# Patient Record
Sex: Female | Born: 1965
Health system: Southern US, Community
[De-identification: ages and names within clinical notes are randomized; demographics above are authoritative.]

## PROBLEM LIST (undated history)

## (undated) ENCOUNTER — Inpatient Hospital Stay (HOSPITAL_COMMUNITY): Payer: Self-pay

## (undated) DIAGNOSIS — K429 Umbilical hernia without obstruction or gangrene: Secondary | ICD-10-CM

## (undated) DIAGNOSIS — D219 Benign neoplasm of connective and other soft tissue, unspecified: Secondary | ICD-10-CM

## (undated) HISTORY — DX: Umbilical hernia without obstruction or gangrene: K42.9

---

## 2001-11-13 ENCOUNTER — Encounter: Payer: Self-pay | Admitting: Emergency Medicine

## 2001-11-13 ENCOUNTER — Emergency Department (HOSPITAL_COMMUNITY): Admission: EM | Admit: 2001-11-13 | Discharge: 2001-11-13 | Payer: Self-pay

## 2001-11-21 ENCOUNTER — Emergency Department (HOSPITAL_COMMUNITY): Admission: EM | Admit: 2001-11-21 | Discharge: 2001-11-21 | Payer: Self-pay | Admitting: Emergency Medicine

## 2002-07-25 ENCOUNTER — Emergency Department (HOSPITAL_COMMUNITY): Admission: EM | Admit: 2002-07-25 | Discharge: 2002-07-25 | Payer: Self-pay | Admitting: Emergency Medicine

## 2002-09-17 ENCOUNTER — Inpatient Hospital Stay (HOSPITAL_COMMUNITY): Admission: AD | Admit: 2002-09-17 | Discharge: 2002-09-17 | Payer: Self-pay | Admitting: *Deleted

## 2002-09-17 ENCOUNTER — Encounter: Payer: Self-pay | Admitting: *Deleted

## 2002-09-18 ENCOUNTER — Inpatient Hospital Stay (HOSPITAL_COMMUNITY): Admission: AD | Admit: 2002-09-18 | Discharge: 2002-09-18 | Payer: Self-pay | Admitting: Obstetrics and Gynecology

## 2002-09-19 ENCOUNTER — Inpatient Hospital Stay (HOSPITAL_COMMUNITY): Admission: AD | Admit: 2002-09-19 | Discharge: 2002-09-19 | Payer: Self-pay | Admitting: Obstetrics & Gynecology

## 2002-09-21 ENCOUNTER — Inpatient Hospital Stay (HOSPITAL_COMMUNITY): Admission: AD | Admit: 2002-09-21 | Discharge: 2002-09-21 | Payer: Self-pay | Admitting: Obstetrics and Gynecology

## 2002-09-26 ENCOUNTER — Encounter: Admission: RE | Admit: 2002-09-26 | Discharge: 2002-09-26 | Payer: Self-pay | Admitting: Obstetrics and Gynecology

## 2002-10-01 ENCOUNTER — Encounter: Admission: RE | Admit: 2002-10-01 | Discharge: 2002-10-01 | Payer: Self-pay | Admitting: Obstetrics and Gynecology

## 2002-10-15 ENCOUNTER — Encounter: Admission: RE | Admit: 2002-10-15 | Discharge: 2002-10-15 | Payer: Self-pay | Admitting: Obstetrics and Gynecology

## 2002-12-09 ENCOUNTER — Emergency Department (HOSPITAL_COMMUNITY): Admission: EM | Admit: 2002-12-09 | Discharge: 2002-12-09 | Payer: Self-pay | Admitting: Emergency Medicine

## 2003-02-03 ENCOUNTER — Inpatient Hospital Stay (HOSPITAL_COMMUNITY): Admission: AD | Admit: 2003-02-03 | Discharge: 2003-02-03 | Payer: Self-pay | Admitting: *Deleted

## 2003-02-04 ENCOUNTER — Inpatient Hospital Stay (HOSPITAL_COMMUNITY): Admission: AD | Admit: 2003-02-04 | Discharge: 2003-02-04 | Payer: Self-pay | Admitting: *Deleted

## 2003-02-15 ENCOUNTER — Inpatient Hospital Stay (HOSPITAL_COMMUNITY): Admission: AD | Admit: 2003-02-15 | Discharge: 2003-02-15 | Payer: Self-pay | Admitting: Obstetrics & Gynecology

## 2003-05-20 ENCOUNTER — Inpatient Hospital Stay (HOSPITAL_COMMUNITY): Admission: AD | Admit: 2003-05-20 | Discharge: 2003-05-20 | Payer: Self-pay | Admitting: Obstetrics

## 2003-07-29 ENCOUNTER — Inpatient Hospital Stay (HOSPITAL_COMMUNITY): Admission: AD | Admit: 2003-07-29 | Discharge: 2003-07-29 | Payer: Self-pay | Admitting: Obstetrics

## 2003-08-21 ENCOUNTER — Inpatient Hospital Stay (HOSPITAL_COMMUNITY): Admission: AD | Admit: 2003-08-21 | Discharge: 2003-08-21 | Payer: Self-pay | Admitting: Obstetrics & Gynecology

## 2003-08-23 ENCOUNTER — Inpatient Hospital Stay (HOSPITAL_COMMUNITY): Admission: AD | Admit: 2003-08-23 | Discharge: 2003-08-26 | Payer: Self-pay | Admitting: Obstetrics

## 2003-08-24 ENCOUNTER — Encounter (INDEPENDENT_AMBULATORY_CARE_PROVIDER_SITE_OTHER): Payer: Self-pay | Admitting: Specialist

## 2004-08-09 ENCOUNTER — Emergency Department (HOSPITAL_COMMUNITY): Admission: EM | Admit: 2004-08-09 | Discharge: 2004-08-09 | Payer: Self-pay | Admitting: Emergency Medicine

## 2004-09-03 ENCOUNTER — Emergency Department (HOSPITAL_COMMUNITY): Admission: EM | Admit: 2004-09-03 | Discharge: 2004-09-03 | Payer: Self-pay | Admitting: Emergency Medicine

## 2004-12-07 ENCOUNTER — Emergency Department (HOSPITAL_COMMUNITY): Admission: EM | Admit: 2004-12-07 | Discharge: 2004-12-07 | Payer: Self-pay | Admitting: Family Medicine

## 2006-04-29 ENCOUNTER — Emergency Department (HOSPITAL_COMMUNITY): Admission: EM | Admit: 2006-04-29 | Discharge: 2006-04-29 | Payer: Self-pay | Admitting: Emergency Medicine

## 2007-05-29 ENCOUNTER — Encounter: Admission: RE | Admit: 2007-05-29 | Discharge: 2007-05-29 | Payer: Self-pay | Admitting: Internal Medicine

## 2008-03-29 ENCOUNTER — Inpatient Hospital Stay (HOSPITAL_COMMUNITY): Admission: AD | Admit: 2008-03-29 | Discharge: 2008-03-29 | Payer: Self-pay | Admitting: Obstetrics & Gynecology

## 2008-08-01 ENCOUNTER — Ambulatory Visit (HOSPITAL_COMMUNITY): Admission: RE | Admit: 2008-08-01 | Discharge: 2008-08-01 | Payer: Self-pay | Admitting: Obstetrics

## 2009-06-30 ENCOUNTER — Emergency Department (HOSPITAL_COMMUNITY): Admission: EM | Admit: 2009-06-30 | Discharge: 2009-06-30 | Payer: Self-pay | Admitting: Emergency Medicine

## 2009-07-03 ENCOUNTER — Emergency Department (HOSPITAL_COMMUNITY)
Admission: EM | Admit: 2009-07-03 | Discharge: 2009-07-03 | Payer: Self-pay | Source: Home / Self Care | Admitting: Emergency Medicine

## 2009-07-03 ENCOUNTER — Emergency Department (HOSPITAL_COMMUNITY)
Admission: EM | Admit: 2009-07-03 | Discharge: 2009-07-03 | Payer: Self-pay | Source: Home / Self Care | Admitting: Family Medicine

## 2009-07-08 ENCOUNTER — Emergency Department (HOSPITAL_COMMUNITY)
Admission: EM | Admit: 2009-07-08 | Discharge: 2009-07-08 | Payer: Self-pay | Source: Home / Self Care | Admitting: Emergency Medicine

## 2010-01-31 ENCOUNTER — Encounter: Payer: Self-pay | Admitting: Obstetrics & Gynecology

## 2010-02-01 ENCOUNTER — Encounter: Payer: Self-pay | Admitting: Obstetrics

## 2010-03-15 ENCOUNTER — Inpatient Hospital Stay (INDEPENDENT_AMBULATORY_CARE_PROVIDER_SITE_OTHER)
Admission: RE | Admit: 2010-03-15 | Discharge: 2010-03-15 | Disposition: A | Discharge status: Home / Self Care | Payer: 59 | Source: Ambulatory Visit | Attending: Family Medicine | Admitting: Family Medicine

## 2010-03-15 DIAGNOSIS — M25519 Pain in unspecified shoulder: Secondary | ICD-10-CM

## 2010-03-15 DIAGNOSIS — M542 Cervicalgia: Secondary | ICD-10-CM

## 2010-03-28 LAB — POCT I-STAT, CHEM 8
BUN: 16 mg/dL (ref 6–23)
Creatinine, Ser: 1 mg/dL (ref 0.4–1.2)
Glucose, Bld: 99 mg/dL (ref 70–99)
HCT: 38 % (ref 36.0–46.0)
Hemoglobin: 12.9 g/dL (ref 12.0–15.0)
Hemoglobin: 13.3 g/dL (ref 12.0–15.0)
Potassium: 3.9 mEq/L (ref 3.5–5.1)
Potassium: 3.9 mEq/L (ref 3.5–5.1)
Sodium: 139 mEq/L (ref 135–145)
Sodium: 138 mEq/L (ref 135–145)

## 2010-03-28 LAB — DIFFERENTIAL
Basophils Absolute: 0 10*3/uL (ref 0.0–0.1)
Eosinophils Relative: 4 % (ref 0–5)
Lymphocytes Relative: 19 % (ref 12–46)
Lymphocytes Relative: 22 % (ref 12–46)
Lymphs Abs: 1.1 10*3/uL (ref 0.7–4.0)
Lymphs Abs: 1.3 10*3/uL (ref 0.7–4.0)
Monocytes Absolute: 0.4 10*3/uL (ref 0.1–1.0)
Monocytes Absolute: 0.4 10*3/uL (ref 0.1–1.0)
Monocytes Relative: 7 % (ref 3–12)
Monocytes Relative: 7 % (ref 3–12)
Neutrophils Relative %: 67 % (ref 43–77)
Neutrophils Relative %: 69 % (ref 43–77)

## 2010-03-28 LAB — URINALYSIS, ROUTINE W REFLEX MICROSCOPIC
Ketones, ur: NEGATIVE mg/dL
Leukocytes, UA: NEGATIVE
Protein, ur: NEGATIVE mg/dL
Urobilinogen, UA: 0.2 mg/dL (ref 0.0–1.0)
pH: 6 (ref 5.0–8.0)

## 2010-03-28 LAB — HEPATIC FUNCTION PANEL
AST: 22 U/L (ref 0–37)
Albumin: 3.7 g/dL (ref 3.5–5.2)
Alkaline Phosphatase: 56 U/L (ref 39–117)
Total Bilirubin: 0.6 mg/dL (ref 0.3–1.2)

## 2010-03-28 LAB — URINE MICROSCOPIC-ADD ON

## 2010-03-28 LAB — CBC
HCT: 35.5 % — ABNORMAL LOW (ref 36.0–46.0)
MCV: 92.1 fL (ref 78.0–100.0)
RDW: 12.1 % (ref 11.5–15.5)
WBC: 5.7 10*3/uL (ref 4.0–10.5)

## 2010-03-28 LAB — BASIC METABOLIC PANEL
BUN: 11 mg/dL (ref 6–23)
Creatinine, Ser: 0.91 mg/dL (ref 0.4–1.2)
Glucose, Bld: 88 mg/dL (ref 70–99)

## 2010-04-30 ENCOUNTER — Other Ambulatory Visit: Payer: Self-pay | Admitting: Family Medicine

## 2010-04-30 DIAGNOSIS — M5412 Radiculopathy, cervical region: Secondary | ICD-10-CM

## 2010-05-10 ENCOUNTER — Ambulatory Visit
Admission: RE | Admit: 2010-05-10 | Discharge: 2010-05-10 | Disposition: A | Discharge status: Home / Self Care | Payer: 59 | Source: Ambulatory Visit | Attending: Family Medicine | Admitting: Family Medicine

## 2010-05-10 DIAGNOSIS — M5412 Radiculopathy, cervical region: Secondary | ICD-10-CM

## 2010-05-13 ENCOUNTER — Other Ambulatory Visit (HOSPITAL_COMMUNITY)
Admission: RE | Admit: 2010-05-13 | Discharge: 2010-05-13 | Disposition: A | Discharge status: Home / Self Care | Payer: 59 | Source: Ambulatory Visit | Attending: Family Medicine | Admitting: Family Medicine

## 2010-05-13 ENCOUNTER — Other Ambulatory Visit: Payer: Self-pay | Admitting: Family Medicine

## 2010-05-13 DIAGNOSIS — Z124 Encounter for screening for malignant neoplasm of cervix: Secondary | ICD-10-CM | POA: Insufficient documentation

## 2010-05-13 DIAGNOSIS — Z1159 Encounter for screening for other viral diseases: Secondary | ICD-10-CM | POA: Insufficient documentation

## 2013-12-14 ENCOUNTER — Emergency Department (HOSPITAL_COMMUNITY): Payer: Medicaid Other

## 2013-12-14 ENCOUNTER — Emergency Department (HOSPITAL_COMMUNITY)
Admission: EM | Admit: 2013-12-14 | Discharge: 2013-12-14 | Disposition: A | Discharge status: Home / Self Care | Payer: Medicaid Other | Attending: Emergency Medicine | Admitting: Emergency Medicine

## 2013-12-14 ENCOUNTER — Encounter (HOSPITAL_COMMUNITY): Payer: Self-pay

## 2013-12-14 DIAGNOSIS — R102 Pelvic and perineal pain: Secondary | ICD-10-CM

## 2013-12-14 DIAGNOSIS — D259 Leiomyoma of uterus, unspecified: Secondary | ICD-10-CM | POA: Diagnosis not present

## 2013-12-14 DIAGNOSIS — Z3202 Encounter for pregnancy test, result negative: Secondary | ICD-10-CM | POA: Diagnosis not present

## 2013-12-14 LAB — URINALYSIS, ROUTINE W REFLEX MICROSCOPIC
Bilirubin Urine: NEGATIVE
GLUCOSE, UA: NEGATIVE mg/dL
Ketones, ur: NEGATIVE mg/dL
Nitrite: NEGATIVE
Protein, ur: NEGATIVE mg/dL
SPECIFIC GRAVITY, URINE: 1.018 (ref 1.005–1.030)
UROBILINOGEN UA: 1 mg/dL (ref 0.0–1.0)
pH: 6.5 (ref 5.0–8.0)

## 2013-12-14 LAB — PREGNANCY, URINE: PREG TEST UR: NEGATIVE

## 2013-12-14 LAB — URINE MICROSCOPIC-ADD ON

## 2013-12-14 MED ORDER — IBUPROFEN 800 MG PO TABS
800.0000 mg | ORAL_TABLET | Freq: Once | ORAL | Status: AC
  Administered 2013-12-14: 800 mg via ORAL
  Filled 2013-12-14: qty 1

## 2013-12-14 MED ORDER — ACETAMINOPHEN 325 MG PO TABS
650.0000 mg | ORAL_TABLET | Freq: Once | ORAL | Status: AC
  Administered 2013-12-14: 650 mg via ORAL

## 2013-12-14 NOTE — ED Notes (Signed)
Pt back from ultrasound.

## 2013-12-14 NOTE — ED Notes (Signed)
Patient is in restroom at this time

## 2013-12-14 NOTE — ED Notes (Signed)
Patient transported to Ultrasound 

## 2013-12-14 NOTE — ED Provider Notes (Signed)
CSN: [id number]     Arrival date & time 12/14/13  1004 History   First MD Initiated Contact with Patient 12/14/13 1008     Chief Complaint  Patient presents with  . Abdominal Pain     (Consider location/radiation/quality/duration/timing/severity/associated sxs/prior Treatment) HPI Comments: 48 year old female with 3 vaginal delivery history, no abdominal surgeries presents with left lower pelvic and suprapubic pain for 1 week. Patient has had urinary frequency. Mild discomfort with sexual intercourse, no new sexual partners or STDs, no abnormal discharge or odor. Pain is mild to moderate fairly constant ache, worse with palpation.  Patient is a 48 y.o. female presenting with abdominal pain. The history is provided by the patient.  Abdominal Pain Associated symptoms: no chest pain, no chills, no diarrhea, no dysuria, no fever, no nausea, no shortness of breath, no vaginal bleeding and no vomiting     History reviewed. No pertinent past medical history. History reviewed. No pertinent past surgical history. History reviewed. No pertinent family history. History  Substance Use Topics  . Smoking status: Never Smoker   . Smokeless tobacco: Not on file  . Alcohol Use: No   OB History    No data available     Review of Systems  Constitutional: Negative for fever and chills.  HENT: Negative for congestion.   Eyes: Negative for visual disturbance.  Respiratory: Negative for shortness of breath.   Cardiovascular: Negative for chest pain.  Gastrointestinal: Positive for abdominal pain. Negative for nausea, vomiting and diarrhea.  Genitourinary: Positive for frequency and pelvic pain. Negative for dysuria, flank pain and vaginal bleeding.  Musculoskeletal: Negative for back pain, neck pain and neck stiffness.  Skin: Negative for rash.  Neurological: Negative for light-headedness and headaches.      Allergies  Review of patient's allergies indicates no known allergies.  Home  Medications   Prior to Admission medications   Not on File   BP 126/72 mmHg  Pulse 58  Temp(Src) 97.9 F (36.6 C) (Oral)  Resp 18  SpO2 100%  LMP 12/09/2013 (Approximate) Physical Exam  Constitutional: She is oriented to person, place, and time. She appears well-developed and well-nourished.  HENT:  Head: Normocephalic and atraumatic.  Eyes: Conjunctivae are normal. Right eye exhibits no discharge. Left eye exhibits no discharge.  Neck: Normal range of motion. Neck supple. No tracheal deviation present.  Cardiovascular: Normal rate and regular rhythm.   Pulmonary/Chest: Effort normal and breath sounds normal.  Abdominal: Soft. She exhibits no distension. There is tenderness (left pelvic and suprapubic). There is no guarding.  Musculoskeletal: She exhibits no edema.  Neurological: She is alert and oriented to person, place, and time.  Skin: Skin is warm. No rash noted.  Psychiatric: She has a normal mood and affect.  Nursing note and vitals reviewed.   ED Course  Procedures (including critical care time) Labs Review Labs Reviewed  URINALYSIS, ROUTINE W REFLEX MICROSCOPIC - Abnormal; Notable for the following:    APPearance CLOUDY (*)    Hgb urine dipstick MODERATE (*)    Leukocytes, UA SMALL (*)    All other components within normal limits  URINE MICROSCOPIC-ADD ON - Abnormal; Notable for the following:    Squamous Epithelial / LPF FEW (*)    All other components within normal limits  PREGNANCY, URINE    Imaging Review US Transvaginal Non-ob  12/14/2013   CLINICAL DATA:  Acute left pelvic pain for 1 week  EXAM: TRANSABDOMINAL AND TRANSVAGINAL ULTRASOUND OF PELVIS  TECHNIQUE: Both transabdominal and transvaginal  ultrasound examinations of the pelvis were performed. Transabdominal technique was performed for global imaging of the pelvis including uterus, ovaries, adnexal regions, and pelvic cul-de-sac. It was necessary to proceed with endovaginal exam following the  transabdominal exam to visualize the uterus and adnexae in better detail.  COMPARISON:  07/08/2009, 05/25/2010  FINDINGS: Uterus  Measurements: 14 x 9.3 x 9.9 cm. Diffuse heterogeneous enlargement. Two heterogeneous dominant fibroids noted. 1 in the fundus measures 9.5 x 9.4 x 0.5 cm. This is the largest fibroid. Second measurable uterine fibroid is right fundal measuring 4.7 x 4.4 x 4.4 cm. These appear larger than on prior studies. Cervical nabothian cysts evident.  Endometrium  Thickness: Not visualized.  Obscured by the fibroids.  Right ovary  Measurements: 1.4 x 0.9 x 1.1 cm. Normal appearance/no adnexal mass.  Left ovary  Measurements: 3.9 x 1.5 x 3.3 cm. Grossly normal appearance. Only seen transabdominally.  Other findings  No free fluid.  IMPRESSION: Enlarged fibroid uterus. Uterine fibroids appear larger than the 2 comparison studies.  No other significant acute process by ultrasound.   Electronically Signed   By: Daryll Brod M.D.   On: 12/14/2013 12:16   US Pelvis Complete  12/14/2013   CLINICAL DATA:  Acute left pelvic pain for 1 week  EXAM: TRANSABDOMINAL AND TRANSVAGINAL ULTRASOUND OF PELVIS  TECHNIQUE: Both transabdominal and transvaginal ultrasound examinations of the pelvis were performed. Transabdominal technique was performed for global imaging of the pelvis including uterus, ovaries, adnexal regions, and pelvic cul-de-sac. It was necessary to proceed with endovaginal exam following the transabdominal exam to visualize the uterus and adnexae in better detail.  COMPARISON:  07/08/2009, 05/25/2010  FINDINGS: Uterus  Measurements: 14 x 9.3 x 9.9 cm. Diffuse heterogeneous enlargement. Two heterogeneous dominant fibroids noted. 1 in the fundus measures 9.5 x 9.4 x 0.5 cm. This is the largest fibroid. Second measurable uterine fibroid is right fundal measuring 4.7 x 4.4 x 4.4 cm. These appear larger than on prior studies. Cervical nabothian cysts evident.  Endometrium  Thickness: Not visualized.   Obscured by the fibroids.  Right ovary  Measurements: 1.4 x 0.9 x 1.1 cm. Normal appearance/no adnexal mass.  Left ovary  Measurements: 3.9 x 1.5 x 3.3 cm. Grossly normal appearance. Only seen transabdominally.  Other findings  No free fluid.  IMPRESSION: Enlarged fibroid uterus. Uterine fibroids appear larger than the 2 comparison studies.  No other significant acute process by ultrasound.   Electronically Signed   By: Daryll Brod M.D.   On: 12/14/2013 12:16     EKG Interpretation None      MDM   Final diagnoses:  Pelvic pain in female  Uterine leiomyoma, unspecified location   Well-appearing female with urinary and pelvic symptoms for 1 week. Benign abdominal exam. Plan for pelvic ultrasound, urinalysis, pain meds and pelvic exam. Discussed outpatient follow up with gynecology.  Results and differential diagnosis were discussed with the patient/parent/guardian. Close follow up outpatient was discussed, comfortable with the plan.   Medications  acetaminophen (TYLENOL) tablet 650 mg (650 mg Oral Given 12/14/13 1032)  ibuprofen (ADVIL,MOTRIN) tablet 800 mg (800 mg Oral Given 12/14/13 1241)    Filed Vitals:   12/14/13 1015 12/14/13 1239  BP: 125/60 126/72  Pulse: 65 58  Temp: 97.9 F (36.6 C) 97.9 F (36.6 C)  TempSrc: Oral Oral  Resp: 16 18  SpO2: 100% 100%    Final diagnoses:  Pelvic pain in female  Uterine leiomyoma, unspecified location  Mariea Clonts, MD 12/14/13 [phone]

## 2013-12-14 NOTE — Discharge Instructions (Signed)
If you were given medicines take as directed.  If you are on coumadin or contraceptives realize their levels and effectiveness is altered by many different medicines.  If you have any reaction (rash, tongues swelling, other) to the medicines stop taking and see a physician.   Please follow up as directed and return to the ER or see a physician for new or worsening symptoms.  Thank you. Filed Vitals:   12/14/13 1015  BP: 125/60  Pulse: 65  Temp: 97.9 F (36.6 C)  TempSrc: Oral  Resp: 16  SpO2: 100%

## 2013-12-14 NOTE — ED Notes (Addendum)
Pt c/o LLQ abdominal discomfort, "knot," and urinary frequency x 1 week.  Pain score 7/10.  Denies n/v/d and odor and discharge.  Sts discomfort w/ intercourse.  Denies new sexual partner.  Pt sts Hx of "fallen bladder."

## 2014-05-07 LAB — PROCEDURE REPORT - SCANNED: Pap: NEGATIVE

## 2014-05-14 ENCOUNTER — Other Ambulatory Visit: Payer: Self-pay | Admitting: Obstetrics

## 2014-05-14 DIAGNOSIS — D259 Leiomyoma of uterus, unspecified: Secondary | ICD-10-CM

## 2014-05-15 ENCOUNTER — Other Ambulatory Visit: Payer: Self-pay | Admitting: Obstetrics

## 2014-05-15 ENCOUNTER — Encounter: Payer: Self-pay | Admitting: Radiology

## 2014-05-15 ENCOUNTER — Ambulatory Visit
Admission: RE | Admit: 2014-05-15 | Discharge: 2014-05-15 | Disposition: A | Discharge status: Home / Self Care | Payer: Medicaid Other | Source: Ambulatory Visit | Attending: Obstetrics | Admitting: Obstetrics

## 2014-05-15 ENCOUNTER — Other Ambulatory Visit: Payer: Self-pay | Admitting: Interventional Radiology

## 2014-05-15 DIAGNOSIS — D259 Leiomyoma of uterus, unspecified: Secondary | ICD-10-CM

## 2014-05-15 DIAGNOSIS — D251 Intramural leiomyoma of uterus: Secondary | ICD-10-CM

## 2014-05-15 HISTORY — DX: Benign neoplasm of connective and other soft tissue, unspecified: D21.9

## 2014-05-15 MED ORDER — GADOBENATE DIMEGLUMINE 529 MG/ML IV SOLN
15.0000 mL | Freq: Once | INTRAVENOUS | Status: AC | PRN
  Administered 2014-05-15: 15 mL via INTRAVENOUS

## 2014-05-15 NOTE — Consult Note (Signed)
Chief Complaint: Chief Complaint  Patient presents with  . Advice Only    Consult for [place] for symptomatic uterine fibroids    Referring Physician(s): Maria Logan  History of Present Illness: Maria Logan is Logan 49 y.o. female referred by Dr. Ruthann Logan for consultation regarding her symptomatic uterine fibroids. She is diagnosed with uterine fibroids and December 2015. She's having primarily bulk symptoms, no significant abnormal uterine bleeding. She's had no previous fibroid surgeries.  Past Medical History  Diagnosis Date  . Fibroids     No past surgical history on file.  Allergies: Review of patient's allergies indicates no known allergies.  Medications: Prior to Admission medications   Not on File     No family history on file.  History   Social History  . Marital Status: Single    Spouse Name: N/Logan  . Number of Children: N/Logan  . Years of Education: N/Logan   Social History Main Topics  . Smoking status: Never Smoker   . Smokeless tobacco: Never Used  . Alcohol Use: No  . Drug Use: No  . Sexual Activity: Yes    Birth Control/ Protection: None   Other Topics Concern  . None   Social History Narrative    ECOG Status: 1 - Symptomatic but completely ambulatory  Review of Systems: Logan 12 point ROS discussed and pertinent positives are indicated in the HPI above.  All other systems are negative.  Review of Systems  All other systems reviewed and are negative.   Vital Signs: BP 93/63 mmHg  Pulse 133  Temp(Src) 98.3 F (36.8 C) (Oral)  Resp 14  Ht 5\' 6"  (1.676 m)  Wt 175 lb (79.379 kg)  BMI 28.26 kg/m2  SpO2 100%  LMP 05/07/2014 (Approximate)  Physical Exam  Constitutional: She is oriented to person, place, and time. She appears well-developed and well-nourished. No distress.  HENT:  Head: Normocephalic and atraumatic.  Eyes: EOM are normal. Right eye exhibits no discharge. Left eye exhibits no discharge. No scleral icterus.  Neck:  Normal range of motion.  Pulmonary/Chest: Effort normal. No stridor. No respiratory distress.  Abdominal: Soft. She exhibits no distension.  Musculoskeletal: Normal range of motion.  Neurological: She is alert and oriented to person, place, and time. She has normal reflexes. Coordination normal.  Skin: Skin is warm and dry. No rash noted. She is not diaphoretic. No erythema.  Psychiatric: She has Logan normal mood and affect. Her behavior is normal. Judgment and thought content normal.  Nursing note and vitals reviewed.   Mallampati Score:     Imaging: No results found.  Labs:  CBC: No results for input(s): WBC, HGB, HCT, PLT in the last 8760 hours.  COAGS: No results for input(s): INR, APTT in the last 8760 hours.  BMP: No results for input(s): NA, K, CL, CO2, GLUCOSE, BUN, CALCIUM, CREATININE, GFRNONAA, GFRAA in the last 8760 hours.  Invalid input(s): CMP  LIVER FUNCTION TESTS: No results for input(s): BILITOT, AST, ALT, ALKPHOS, PROT, ALBUMIN in the last 8760 hours.  TUMOR MARKERS: No results for input(s): AFPTM, CEA, CA199, CHROMGRNA in the last 8760 hours.  Assessment and Plan:  Based on her assessment thus far, I think it's likely that some if not all of her pelvic bulk symptoms are secondary to her enlarged uterus secondary to multiple fibroids. We  spent the majority of the consultation discussing the pathophysiology of uterine fibroids, natural history including involution post menopause, and treatment options. We discussed hysterectomy. We  discussed in detail the uterine fibroid embolization procedure, technique, anticipated benefits, possible risks and complications, time course of symptom resolution, and need for continued gynecologic follow-up. We discussed the statistics regarding  pain response post procedure. She seemed to understand and did ask appropriate questions. She was very motivated to proceed. Based on her workup thus far, I would only require pelvic MR with  contrast to best assess the exact position of all of her uterine fibroids, specifically to exclude pedunculated fibroids  on Logan narrow stalk , as well as to exclude any other pelvic pathology. Assuming this is unremarkable, we can set up elective uterine fibroid embolization at [facility] at her convenience, with plans for overnight admission for pain control.  Thank you for this interesting consult.  I greatly enjoyed meeting Maria Logan and look forward to participating in their care.  Signed: Nikitha Mode III, Maria Logan 05/15/2014, 2:16 PM   I spent Logan total of  30 Minutes   in face to face in clinical consultation, greater than 50% of which was counseling/coordinating care for symptomatic uterine fibroids.

## 2014-05-16 ENCOUNTER — Other Ambulatory Visit: Payer: Self-pay | Admitting: Interventional Radiology

## 2014-05-16 DIAGNOSIS — D251 Intramural leiomyoma of uterus: Secondary | ICD-10-CM

## 2014-05-26 ENCOUNTER — Other Ambulatory Visit: Payer: Self-pay | Admitting: Physician Assistant

## 2014-05-28 ENCOUNTER — Ambulatory Visit (HOSPITAL_COMMUNITY)
Admission: RE | Admit: 2014-05-28 | Discharge: 2014-05-28 | Disposition: A | Discharge status: Home / Self Care | Payer: Medicaid Other | Source: Ambulatory Visit | Attending: Interventional Radiology | Admitting: Interventional Radiology

## 2014-05-28 ENCOUNTER — Encounter (HOSPITAL_COMMUNITY): Payer: Self-pay

## 2014-05-28 DIAGNOSIS — D252 Subserosal leiomyoma of uterus: Secondary | ICD-10-CM | POA: Diagnosis present

## 2014-05-28 DIAGNOSIS — Z5309 Procedure and treatment not carried out because of other contraindication: Secondary | ICD-10-CM | POA: Diagnosis not present

## 2014-05-28 DIAGNOSIS — Z3201 Encounter for pregnancy test, result positive: Secondary | ICD-10-CM | POA: Insufficient documentation

## 2014-05-28 DIAGNOSIS — D250 Submucous leiomyoma of uterus: Secondary | ICD-10-CM | POA: Diagnosis not present

## 2014-05-28 DIAGNOSIS — D251 Intramural leiomyoma of uterus: Secondary | ICD-10-CM | POA: Insufficient documentation

## 2014-05-28 DIAGNOSIS — R102 Pelvic and perineal pain: Secondary | ICD-10-CM | POA: Diagnosis not present

## 2014-05-28 LAB — BASIC METABOLIC PANEL
ANION GAP: 8 (ref 5–15)
BUN: 10 mg/dL (ref 6–20)
CALCIUM: 8.9 mg/dL (ref 8.9–10.3)
CO2: 25 mmol/L (ref 22–32)
CREATININE: 0.78 mg/dL (ref 0.44–1.00)
Chloride: 105 mmol/L (ref 101–111)
Glucose, Bld: 94 mg/dL (ref 65–99)
Potassium: 3.9 mmol/L (ref 3.5–5.1)
Sodium: 138 mmol/L (ref 135–145)

## 2014-05-28 LAB — PROTIME-INR
INR: 1.05 (ref 0.00–1.49)
PROTHROMBIN TIME: 13.8 s (ref 11.6–15.2)

## 2014-05-28 LAB — CBC WITH DIFFERENTIAL/PLATELET
BASOS ABS: 0 10*3/uL (ref 0.0–0.1)
BASOS PCT: 0 % (ref 0–1)
Eosinophils Absolute: 0 10*3/uL (ref 0.0–0.7)
Eosinophils Relative: 0 % (ref 0–5)
HCT: 35.4 % — ABNORMAL LOW (ref 36.0–46.0)
Hemoglobin: 11.6 g/dL — ABNORMAL LOW (ref 12.0–15.0)
Lymphocytes Relative: 8 % — ABNORMAL LOW (ref 12–46)
Lymphs Abs: 1 10*3/uL (ref 0.7–4.0)
MCH: 30.1 pg (ref 26.0–34.0)
MCHC: 32.8 g/dL (ref 30.0–36.0)
MCV: 91.7 fL (ref 78.0–100.0)
MONOS PCT: 10 % (ref 3–12)
Monocytes Absolute: 1.2 10*3/uL — ABNORMAL HIGH (ref 0.1–1.0)
NEUTROS ABS: 10.2 10*3/uL — AB (ref 1.7–7.7)
NEUTROS PCT: 82 % — AB (ref 43–77)
PLATELETS: 242 10*3/uL (ref 150–400)
RBC: 3.86 MIL/uL — ABNORMAL LOW (ref 3.87–5.11)
RDW: 12.8 % (ref 11.5–15.5)
WBC: 12.4 10*3/uL — ABNORMAL HIGH (ref 4.0–10.5)

## 2014-05-28 LAB — HCG, SERUM, QUALITATIVE: Preg, Serum: POSITIVE — AB

## 2014-05-28 LAB — HCG, QUANTITATIVE, PREGNANCY: HCG, BETA CHAIN, QUANT, S: 17051 m[IU]/mL — AB (ref <5)

## 2014-05-28 MED ORDER — KETOROLAC TROMETHAMINE 30 MG/ML IJ SOLN
30.0000 mg | INTRAMUSCULAR | Status: AC
  Administered 2014-05-28: 30 mg via INTRAVENOUS
  Filled 2014-05-28: qty 1

## 2014-05-28 MED ORDER — CEFAZOLIN SODIUM-DEXTROSE 2-3 GM-% IV SOLR
2.0000 g | INTRAVENOUS | Status: DC
  Filled 2014-05-28: qty 50

## 2014-05-28 MED ORDER — SODIUM CHLORIDE 0.9 % IV SOLN
INTRAVENOUS | Status: DC
  Administered 2014-05-28: 08:00:00 via INTRAVENOUS

## 2014-05-28 NOTE — Progress Notes (Signed)
Dr. Vernard Gambles and Rowe Robert PA informed patient of positive pregnancy test. Patient instructed to call her gyn office when she gets home today for an appt next Monday. IV and catheter discontinued. Friend Tiffany at bedside and patient discharged to home.

## 2014-05-28 NOTE — H&P (Signed)
Chief Complaint: Uterine fibroids, pelvic pain  Referring Physician(s): Hassell,Daniel  History of Present Illness: Maria Logan is a 49 y.o. female with history of symptomatic uterine fibroids who presents today for elective bilateral uterine artery embolization. She was recently seen in consultation by Dr. Vernard Gambles for the above procedure.                                                                                                                                                                                                              Past Medical History  Diagnosis Date  . Fibroids     History reviewed. No pertinent past surgical history.  Allergies: Review of patient's allergies indicates no known allergies.  Medications: Prior to Admission medications   Medication Sig Start Date End Date Taking? Authorizing Provider  cholecalciferol (VITAMIN D) 1000 UNITS tablet Take 1,000 Units by mouth daily.   Yes Historical Provider, MD  ibuprofen (ADVIL,MOTRIN) 800 MG tablet Take 800 mg by mouth every 8 (eight) hours as needed for headache or moderate pain.   Yes Historical Provider, MD    History reviewed. No pertinent family history.  History   Social History  . Marital Status: Single    Spouse Name: N/A  . Number of Children: N/A  . Years of Education: N/A   Social History Main Topics  . Smoking status: Never Smoker   . Smokeless tobacco: Never Used  . Alcohol Use: No  . Drug Use: No  . Sexual Activity: Yes    Birth Control/ Protection: None   Other Topics Concern  . None   Social History Narrative      Review of Systems  Constitutional: Negative for fever and chills.  Respiratory: Negative for shortness of breath.        Occ cough  Cardiovascular: Negative for chest pain.  Gastrointestinal: Positive for abdominal pain. Negative for nausea, vomiting and blood in stool.  Genitourinary: Positive for pelvic pain.       Prior hx of mild  hematuria/dysuria- recently placed on cipro  Musculoskeletal: Negative for back pain.  Neurological: Negative for headaches.  Hematological: Does not bruise/bleed easily.    Vital Signs: BP 109/56 mmHg  Pulse 98  Temp(Src) 98 F (36.7 C) (Oral)  Resp 16  Ht 5\' 6"  (1.676 m)  Wt 175 lb (79.379 kg)  BMI 28.26 kg/m2  SpO2 99%  LMP 05/07/2014 (Approximate)  Physical Exam  Constitutional: She is oriented to person, place, and time. She appears well-developed and well-nourished.  Cardiovascular: Normal rate  and regular rhythm.   Pulmonary/Chest: Effort normal and breath sounds normal.  Abdominal: Soft. Bowel sounds are normal. There is tenderness.  Fibroid uterus  Musculoskeletal: Normal range of motion. She exhibits no edema.  Neurological: She is alert and oriented to person, place, and time.    Imaging: Mr Pelvis W Wo Contrast  05/15/2014   CLINICAL DATA:  Uterine fibroids, pre [place]  EXAM: MRI PELVIS WITHOUT AND WITH CONTRAST  TECHNIQUE: Multiplanar multisequence MR imaging of the pelvis was performed both before and after administration of intravenous contrast.  CONTRAST:  64mL MULTIHANCE GADOBENATE DIMEGLUMINE 529 MG/ML IV SOLN  COMPARISON:  Pelvic ultrasound dated 12/14/2013  FINDINGS: Enlarged uterus, measuring 26.0 x 10.1 x 13.7 cm.  Multiple uterine fibroids, including:  --Dominant 8.6 x 13.1 x 15.3 cm transmural fibroid in the posterior uterine fundus (series 5/image 10)  --5.7 x 4.2 x 7.1 cm subserosal fibroid in the right uterine fundus (series 5/image 11)  --2.5 x 1.5 x 1.5 cm submucosal fibroid in the left anterior uterine body (series 5/image 20)  --1.1 x 1.5 x 1.2 cm subserosal fibroid in the left anterior lower uterine body (series 5/image 22)  All fibroids enhance following contrast administration, although with only 60% enhancement of the dominant posterior fundal fibroid.  Endometrial complex measures 18 mm. Fluid within the lower uterine segment.  Left ovary is within normal  limits, measuring 2.1 x 1.4 x 2.4 cm (series 5/image 12).  Right ovary is within normal limits, measuring 2.2 x 1.5 x 2.4 cm (series 5/ image 21).  Trace pelvic ascites.  No suspicious pelvic lymphadenopathy.  3.1 cm left renal sinus cyst (series 2/ image 11). No hydronephrosis.  No focal osseous lesions.  IMPRESSION: Multiple uterine fibroids, including a 15.3 cm transmural fibroid in the posterior uterine fundus and a 2.5 cm submucosal fibroid in the left anterior uterine body.  Endometrial complex measures 18 mm. Fluid within the lower uterine segment.  Bilateral ovaries are within normal limits.   Electronically Signed   By: Julian Hy M.D.   On: 05/15/2014 15:39    Labs:  CBC:  Recent Labs  05/28/14 0800  WBC 12.4*  HGB 11.6*  HCT 35.4*  PLT 242   Results for orders placed or performed during the hospital encounter of 43/15/40  Basic metabolic panel  Result Value Ref Range   Sodium 138 135 - 145 mmol/L   Potassium 3.9 3.5 - 5.1 mmol/L   Chloride 105 101 - 111 mmol/L   CO2 25 22 - 32 mmol/L   Glucose, Bld 94 65 - 99 mg/dL   BUN 10 6 - 20 mg/dL   Creatinine, Ser 0.78 0.44 - 1.00 mg/dL   Calcium 8.9 8.9 - 10.3 mg/dL   GFR calc non Af Amer >60 >60 mL/min   GFR calc Af Amer >60 >60 mL/min   Anion gap 8 5 - 15  CBC with Differential/Platelet  Result Value Ref Range   WBC 12.4 (H) 4.0 - 10.5 K/uL   RBC 3.86 (L) 3.87 - 5.11 MIL/uL   Hemoglobin 11.6 (L) 12.0 - 15.0 g/dL   HCT 35.4 (L) 36.0 - 46.0 %   MCV 91.7 78.0 - 100.0 fL   MCH 30.1 26.0 - 34.0 pg   MCHC 32.8 30.0 - 36.0 g/dL   RDW 12.8 11.5 - 15.5 %   Platelets 242 150 - 400 K/uL   Neutrophils Relative % 82 (H) 43 - 77 %   Neutro Abs 10.2 (H) 1.7 -  7.7 K/uL   Lymphocytes Relative 8 (L) 12 - 46 %   Lymphs Abs 1.0 0.7 - 4.0 K/uL   Monocytes Relative 10 3 - 12 %   Monocytes Absolute 1.2 (H) 0.1 - 1.0 K/uL   Eosinophils Relative 0 0 - 5 %   Eosinophils Absolute 0.0 0.0 - 0.7 K/uL   Basophils Relative 0 0 - 1 %    Basophils Absolute 0.0 0.0 - 0.1 K/uL  hCG, serum, qualitative  Result Value Ref Range   Preg, Serum POSITIVE (A) NEGATIVE    COAGS: No results for input(s): INR, APTT in the last 8760 hours.  BMP: No results for input(s): NA, K, CL, CO2, GLUCOSE, BUN, CALCIUM, CREATININE, GFRNONAA, GFRAA in the last 8760 hours.  Invalid input(s): CMP  LIVER FUNCTION TESTS: No results for input(s): BILITOT, AST, ALT, ALKPHOS, PROT, ALBUMIN in the last 8760 hours.  TUMOR MARKERS: No results for input(s): AFPTM, CEA, CA199, CHROMGRNA in the last 8760 hours.  Assessment and Plan: Maria Logan is a 49 y.o. female with history of symptomatic uterine fibroids who presents today for elective bilateral uterine artery embolization. Patient's serum qualitative hCG is positive today. Pt informed.  Findings were discussed with Dr. Vernard Gambles [facility] Dr. Jodi Mourning who is covering for patient's gynecologist Dr. Ruthann Cancer. Fibroid embolization procedure has been canceled and patient was instructed to contact/follow-up with Dr. Ruthann Cancer on Monday (06/02/14).    Signed: D. Rowe Robert 05/28/2014, 8:36 AM   I spent a total of 30 minutes face to face in clinical consultation, greater than 50% of which was counseling/coordinating care for bilateral uterine artery embolization, subsequently canceled secondary to positive pregnancy test.

## 2014-05-28 NOTE — Progress Notes (Signed)
Patient stated she had been on antibiotics prior to coming in here due to blood in urine. States she thought it was Cipro and has one more day of it. White count elevated today. PA kevin allred made aware of both. Lab called back with positive blood serum pregnancy test. Made Alfred Levins IR tech aware and she was going to make Dr. Vernard Gambles aware now.

## 2014-05-29 ENCOUNTER — Inpatient Hospital Stay (HOSPITAL_COMMUNITY)
Admission: AD | Admit: 2014-05-29 | Discharge: 2014-05-29 | Disposition: A | Discharge status: Home / Self Care | Payer: Medicaid Other | Source: Ambulatory Visit | Attending: Obstetrics | Admitting: Obstetrics

## 2014-05-29 ENCOUNTER — Inpatient Hospital Stay (HOSPITAL_COMMUNITY): Payer: Medicaid Other

## 2014-05-29 ENCOUNTER — Encounter (HOSPITAL_COMMUNITY): Payer: Self-pay | Admitting: *Deleted

## 2014-05-29 DIAGNOSIS — R109 Unspecified abdominal pain: Secondary | ICD-10-CM | POA: Diagnosis present

## 2014-05-29 DIAGNOSIS — D251 Intramural leiomyoma of uterus: Secondary | ICD-10-CM | POA: Insufficient documentation

## 2014-05-29 DIAGNOSIS — O039 Complete or unspecified spontaneous abortion without complication: Secondary | ICD-10-CM | POA: Diagnosis not present

## 2014-05-29 DIAGNOSIS — O26899 Other specified pregnancy related conditions, unspecified trimester: Secondary | ICD-10-CM

## 2014-05-29 LAB — CBC
HCT: 33.1 % — ABNORMAL LOW (ref 36.0–46.0)
Hemoglobin: 11.3 g/dL — ABNORMAL LOW (ref 12.0–15.0)
MCH: 30.6 pg (ref 26.0–34.0)
MCHC: 34.1 g/dL (ref 30.0–36.0)
MCV: 89.7 fL (ref 78.0–100.0)
PLATELETS: 222 10*3/uL (ref 150–400)
RBC: 3.69 MIL/uL — AB (ref 3.87–5.11)
RDW: 12.7 % (ref 11.5–15.5)
WBC: 13.7 10*3/uL — ABNORMAL HIGH (ref 4.0–10.5)

## 2014-05-29 LAB — URINE MICROSCOPIC-ADD ON

## 2014-05-29 LAB — HCG, QUANTITATIVE, PREGNANCY: hCG, Beta Chain, Quant, S: 11220 m[IU]/mL — ABNORMAL HIGH (ref <5)

## 2014-05-29 LAB — URINALYSIS, ROUTINE W REFLEX MICROSCOPIC
Bilirubin Urine: NEGATIVE
Glucose, UA: NEGATIVE mg/dL
Ketones, ur: NEGATIVE mg/dL
Leukocytes, UA: NEGATIVE
Nitrite: NEGATIVE
PROTEIN: NEGATIVE mg/dL
Specific Gravity, Urine: 1.015 (ref 1.005–1.030)
Urobilinogen, UA: 1 mg/dL (ref 0.0–1.0)
pH: 6 (ref 5.0–8.0)

## 2014-05-29 MED ORDER — OXYCODONE HCL 5 MG PO TABS
10.0000 mg | ORAL_TABLET | Freq: Once | ORAL | Status: AC
  Administered 2014-05-29: 10 mg via ORAL

## 2014-05-29 MED ORDER — HYDROCODONE-ACETAMINOPHEN 5-325 MG PO TABS: 1.0000 | ORAL_TABLET | ORAL | Status: DC | PRN

## 2014-05-29 MED ORDER — GUAIFENESIN ER 600 MG PO TB12
600.0000 mg | ORAL_TABLET | Freq: Two times a day (BID) | ORAL | Status: DC
  Administered 2014-05-29: 600 mg via ORAL
  Filled 2014-05-29 (×2): qty 1

## 2014-05-29 MED ORDER — OXYCODONE HCL ER 10 MG PO T12A: 10.0000 mg | EXTENDED_RELEASE_TABLET | Freq: Once | ORAL | Status: DC

## 2014-05-29 MED ORDER — OXYCODONE HCL 5 MG PO TABS
10.0000 mg | ORAL_TABLET | Freq: Once | ORAL | Status: DC
Start: 2014-05-29 — End: 2014-05-29

## 2014-05-29 MED ORDER — ACETAMINOPHEN 325 MG PO TABS
650.0000 mg | ORAL_TABLET | Freq: Once | ORAL | Status: AC
  Administered 2014-05-29: 650 mg via ORAL
  Filled 2014-05-29: qty 2

## 2014-05-29 MED ORDER — IBUPROFEN 800 MG PO TABS: 800.0000 mg | ORAL_TABLET | Freq: Three times a day (TID) | ORAL | Status: DC | PRN

## 2014-05-29 NOTE — MAU Provider Note (Signed)
History     CSN: [id number]  Arrival date and time: 05/29/14 1825   First Provider Initiated Contact with Patient 05/29/14 1933      Chief Complaint  Patient presents with  . Shortness of Breath  . Back Pain  . Abdominal Pain   HPI Comments: Pt is 49 yo G4P3 who presents with abdominal pain and bleeding.  Pt has known fibroids and scheduled for embolization.  When pt went for her pre-op, she was told she was pregnant.   Pt also c/o of lower abd back pain with radiation down left leg Pt also c/o of cough- dry hacking cough  Abdominal Pain This is a new problem. The current episode started yesterday. The onset quality is gradual. The problem has been unchanged. The pain is located in the suprapubic region. The pain is at a severity of 10/10. The quality of the pain is cramping. Pertinent negatives include no fever, nausea or vomiting. Nothing aggravates the pain. The pain is relieved by nothing. She has tried nothing for the symptoms.  Vaginal Bleeding The patient's primary symptoms include vaginal bleeding. This is a new problem. The problem occurs constantly. The problem affects both sides. She is pregnant. Associated symptoms include abdominal pain and chills. Pertinent negatives include no fever, nausea or vomiting. The vaginal discharge was bloody. She has not been passing clots. She has not been passing tissue.    RN note: Eulas Post, RN Registered Nurse Signed  MAU Note 05/29/2014 6:51 PM    Expand All Collapse All   C/o SOB and cough for past 3 days, c/o lower abdominal pain for past 2 months- pain has increased this past few days; hx of fibroids; c/o intermittent lower back pain with shooting pain down through pt's buttocks for about month; pt went in for fibroid surgery and found out she was pregnant; ? gestation age;        Past Medical History  Diagnosis Date  . Fibroids     Past Surgical History  Procedure Laterality Date  . No past surgeries      Family History   Problem Relation Age of Onset  . Alcohol abuse Neg Hx   . Arthritis Neg Hx   . Asthma Neg Hx   . Birth defects Neg Hx   . Cancer Neg Hx   . COPD Neg Hx   . Depression Neg Hx   . Diabetes Neg Hx   . Drug abuse Neg Hx   . Early death Neg Hx   . Hearing loss Neg Hx   . Heart disease Neg Hx   . Hyperlipidemia Neg Hx   . Hypertension Neg Hx   . Kidney disease Neg Hx   . Learning disabilities Neg Hx   . Mental illness Neg Hx   . Mental retardation Neg Hx   . Miscarriages / Stillbirths Neg Hx   . Stroke Neg Hx   . Vision loss Neg Hx   . Varicose Veins Neg Hx     History  Substance Use Topics  . Smoking status: Never Smoker   . Smokeless tobacco: Never Used  . Alcohol Use: No    Allergies: No Known Allergies  Prescriptions prior to admission  Medication Sig Dispense Refill Last Dose  . cholecalciferol (VITAMIN D) 1000 UNITS tablet Take 1,000 Units by mouth daily.   Past Week at Unknown time  . ciprofloxacin (CIPRO) 500 MG tablet Take 500 mg by mouth 2 (two) times daily.   Past Week at Unknown  time  . ibuprofen (ADVIL,MOTRIN) 800 MG tablet Take 800 mg by mouth every 8 (eight) hours as needed for headache or moderate pain.   Past Week at Unknown time    Review of Systems  Constitutional: Positive for chills. Negative for fever.  Gastrointestinal: Positive for abdominal pain. Negative for nausea and vomiting.  Genitourinary: Positive for vaginal bleeding.   Physical Exam   Blood pressure 113/59, pulse 96, temperature 100.1 F (37.8 C), temperature source Oral, resp. rate 20, last menstrual period 05/07/2014, SpO2 100 %.  Physical Exam  Vitals reviewed. Constitutional: She is oriented to person, place, and time. She appears well-developed and well-nourished. No distress.  HENT:  Head: Normocephalic.  Neck: Normal range of motion. Neck supple.  Cardiovascular: Normal rate.   Respiratory: Effort normal and breath sounds normal. No respiratory distress. She has no  wheezes. She has no rales. She exhibits no tenderness.  GI: Soft. There is tenderness. There is no rebound.  Enlarged uterus to umbilicus- tender to touch  Musculoskeletal: Normal range of motion.  Neurological: She is alert and oriented to person, place, and time.  Skin: Skin is warm and dry.  Psychiatric: She has a normal mood and affect.    MAU Course  Procedures Results for orders placed or performed during the hospital encounter of 05/29/14 (from the past 24 hour(s))  Urinalysis, Routine w reflex microscopic     Status: Abnormal   Collection Time: 05/29/14  6:40 PM  Result Value Ref Range   Color, Urine YELLOW YELLOW   APPearance CLEAR CLEAR   Specific Gravity, Urine 1.015 1.005 - 1.030   pH 6.0 5.0 - 8.0   Glucose, UA NEGATIVE NEGATIVE mg/dL   Hgb urine dipstick LARGE (A) NEGATIVE   Bilirubin Urine NEGATIVE NEGATIVE   Ketones, ur NEGATIVE NEGATIVE mg/dL   Protein, ur NEGATIVE NEGATIVE mg/dL   Urobilinogen, UA 1.0 0.0 - 1.0 mg/dL   Nitrite NEGATIVE NEGATIVE   Leukocytes, UA NEGATIVE NEGATIVE  Urine microscopic-add on     Status: None   Collection Time: 05/29/14  6:40 PM  Result Value Ref Range   Squamous Epithelial / LPF RARE RARE   WBC, UA 0-2 <3 WBC/hpf   RBC / HPF 7-10 <3 RBC/hpf   Bacteria, UA RARE RARE  CBC     Status: Abnormal   Collection Time: 05/29/14  8:00 PM  Result Value Ref Range   WBC 13.7 (H) 4.0 - 10.5 K/uL   RBC 3.69 (L) 3.87 - 5.11 MIL/uL   Hemoglobin 11.3 (L) 12.0 - 15.0 g/dL   HCT 33.1 (L) 36.0 - 46.0 %   MCV 89.7 78.0 - 100.0 fL   MCH 30.6 26.0 - 34.0 pg   MCHC 34.1 30.0 - 36.0 g/dL   RDW 12.7 11.5 - 15.5 %   Platelets 222 150 - 400 K/uL  hCG, quantitative, pregnancy     Status: Abnormal   Collection Time: 05/29/14  8:00 PM  Result Value Ref Range   hCG, Beta Chain, Quant, S 11220 (H) <5 mIU/mL  ABO/Rh     Status: None (Preliminary result)   Collection Time: 05/29/14  8:00 PM  Result Value Ref Range   ABO/RH(D) B POS   Results for  DALEYZA, GADOMSKI (MRN [id number]) as of 05/29/2014 23:03  Ref. Range 05/28/2014 08:00 05/29/2014 18:40 05/29/2014 20:00  HCG, Beta Chain, Quant, S Latest Ref Range: <5 mIU/mL 17051 (H)  11220 (H)   US Ob Comp Less 14 Wks  05/29/2014  CLINICAL DATA:  Pregnant patient with lower abdominal pain for 2 months, increased over the past few days. Decreasing beta HCG.  EXAM: OBSTETRIC <14 WK [place] AND TRANSVAGINAL OB US  TECHNIQUE: Both transabdominal and transvaginal ultrasound examinations were performed for complete evaluation of the gestation as well as the maternal uterus, adnexal regions, and pelvic cul-de-sac. Transvaginal technique was performed to assess early pregnancy.  COMPARISON:  None.  FINDINGS: Intrauterine gestational sac: Visualize, however are regular in shape and located in the lower uterine segment. There are low-level internal echoes.  Yolk sac:  Present, appears prominent measuring 5.5 mm  Embryo:  Not present.  Cardiac Activity: Not present.  MSD: 20.8  cm   7 w   1  d  Maternal uterus/adnexae: Small subchorionic hemorrhage. The uterus is enlarged and contains multiple large fibroids. Left fundal fibroid measures 13.4 x 10.4 x 11.9 cm, right fibroid measures 6.4 x 5.7 x 5.0 cm. Both ovaries are visualized and are normal, the left ovary is only seen transabdominally. Blood flow seen to both ovaries. Trace free fluid in the pelvis.  IMPRESSION: 1. Irregular appearing intrauterine gestational sac in the lower uterine segment with internal debris. There is a yolk sac but appears prominent but no fetal pole or heart tones. Small subchorionic hemorrhage. Findings are suspicious but not yet definitive for failed pregnancy. Recommend follow-up US in 10-14 days for definitive diagnosis. This recommendation follows SRU consensus guidelines: Diagnostic Criteria for Nonviable Pregnancy Early in the First Trimester. [employer] Med 2013; 562:1308-65. 2. Uterine fibroids.   Electronically Signed   By: Jeb Levering  M.D.   On: 05/29/2014 22:56   US Ob Transvaginal  05/29/2014   CLINICAL DATA:  Pregnant patient with lower abdominal pain for 2 months, increased over the past few days. Decreasing beta HCG.  EXAM: OBSTETRIC <14 WK [place] AND TRANSVAGINAL OB US  TECHNIQUE: Both transabdominal and transvaginal ultrasound examinations were performed for complete evaluation of the gestation as well as the maternal uterus, adnexal regions, and pelvic cul-de-sac. Transvaginal technique was performed to assess early pregnancy.  COMPARISON:  None.  FINDINGS: Intrauterine gestational sac: Visualize, however are regular in shape and located in the lower uterine segment. There are low-level internal echoes.  Yolk sac:  Present, appears prominent measuring 5.5 mm  Embryo:  Not present.  Cardiac Activity: Not present.  MSD: 20.8  cm   7 w   1  d  Maternal uterus/adnexae: Small subchorionic hemorrhage. The uterus is enlarged and contains multiple large fibroids. Left fundal fibroid measures 13.4 x 10.4 x 11.9 cm, right fibroid measures 6.4 x 5.7 x 5.0 cm. Both ovaries are visualized and are normal, the left ovary is only seen transabdominally. Blood flow seen to both ovaries. Trace free fluid in the pelvis.  IMPRESSION: 1. Irregular appearing intrauterine gestational sac in the lower uterine segment with internal debris. There is a yolk sac but appears prominent but no fetal pole or heart tones. Small subchorionic hemorrhage. Findings are suspicious but not yet definitive for failed pregnancy. Recommend follow-up US in 10-14 days for definitive diagnosis. This recommendation follows SRU consensus guidelines: Diagnostic Criteria for Nonviable Pregnancy Early in the First Trimester. [employer] Med 2013; 784:6962-95. 2. Uterine fibroids.   Electronically Signed   By: Jeb Levering M.D.   On: 05/29/2014 22:56     tylenol given for pain Care handed over to Marcille Buffy, CNM  Discussd [place] finding and decreasing HCG with the patient Assessment  and  Plan   1. SAB (spontaneous abortion)   2. Abdominal pain affecting pregnancy   3. Intramural leiomyoma of uterus    Bleeding precautions  Return to MAU as needed FU with Dr. Ruthann Cancer in 1-2 weeks  Follow-up Information    Schedule an appointment as soon as possible for a visit with Frederico Hamman, MD.   Specialty:  Obstetrics and Gynecology   Contact information:   [address] [place] [address] [phone]       Mathis Bud 05/29/2014, 11:04 PM

## 2014-05-29 NOTE — MAU Note (Signed)
C/o SOB and cough for past 3 days, c/o lower abdominal pain for past 2 months- pain has increased this past few days; hx of fibroids; c/o intermittent lower back pain with shooting pain down through pt's buttocks for about month; pt went in for fibroid surgery and found out she was pregnant; ? gestation age;

## 2014-05-29 NOTE — Discharge Instructions (Signed)
Miscarriage A miscarriage is the sudden loss of an unborn baby (fetus) before the 20th week of pregnancy. Most miscarriages happen in the first 3 months of pregnancy. Sometimes, it happens before a woman even knows she is pregnant. A miscarriage is also called a "spontaneous miscarriage" or "early pregnancy loss." Having a miscarriage can be an emotional experience. Talk with your caregiver about any questions you may have about miscarrying, the grieving process, and your future pregnancy plans. CAUSES   Problems with the fetal chromosomes that make it impossible for the baby to develop normally. Problems with the baby's genes or chromosomes are most often the result of errors that occur, by chance, as the embryo divides and grows. The problems are not inherited from the parents.  Infection of the cervix or uterus.   Hormone problems.   Problems with the cervix, such as having an incompetent cervix. This is when the tissue in the cervix is not strong enough to hold the pregnancy.   Problems with the uterus, such as an abnormally shaped uterus, uterine fibroids, or congenital abnormalities.   Certain medical conditions.   Smoking, drinking alcohol, or taking illegal drugs.   Trauma.  Often, the cause of a miscarriage is unknown.  SYMPTOMS   Vaginal bleeding or spotting, with or without cramps or pain.  Pain or cramping in the abdomen or lower back.  Passing fluid, tissue, or blood clots from the vagina. DIAGNOSIS  Your caregiver will perform a physical exam. You may also have an ultrasound to confirm the miscarriage. Blood or urine tests may also be ordered. TREATMENT   Sometimes, treatment is not necessary if you naturally pass all the fetal tissue that was in the uterus. If some of the fetus or placenta remains in the body (incomplete miscarriage), tissue left behind may become infected and must be removed. Usually, a dilation and curettage (D and C) procedure is performed.  During a D and C procedure, the cervix is widened (dilated) and any remaining fetal or placental tissue is gently removed from the uterus.  Antibiotic medicines are prescribed if there is an infection. Other medicines may be given to reduce the size of the uterus (contract) if there is a lot of bleeding.  If you have Rh negative blood and your baby was Rh positive, you will need a Rh immunoglobulin shot. This shot will protect any future baby from having Rh blood problems in future pregnancies. HOME CARE INSTRUCTIONS   Your caregiver may order bed rest or may allow you to continue light activity. Resume activity as directed by your caregiver.  Have someone help with home and family responsibilities during this time.   Keep track of the number of sanitary pads you use each day and how soaked (saturated) they are. Write down this information.   Do not use tampons. Do not douche or have sexual intercourse until approved by your caregiver.   Only take over-the-counter or prescription medicines for pain or discomfort as directed by your caregiver.   Do not take aspirin. Aspirin can cause bleeding.   Keep all follow-up appointments with your caregiver.   If you or your partner have problems with grieving, talk to your caregiver or seek counseling to help cope with the pregnancy loss. Allow enough time to grieve before trying to get pregnant again.  SEEK IMMEDIATE MEDICAL CARE IF:   You have severe cramps or pain in your back or abdomen.  You have a fever.  You pass large blood clots (walnut-sized   or larger) ortissue from your vagina. Save any tissue for your caregiver to inspect.   Your bleeding increases.   You have a thick, bad-smelling vaginal discharge.  You become lightheaded, weak, or you faint.   You have chills.  MAKE SURE YOU:  Understand these instructions.  Will watch your condition.  Will get help right away if you are not doing well or get  worse. Document Released: 06/22/2000 Document Revised: 04/23/2012 Document Reviewed: 02/15/2011 Exit[employer] Patient Information 2015 [employer]. This information is not intended to replace advice given to you by your health care provider. Make sure you discuss any questions you have with your health care provider.  

## 2014-05-30 LAB — ABO/RH: ABO/RH(D): B POS

## 2014-06-01 ENCOUNTER — Encounter (HOSPITAL_COMMUNITY): Payer: Self-pay

## 2014-06-01 ENCOUNTER — Inpatient Hospital Stay (HOSPITAL_COMMUNITY)
Admission: AD | Admit: 2014-06-01 | Discharge: 2014-06-01 | Disposition: A | Discharge status: Home / Self Care | Payer: Medicaid Other | Source: Ambulatory Visit | Attending: Obstetrics | Admitting: Obstetrics

## 2014-06-01 DIAGNOSIS — Z3A01 Less than 8 weeks gestation of pregnancy: Secondary | ICD-10-CM | POA: Insufficient documentation

## 2014-06-01 DIAGNOSIS — O039 Complete or unspecified spontaneous abortion without complication: Secondary | ICD-10-CM | POA: Diagnosis not present

## 2014-06-01 DIAGNOSIS — D259 Leiomyoma of uterus, unspecified: Secondary | ICD-10-CM | POA: Insufficient documentation

## 2014-06-01 DIAGNOSIS — O3411 Maternal care for benign tumor of corpus uteri, first trimester: Secondary | ICD-10-CM | POA: Diagnosis not present

## 2014-06-01 DIAGNOSIS — O034 Incomplete spontaneous abortion without complication: Secondary | ICD-10-CM

## 2014-06-01 DIAGNOSIS — R1030 Lower abdominal pain, unspecified: Secondary | ICD-10-CM | POA: Diagnosis not present

## 2014-06-01 DIAGNOSIS — O9989 Other specified diseases and conditions complicating pregnancy, childbirth and the puerperium: Secondary | ICD-10-CM | POA: Diagnosis not present

## 2014-06-01 DIAGNOSIS — R109 Unspecified abdominal pain: Secondary | ICD-10-CM | POA: Diagnosis present

## 2014-06-01 MED ORDER — HYDROMORPHONE HCL 2 MG PO TABS: 2.0000 mg | ORAL_TABLET | ORAL | Status: DC | PRN

## 2014-06-01 MED ORDER — KETOROLAC TROMETHAMINE 60 MG/2ML IM SOLN
60.0000 mg | Freq: Once | INTRAMUSCULAR | Status: AC
  Administered 2014-06-01: 60 mg via INTRAMUSCULAR
  Filled 2014-06-01: qty 2

## 2014-06-01 MED ORDER — MISOPROSTOL 200 MCG PO TABS: 800.0000 ug | ORAL_TABLET | Freq: Once | ORAL | Status: DC

## 2014-06-01 NOTE — MAU Note (Signed)
Pt presents complaining of lower abdominal and back pain and nausea. Was seen Thursday in MAU and given pain medication but it is not working. Denies vaginal bleeding.

## 2014-06-01 NOTE — Discharge Instructions (Signed)
Call Dr. Marcheta Grammes office on Monday to schedule an appointment.  Get Cytotec from the pharmacy and place the 4 tablets deep into the vagina.  Take the Ibuprofen 800 mg tablets every 8 hours around the clock for the next 2 days.  Use the Dilaudid for severe pain and cramping.

## 2014-06-01 NOTE — MAU Provider Note (Signed)
History     CSN: [id number]  Arrival date and time: 06/01/14 1203   First Provider Initiated Contact with Patient 06/01/14 1222      Chief Complaint  Patient presents with  . Abdominal Pain   HPI Maria Logan 49 y.o. [redacted]w[redacted]d Comes to MAU again today with cramping and lower abdominal pain.  Is not having any vaginal bleeding.  Was seen in MAU  On 05-28-14  - chart reviewed.  Quants have been falling and ultrasound showed IUGS in lower uterine segment.  Client is expecting a miscarriage.  She was given percocet to help with pain.  Took only one dose and had severe side effects of feeling jittery and unable to sleep all night.  Did not take any more.  Has been take Motrin 800 mg and her last dose was last night.  Is having painful cramping now.  Has not yet scheduled an appointment with Dr. Ruthann Cancer as the office was closed.  OB History    Gravida Para Term Preterm AB TAB SAB Ectopic Multiple Living   4 3 3       3       Past Medical History  Diagnosis Date  . Fibroids     Past Surgical History  Procedure Laterality Date  . No past surgeries      Family History  Problem Relation Age of Onset  . Alcohol abuse Neg Hx   . Arthritis Neg Hx   . Asthma Neg Hx   . Birth defects Neg Hx   . Cancer Neg Hx   . COPD Neg Hx   . Depression Neg Hx   . Diabetes Neg Hx   . Drug abuse Neg Hx   . Early death Neg Hx   . Hearing loss Neg Hx   . Heart disease Neg Hx   . Hyperlipidemia Neg Hx   . Hypertension Neg Hx   . Kidney disease Neg Hx   . Learning disabilities Neg Hx   . Mental illness Neg Hx   . Mental retardation Neg Hx   . Miscarriages / Stillbirths Neg Hx   . Stroke Neg Hx   . Vision loss Neg Hx   . Varicose Veins Neg Hx     History  Substance Use Topics  . Smoking status: Never Smoker   . Smokeless tobacco: Never Used  . Alcohol Use: No    Allergies: No Known Allergies  Prescriptions prior to admission  Medication Sig Dispense Refill Last Dose  . cholecalciferol  (VITAMIN D) 1000 UNITS tablet Take 1,000 Units by mouth daily.   Past Week at Unknown time  . ciprofloxacin (CIPRO) 500 MG tablet Take 500 mg by mouth 2 (two) times daily.   Past Week at Unknown time  . HYDROcodone-acetaminophen (NORCO/VICODIN) 5-325 MG per tablet Take 1-2 tablets by mouth every 4 (four) hours as needed. 20 tablet 0   . ibuprofen (ADVIL,MOTRIN) 800 MG tablet Take 800 mg by mouth every 8 (eight) hours as needed for headache or moderate pain.   Past Week at Unknown time  . ibuprofen (ADVIL,MOTRIN) 800 MG tablet Take 1 tablet (800 mg total) by mouth every 8 (eight) hours as needed. 30 tablet 0     Review of Systems  Constitutional: Negative for fever.  Gastrointestinal: Positive for nausea and abdominal pain. Negative for vomiting, diarrhea and constipation.  Genitourinary:       No vaginal discharge. No vaginal bleeding. No dysuria.   Physical Exam   Blood pressure  114/58, pulse 92, temperature 98 F (36.7 C), temperature source Oral, resp. rate 18, last menstrual period 05/07/2014.  Physical Exam  Nursing note and vitals reviewed. Constitutional: She is oriented to person, place, and time. She appears well-developed and well-nourished.  HENT:  Head: Normocephalic.  Eyes: EOM are normal.  Neck: Neck supple.  Musculoskeletal: Normal range of motion.  Neurological: She is alert and oriented to person, place, and time.  Skin: Skin is warm and dry.  Psychiatric: She has a normal mood and affect.    MAU Course  Procedures Reviewed the previous note and previous ultrasound. Given Toradol IM which greatly reduced her pain.  MDM [place] with Dr. Jodi Mourning re: plan of care - will prescribe Cytotec. Blood type B positive  Assessment and Plan  Impending miscarriage Abdominal pain Known fibroids  Plan Call Dr. Marcheta Grammes office on Monday to schedule an appointment. Get Cytotec from the pharmacy and place the 4 tablets deep into the vagina. Take the Ibuprofen  800 mg tablets every 8 hours around the clock for the next 2 days. Use the Dilaudid for severe pain and cramping.   Maria Logan 06/01/2014, 12:37 PM

## 2014-06-02 ENCOUNTER — Inpatient Hospital Stay (HOSPITAL_COMMUNITY)
Admission: AD | Admit: 2014-06-02 | Discharge: 2014-06-02 | Disposition: A | Discharge status: Home / Self Care | Payer: Medicaid Other | Source: Ambulatory Visit | Attending: Obstetrics | Admitting: Obstetrics

## 2014-06-02 DIAGNOSIS — Z3A01 Less than 8 weeks gestation of pregnancy: Secondary | ICD-10-CM

## 2014-06-02 DIAGNOSIS — K5900 Constipation, unspecified: Secondary | ICD-10-CM

## 2014-06-02 DIAGNOSIS — O9989 Other specified diseases and conditions complicating pregnancy, childbirth and the puerperium: Secondary | ICD-10-CM | POA: Diagnosis not present

## 2014-06-02 DIAGNOSIS — O039 Complete or unspecified spontaneous abortion without complication: Secondary | ICD-10-CM | POA: Diagnosis not present

## 2014-06-02 MED ORDER — HYDROCODONE-IBUPROFEN 7.5-200 MG PO TABS: 1.0000 | ORAL_TABLET | ORAL | Status: DC | PRN

## 2014-06-02 MED ORDER — MISOPROSTOL 200 MCG PO TABS
800.0000 ug | ORAL_TABLET | Freq: Once | ORAL | Status: AC
  Administered 2014-06-02: 800 ug via ORAL
  Filled 2014-06-02: qty 4

## 2014-06-02 MED ORDER — MISOPROSTOL 200 MCG PO TABS: 200.0000 ug | ORAL_TABLET | Freq: Once | ORAL | Status: DC

## 2014-06-02 MED ORDER — PROMETHAZINE HCL 25 MG PO TABS: 25.0000 mg | ORAL_TABLET | Freq: Four times a day (QID) | ORAL | Status: DC | PRN

## 2014-06-02 NOTE — MAU Note (Signed)
Feels like she's constipated. Last bowel movement last Wednesday.  Has not taken anything for the constipation.  Taking percocet for miscarriage. Took cytotec yesterday afternoon. Feels like 2 of the pills came out this morning. Called Dr. Marcheta Grammes office about everything that's going on and was told to "be patient".

## 2014-06-02 NOTE — Discharge Instructions (Signed)

## 2014-06-02 NOTE — MAU Provider Note (Signed)
History   G4P3003 in with 7.1 wk non viable IUP. States severly constipated and when she went to [facility] to void the cytotec fell out. Denies cramping or bleeding at present time. States not going back to Dr. Ruthann Cancer he was not kind to her over the phone today.  CSN: [id number]  Arrival date and time: 06/02/14 2027   None     Chief Complaint  Patient presents with  . Constipation  . Abdominal Pain   HPI  OB History    Gravida Para Term Preterm AB TAB SAB Ectopic Multiple Living   4 3 3       3       Past Medical History  Diagnosis Date  . Fibroids     Past Surgical History  Procedure Laterality Date  . No past surgeries      Family History  Problem Relation Age of Onset  . Alcohol abuse Neg Hx   . Arthritis Neg Hx   . Asthma Neg Hx   . Birth defects Neg Hx   . Cancer Neg Hx   . COPD Neg Hx   . Depression Neg Hx   . Diabetes Neg Hx   . Drug abuse Neg Hx   . Early death Neg Hx   . Hearing loss Neg Hx   . Heart disease Neg Hx   . Hyperlipidemia Neg Hx   . Hypertension Neg Hx   . Kidney disease Neg Hx   . Learning disabilities Neg Hx   . Mental illness Neg Hx   . Mental retardation Neg Hx   . Miscarriages / Stillbirths Neg Hx   . Stroke Neg Hx   . Vision loss Neg Hx   . Varicose Veins Neg Hx     History  Substance Use Topics  . Smoking status: Never Smoker   . Smokeless tobacco: Never Used  . Alcohol Use: No    Allergies: No Known Allergies  Prescriptions prior to admission  Medication Sig Dispense Refill Last Dose  . cholecalciferol (VITAMIN D) 1000 UNITS tablet Take 1,000 Units by mouth daily.   05/31/2014 at Unknown time  . HYDROmorphone (DILAUDID) 2 MG tablet Take 1 tablet (2 mg total) by mouth every 4 (four) hours as needed for severe pain. 6 tablet 0   . ibuprofen (ADVIL,MOTRIN) 800 MG tablet Take 1 tablet (800 mg total) by mouth every 8 (eight) hours as needed. 30 tablet 0 05/31/2014 at Unknown time  . misoprostol (CYTOTEC) 200 MCG tablet Place 4  tablets (800 mcg total) vaginally once. 4 tablet 1     Review of Systems  Constitutional: Negative.   HENT: Negative.   Eyes: Negative.   Respiratory: Negative.   Cardiovascular: Negative.   Gastrointestinal: Positive for constipation.  Genitourinary: Negative.   Musculoskeletal: Negative.   Skin: Negative.   Neurological: Negative.   Endo/Heme/Allergies: Negative.   Psychiatric/Behavioral: Negative.    Physical Exam   Blood pressure 108/61, pulse 80, temperature 98.3 F (36.8 C), temperature source Oral, resp. rate 18, height 5\' 4"  (1.626 m), weight 177 lb 2 oz (80.343 kg), last menstrual period 05/07/2014, SpO2 100 %.  Physical Exam  Constitutional: She is oriented to person, place, and time. She appears well-developed and well-nourished.  HENT:  Head: Normocephalic.  Eyes: Pupils are equal, round, and reactive to light.  Neck: Normal range of motion.  Cardiovascular: Normal rate, regular rhythm, normal heart sounds and intact distal pulses.   Respiratory: Effort normal and breath sounds normal.  GI:  Soft. Bowel sounds are normal.  Genitourinary: Vagina normal.  Musculoskeletal: Normal range of motion.  Neurological: She is alert and oriented to person, place, and time. She has normal reflexes.  Skin: Skin is warm and dry.  Psychiatric: She has a normal mood and affect. Her behavior is normal. Judgment and thought content normal.    MAU Course  Procedures  MDM Constipation. Failed IUP   Assessment and Plan  Constipation, failed IUP. P: enema, will give cytotec po. Send home with pain management  Maria Logan DARLENE 06/02/2014, 9:29 PM

## 2014-06-03 ENCOUNTER — Telehealth: Payer: Self-pay | Admitting: *Deleted

## 2014-06-03 NOTE — Telephone Encounter (Addendum)
Pt left message stating that she was seen @ [facility] on 5/22 and 5/23. She has fibroids but also is having a miscarriage. She has taken teh 4 pills as directed and has not had any bleeding or cramping. hse has concerns and wants to know what to do. I returned pt's call and discussed her concerns. She stated that she placed the original 4 pills in her vagina after her [facility] visit on 5/22. She did not have any results and returned there yesterday because she was constipated. She was given an enema and also 4 more pills to take by mouth which she took @ 9pm last night. She still has not had any bleeding or cramping and is getting upset because she is worried and stressed about this problem. She wants to know if this is normal and what is the next step. I provided emotional support and assured pt that this is normal but her condition may require other forms of treatment. I will contact the doctor on call and call her back later today with his recommendations. Pt voiced understanding.   [place] pt after consult with Dr. Nehemiah Settle. I advised her that it is recommended that she have surgical management one day this week for this failed pregnancy. She will be contacted with further instructions once the surgery is scheduled.  Pt voiced understanding and agrees to plan of care.  [place] Presnell @ [place] office notified.

## 2014-06-04 ENCOUNTER — Ambulatory Visit (HOSPITAL_COMMUNITY): Payer: Medicaid Other | Admitting: Anesthesiology

## 2014-06-04 ENCOUNTER — Ambulatory Visit (HOSPITAL_COMMUNITY): Payer: Medicaid Other

## 2014-06-04 ENCOUNTER — Encounter (HOSPITAL_COMMUNITY): Payer: Self-pay | Admitting: Anesthesiology

## 2014-06-04 ENCOUNTER — Encounter (HOSPITAL_COMMUNITY)
Admission: RE | Disposition: A | Discharge status: Home / Self Care | Payer: Self-pay | Source: Ambulatory Visit | Attending: Obstetrics & Gynecology

## 2014-06-04 ENCOUNTER — Other Ambulatory Visit: Payer: Self-pay | Admitting: Obstetrics & Gynecology

## 2014-06-04 ENCOUNTER — Ambulatory Visit (HOSPITAL_COMMUNITY)
Admission: RE | Admit: 2014-06-04 | Discharge: 2014-06-04 | Disposition: A | Discharge status: Home / Self Care | Payer: Medicaid Other | Source: Ambulatory Visit | Attending: Obstetrics & Gynecology | Admitting: Obstetrics & Gynecology

## 2014-06-04 DIAGNOSIS — Z3A01 Less than 8 weeks gestation of pregnancy: Secondary | ICD-10-CM | POA: Insufficient documentation

## 2014-06-04 DIAGNOSIS — O021 Missed abortion: Secondary | ICD-10-CM | POA: Insufficient documentation

## 2014-06-04 DIAGNOSIS — D259 Leiomyoma of uterus, unspecified: Secondary | ICD-10-CM | POA: Insufficient documentation

## 2014-06-04 DIAGNOSIS — Z6830 Body mass index (BMI) 30.0-30.9, adult: Secondary | ICD-10-CM | POA: Diagnosis not present

## 2014-06-04 DIAGNOSIS — E669 Obesity, unspecified: Secondary | ICD-10-CM | POA: Insufficient documentation

## 2014-06-04 HISTORY — PX: EXAMINATION UNDER ANESTHESIA: SHX1540

## 2014-06-04 HISTORY — PX: OPERATIVE ULTRASOUND: SHX5996

## 2014-06-04 LAB — HCG, QUANTITATIVE, PREGNANCY: HCG, BETA CHAIN, QUANT, S: 4810 m[IU]/mL — AB (ref <5)

## 2014-06-04 SURGERY — EXAM UNDER ANESTHESIA
Anesthesia: Monitor Anesthesia Care | Site: Vagina

## 2014-06-04 MED ORDER — FENTANYL CITRATE (PF) 100 MCG/2ML IJ SOLN
INTRAMUSCULAR | Status: DC | PRN
  Administered 2014-06-04 (×2): 50 ug via INTRAVENOUS
  Administered 2014-06-04 (×2): 25 ug via INTRAVENOUS

## 2014-06-04 MED ORDER — DOXYCYCLINE HYCLATE 100 MG IV SOLR
200.0000 mg | INTRAVENOUS | Status: DC
  Filled 2014-06-04: qty 200

## 2014-06-04 MED ORDER — HYDROCODONE-ACETAMINOPHEN 5-325 MG PO TABS: ORAL_TABLET | ORAL | Status: DC

## 2014-06-04 MED ORDER — FENTANYL CITRATE (PF) 100 MCG/2ML IJ SOLN: 25.0000 ug | INTRAMUSCULAR | Status: DC | PRN

## 2014-06-04 MED ORDER — DOXYCYCLINE HYCLATE 100 MG IV SOLR
200.0000 mg | Freq: Once | INTRAVENOUS | Status: AC
  Administered 2014-06-04: 200 mg via INTRAVENOUS
  Filled 2014-06-04: qty 200

## 2014-06-04 MED ORDER — DEXAMETHASONE SODIUM PHOSPHATE 10 MG/ML IJ SOLN
INTRAMUSCULAR | Status: DC | PRN
  Administered 2014-06-04: 4 mg via INTRAVENOUS

## 2014-06-04 MED ORDER — FENTANYL CITRATE (PF) 100 MCG/2ML IJ SOLN
INTRAMUSCULAR | Status: AC
  Filled 2014-06-04: qty 2

## 2014-06-04 MED ORDER — METOCLOPRAMIDE HCL 5 MG/ML IJ SOLN: 10.0000 mg | Freq: Once | INTRAMUSCULAR | Status: DC | PRN

## 2014-06-04 MED ORDER — MIDAZOLAM HCL 2 MG/2ML IJ SOLN
INTRAMUSCULAR | Status: AC
  Filled 2014-06-04: qty 2

## 2014-06-04 MED ORDER — MEPERIDINE HCL 25 MG/ML IJ SOLN: 6.2500 mg | INTRAMUSCULAR | Status: DC | PRN

## 2014-06-04 MED ORDER — MIDAZOLAM HCL 2 MG/2ML IJ SOLN
INTRAMUSCULAR | Status: DC | PRN
  Administered 2014-06-04: 2 mg via INTRAVENOUS

## 2014-06-04 MED ORDER — BUPIVACAINE-EPINEPHRINE (PF) 0.5% -1:200000 IJ SOLN
INTRAMUSCULAR | Status: DC | PRN
  Administered 2014-06-04: 20 mL via PERINEURAL

## 2014-06-04 MED ORDER — KETOROLAC TROMETHAMINE 30 MG/ML IJ SOLN
INTRAMUSCULAR | Status: DC | PRN
  Administered 2014-06-04: 30 mg via INTRAVENOUS

## 2014-06-04 MED ORDER — PROPOFOL 500 MG/50ML IV EMUL
INTRAVENOUS | Status: DC | PRN
Start: 2014-06-04 — End: 2014-06-04
  Administered 2014-06-04 (×8): 50 mg via INTRAVENOUS

## 2014-06-04 MED ORDER — MEDROXYPROGESTERONE ACETATE 150 MG/ML IM SUSP: 150.0000 mg | Freq: Once | INTRAMUSCULAR | Status: DC

## 2014-06-04 MED ORDER — ONDANSETRON HCL 4 MG/2ML IJ SOLN
INTRAMUSCULAR | Status: DC | PRN
  Administered 2014-06-04: 4 mg via INTRAVENOUS

## 2014-06-04 MED ORDER — BUPIVACAINE-EPINEPHRINE (PF) 0.5% -1:200000 IJ SOLN
INTRAMUSCULAR | Status: AC
  Filled 2014-06-04: qty 30

## 2014-06-04 MED ORDER — ONDANSETRON HCL 4 MG/2ML IJ SOLN
INTRAMUSCULAR | Status: AC
  Filled 2014-06-04: qty 2

## 2014-06-04 MED ORDER — LACTATED RINGERS IV SOLN
INTRAVENOUS | Status: DC
  Administered 2014-06-04 (×2): via INTRAVENOUS

## 2014-06-04 MED ORDER — DOXYCYCLINE HYCLATE 100 MG IV SOLR: 200.0000 mg | INTRAVENOUS | Status: DC

## 2014-06-04 MED ORDER — DEXAMETHASONE SODIUM PHOSPHATE 10 MG/ML IJ SOLN
INTRAMUSCULAR | Status: AC
  Filled 2014-06-04: qty 1

## 2014-06-04 MED ORDER — LACTATED RINGERS IV SOLN
INTRAVENOUS | Status: DC
  Administered 2014-06-04 (×2): via INTRAVENOUS

## 2014-06-04 MED ORDER — KETOROLAC TROMETHAMINE 30 MG/ML IJ SOLN
INTRAMUSCULAR | Status: AC
  Filled 2014-06-04: qty 1

## 2014-06-04 SURGICAL SUPPLY — 15 items
CATH ROBINSON RED A/P 16FR (CATHETERS) ×2 IMPLANT
CLOTH BEACON ORANGE TIMEOUT ST (SAFETY) ×2 IMPLANT
DECANTER SPIKE VIAL GLASS SM (MISCELLANEOUS) ×2 IMPLANT
GLOVE BIO SURGEON STRL SZ7 (GLOVE) ×2 IMPLANT
GLOVE BIOGEL PI IND STRL 7.0 (GLOVE) ×1 IMPLANT
GLOVE BIOGEL PI INDICATOR 7.0 (GLOVE) ×1
GOWN STRL REUS W/TWL LRG LVL3 (GOWN DISPOSABLE) ×4 IMPLANT
KIT BERKELEY 1ST TRIMESTER 3/8 (MISCELLANEOUS) ×2 IMPLANT
NS IRRIG 1000ML POUR BTL (IV SOLUTION) ×2 IMPLANT
PACK VAGINAL MINOR WOMEN LF (CUSTOM PROCEDURE TRAY) ×2 IMPLANT
PAD OB MATERNITY 4.3X12.25 (PERSONAL CARE ITEMS) ×2 IMPLANT
PAD PREP 24X48 CUFFED NSTRL (MISCELLANEOUS) ×2 IMPLANT
SET BERKELEY SUCTION TUBING (SUCTIONS) ×2 IMPLANT
TOWEL OR 17X24 6PK STRL BLUE (TOWEL DISPOSABLE) ×4 IMPLANT
VACURETTE 7MM CVD STRL WRAP (CANNULA) ×1 IMPLANT

## 2014-06-04 NOTE — Anesthesia Preprocedure Evaluation (Signed)
Anesthesia Evaluation  Patient identified by MRN, date of birth, ID band Patient awake    Reviewed: Allergy & Precautions, NPO status , Patient's Chart, lab work & pertinent test results  Airway Mallampati: III  TM Distance: >3 FB Neck ROM: Full    Dental no notable dental hx. (+) Teeth Intact   Pulmonary neg pulmonary ROS,  breath sounds clear to auscultation  Pulmonary exam normal       Cardiovascular negative cardio ROS Normal cardiovascular examRhythm:Regular Rate:Normal     Neuro/Psych negative neurological ROS  negative psych ROS   GI/Hepatic negative GI ROS, Neg liver ROS,   Endo/Other  Obesity  Renal/GU negative Renal ROS  negative genitourinary   Musculoskeletal negative musculoskeletal ROS (+)   Abdominal (+) + obese,   Peds  Hematology  (+) anemia ,   Anesthesia Other Findings   Reproductive/Obstetrics (+) Pregnancy Missed Ab                             Anesthesia Physical Anesthesia Plan  ASA: II  Anesthesia Plan: MAC   Post-op Pain Management:    Induction: Intravenous  Airway Management Planned: Mask and Natural Airway  Additional Equipment:   Intra-op Plan:   Post-operative Plan:   Informed Consent: I have reviewed the patients History and Physical, chart, labs and discussed the procedure including the risks, benefits and alternatives for the proposed anesthesia with the patient or authorized representative who has indicated his/her understanding and acceptance.     Plan Discussed with: Anesthesiologist, CRNA and Surgeon  Anesthesia Plan Comments:         Anesthesia Quick Evaluation

## 2014-06-04 NOTE — H&P (Signed)
Maria Logan is an 49 y.o. female with 7 week missed abortion.  Patient failed cytotec.  Beta dropping no large bleed yet.    Pertinent Gynecological History: Unplanned pregnancy Large fibroids.  Undergoing [place]  Later this month (pt of Dr. Ruthann Cancer until 06/02/14) Previous GYN Procedures: none  Last mammogram: normal Date: 2009 Last pap: normal Date: 2012 OB History: G4, P3003   Menstrual History:  Patient's last menstrual period was 05/07/2014 (approximate).    Past Medical History  Diagnosis Date  . Fibroids     Past Surgical History  Procedure Laterality Date  . No past surgeries      Family History  Problem Relation Age of Onset  . Alcohol abuse Neg Hx   . Arthritis Neg Hx   . Asthma Neg Hx   . Birth defects Neg Hx   . Cancer Neg Hx   . COPD Neg Hx   . Depression Neg Hx   . Diabetes Neg Hx   . Drug abuse Neg Hx   . Early death Neg Hx   . Hearing loss Neg Hx   . Heart disease Neg Hx   . Hyperlipidemia Neg Hx   . Hypertension Neg Hx   . Kidney disease Neg Hx   . Learning disabilities Neg Hx   . Mental illness Neg Hx   . Mental retardation Neg Hx   . Miscarriages / Stillbirths Neg Hx   . Stroke Neg Hx   . Vision loss Neg Hx   . Varicose Veins Neg Hx     Social History:  reports that she has never smoked. She has never used smokeless tobacco. She reports that she does not drink alcohol or use illicit drugs.  Allergies: No Known Allergies  Prescriptions prior to admission  Medication Sig Dispense Refill Last Dose  . [employer] D3 1000 UNITS capsule Take 1,000 Units by mouth daily.  3   . HYDROcodone-ibuprofen (VICOPROFEN) 7.5-200 MG per tablet Take 1 tablet by mouth every 4 (four) hours as needed for moderate pain. (Patient not taking: Reported on 06/03/2014) 30 tablet 0 Not Taking at Unknown time  . ibuprofen (ADVIL,MOTRIN) 800 MG tablet Take 800 mg by mouth 3 (three) times daily.  2   . misoprostol (CYTOTEC) 200 MCG tablet Place 4 tablets (800 mcg total)  vaginally once. 4 tablet 1 06/01/2014 at Unknown time  . promethazine (PHENERGAN) 25 MG tablet Take 1 tablet (25 mg total) by mouth every 6 (six) hours as needed for nausea or vomiting. 30 tablet 0     Review of Systems  Constitutional: Negative.   HENT: Positive for congestion.   Respiratory: Positive for cough.        Pt having symptoms of seasonal allergies.   Cardiovascular: Negative.   Gastrointestinal: Negative.   Musculoskeletal: Negative.   Skin: Negative.   Psychiatric/Behavioral: Negative.     Blood pressure 108/57, pulse 88, temperature 98.4 F (36.9 C), temperature source Oral, resp. rate 20, last menstrual period 05/07/2014, SpO2 100 %. Physical Exam  Vitals reviewed. Constitutional: She is oriented to person, place, and time. She appears well-developed and well-nourished. No distress.  HENT:  Head: Normocephalic and atraumatic.  Eyes: Conjunctivae are normal.  Neck: No tracheal deviation present. No thyromegaly present.  Cardiovascular: Normal rate and regular rhythm.   Respiratory: Effort normal and breath sounds normal. No respiratory distress. She has no wheezes. She has no rales.  GI: Soft.  24 week fibroid uterus.  Genitourinary:  Pelvic to be repeated  in the OR under anesthesia.  Musculoskeletal: She exhibits no edema.  Neurological: She is alert and oriented to person, place, and time.  Skin: Skin is warm and dry.  Psychiatric: She has a normal mood and affect.    No results found for this or any previous visit (from the past 24 hour(s)).  No results found.  Assessment/Plan: 49 yo G4P3003 with 7 weeks sac missed abortion.  Pt tried cytotec x1 and pills fell out.  Pt for D&E. Patient was counseled regarding need for  D&E.  Risks of surgery including bleeding, infection, injury to surrounding organs, need for additional procedures, possibility of intrauterine scarring which may impair future fertility, risk of retained products which may require further  management and other postoperative/anesthesia complications were explained to patient.  Likelihood of success of complete evacuation of the uterus was discussed with the patient.  Written informed consent was obtained. Anesthesia and OR aware.  Preoperative prophylactic Doxycycline 200mg  IV  has been ordered and is on call to the OR.  To OR when ready.    Yazeed Pryer H. 06/04/2014, 3:44 PM

## 2014-06-04 NOTE — Transfer of Care (Signed)
Immediate Anesthesia Transfer of Care Note  Patient: Maria Logan  Procedure(s) Performed: Procedure(s): EXAM UNDER ANESTHESIA (N/A) OPERATIVE ULTRASOUND (N/A)  Patient Location: PACU  Anesthesia Type:MAC  Level of Consciousness: awake, alert  and oriented  Airway & Oxygen Therapy: Patient Spontanous Breathing and Patient connected to nasal cannula oxygen  Post-op Assessment: Report given to RN and Post -op Vital signs reviewed and stable  Post vital signs: Reviewed and stable  Last Vitals:  Filed Vitals:   06/04/14 1710  BP:   Pulse: 70  Temp: 36.9 C  Resp:     Complications: No apparent anesthesia complications

## 2014-06-04 NOTE — Op Note (Addendum)
[facility] PROCEDURE DATE: 06/04/2014  PREOPERATIVE DIAGNOSIS: 49 yo female with 7 week size gestational sac with missed abortion; large fibroids  POSTOPERATIVE DIAGNOSIS: 49 yo female with no intraoperative [place] evidence of gestational sac after cervical dilation.  PROCEDURE:     Exam under anesthesia, cervical dilation, intraoperative [place].  SURGEON:  Dr. Silas Sacramento  INDICATIONS: 49 y.o. G4P3003with MAB at [redacted] weeks gestation, needing surgical completion.  Risks of surgery were discussed with the patient including but not limited to: bleeding which may require transfusion; infection which may require antibiotics; injury to uterus or surrounding organs;need for additional procedures including laparotomy or laparoscopy; possibility of intrauterine scarring which may impair future fertility; and other postoperative/anesthesia complications. Patient has enlarged fibroids and understands this can make accessing the endometrial cavity. Difficult.Written informed consent was obtained.    FINDINGS:  A 24 size anteverted, markedly enlarged uterus, no IUGS on intraoperative [place].  ANESTHESIA:    Monitored intravenous sedation, paracervical block.  ESTIMATED BLOOD LOSS:  Less than 20 ml.  SPECIMENS:  None  COMPLICATIONS:  None immediate.  PROCEDURE DETAILS:  The patient received intravenous antibiotics while in the preoperative area.  She was then taken to the operating room where monitored intravenous sedation was administered and was found to be adequate.  After an adequate timeout was performed, she was placed in the dorsal lithotomy position and examined; then prepped and draped in the sterile manner.   Her bladder was catheterized for an unmeasured amount of clear, yellow urine. An extra long vaginal speculum was then placed in the patient's vagina and a single tooth tenaculum was applied to the anterior lip of the cervix.  A paracervical block using 20 ml of 0.5% Marcaine with epinephrine was  administered. The cervix was gently dilated to accommodate a 60mm.  The dilation did not feel normal as there was no resistance at the internal os.  There was a small amount of bleeding.  After introducing the suction catheter to 6.5 cm there was resistance and a small amount of bleeding (20cc).  An intraoperative [place] was done and the suction catheter was abutting the 18 cm fibroid.  The endometrial canal could be seen but not accessed due to the 90 degree angle of anteflexion.  Dr. Conchita Paris, radiologist, came to the operating room and agreed that no IUGS was seen.   There was no [place] evidence of retained POC.  There was minimal bleeding noted and the tenaculum removed with good hemostasis noted.   All instruments were removed from the patient's vagina. The patient tolerated the procedure well and was taken to the recovery area awake, and in stable condition.  The patient will be discharged to home as per PACU criteria.  All [place] images and plan of care was communicated to patient in detail.  PACU RN Remer Macho present for extensive communication.  Patient will hve HCG done in PACU and f/u in one week in the clinic for repeat beta and to discuss [place] vs hysterectomy with physician.  Routine postoperative instructions given.  She was prescribed Percocet and Ibuprofen.

## 2014-06-04 NOTE — Discharge Instructions (Signed)
Abdominal Hysterectomy Abdominal hysterectomy is a surgical procedure to remove your womb (uterus). Your uterus is the muscular organ that contains a developing baby. This surgery is done for many reasons. You may need an abdominal hysterectomy if you have cancer, growths (tumors), long-term pain, or bleeding. You may also have this procedure if your uterus has slipped down into your vagina (uterine prolapse). Depending on why you need an abdominal hysterectomy, you may also have other reproductive organs removed. These could include the part of your vagina that connects with your uterus (cervix), the organs that make eggs (ovaries), and the tubes that connect the ovaries to the uterus (fallopian tubes). LET [facility] CARE PROVIDER KNOW ABOUT:   Any allergies you have.  All medicines you are taking, including vitamins, herbs, eye drops, creams, and over-the-counter medicines.  Previous problems you or members of your family have had with the use of anesthetics.  Any blood disorders you have.  Previous surgeries you have had.  Medical conditions you have. RISKS AND COMPLICATIONS Generally, this is a safe procedure. However, as with any procedure, problems can occur. Infection is the most common problem after an abdominal hysterectomy. Other possible problems include:  Bleeding.  Formation of blood clots that may break free and travel to your lungs.  Injury to other organs near your uterus.  Nerve injury causing nerve pain.  Decreased interest in sex or pain during sexual intercourse. BEFORE THE PROCEDURE  Abdominal hysterectomy is a major surgical procedure. It can affect the way you feel about yourself. Talk to your health care provider about the physical and emotional changes hysterectomy may cause.  You may need to have blood work and X-rays done before surgery.  Quit smoking if you smoke. Ask your health care provider for help if you are struggling to quit.  Stop taking  medicines that thin your blood as directed by your health care provider.  You may be instructed to take antibiotic medicines or laxatives before surgery.  Do not eat or drink anything for 6-8 hours before surgery.  Take your regular medicines with a small sip of water.  Bathe or shower the night or morning before surgery. PROCEDURE  Abdominal hysterectomy is done in the operating room at the hospital.  In most cases, you will be given a medicine that makes you go to sleep (general anesthetic).  The surgeon will make a cut (incision) through the skin in your lower belly.  The incision may be about 5-7 inches long. It may go side-to-side or up-and-down.  The surgeon will move aside the body tissue that covers your uterus. The surgeon will then carefully take out your uterus along with any of your other reproductive organs that need to be removed.  Bleeding will be controlled with clamps or sutures.  The surgeon will close your incision with sutures or metal clips.  Post Anesthesia Home Care Instructions  Activity: Get plenty of rest for the remainder of the day. A responsible adult should stay with you for 24 hours following the procedure.  For the next 24 hours, DO NOT: -Drive a car -Paediatric nurse -Drink alcoholic beverages -Take any medication unless instructed by your physician -Make any legal decisions or sign important papers.  Meals: Start with liquid foods such as gelatin or soup. Progress to regular foods as tolerated. Avoid greasy, spicy, heavy foods. If nausea and/or vomiting occur, drink only clear liquids until the nausea and/or vomiting subsides. Call your physician if vomiting continues.  Special Instructions/Symptoms: Your  throat may feel dry or sore from the anesthesia or the breathing tube placed in your throat during surgery. If this causes discomfort, gargle with warm salt water. The discomfort should disappear within 24 hours.  If you had a scopolamine  patch placed behind your ear for the management of post- operative nausea and/or vomiting:  1. The medication in the patch is effective for 72 hours, after which it should be removed.  Wrap patch in a tissue and discard in the trash. Wash hands thoroughly with soap and water. 2. You may remove the patch earlier than 72 hours if you experience unpleasant side effects which may include dry mouth, dizziness or visual disturbances. 3. Avoid touching the patch. Wash your hands with soap and water after contact with the patch.

## 2014-06-04 NOTE — Anesthesia Postprocedure Evaluation (Signed)
  Anesthesia Post-op Note  Patient: Maria Logan  Procedure(s) Performed: Procedure(s): EXAM UNDER ANESTHESIA (N/A) OPERATIVE ULTRASOUND (N/A)  Patient Location: PACU  Anesthesia Type:MAC  Level of Consciousness: awake  Airway and Oxygen Therapy: Patient Spontanous Breathing  Post-op Pain: mild  Post-op Assessment: Post-op Vital signs reviewed and Patient's Cardiovascular Status Stable  Post-op Vital Signs: Reviewed and stable  Last Vitals:  Filed Vitals:   06/04/14 1715  BP: 100/56  Pulse: 74  Temp:   Resp: 14    Complications: No apparent anesthesia complications

## 2014-06-05 ENCOUNTER — Encounter (HOSPITAL_COMMUNITY): Payer: Self-pay | Admitting: Obstetrics & Gynecology

## 2014-06-05 ENCOUNTER — Telehealth: Payer: Self-pay | Admitting: *Deleted

## 2014-06-05 NOTE — Telephone Encounter (Signed)
Pt contacted clinic for bhcg results.  Results given, pt had questions concerning next step in the process.  Dr. Deniece Ree reviewed results and recommends a lab to check bhcg level in 2 weeks.  Scheduled for June 19 2014 @ 0800 for bhcg.  Pt verbalizes understanding.

## 2014-06-12 ENCOUNTER — Encounter: Payer: Self-pay | Admitting: Obstetrics and Gynecology

## 2014-06-12 ENCOUNTER — Ambulatory Visit (INDEPENDENT_AMBULATORY_CARE_PROVIDER_SITE_OTHER): Payer: Self-pay | Admitting: Obstetrics and Gynecology

## 2014-06-12 VITALS — BP 120/64 | HR 90 | Ht 66.0 in | Wt 172.3 lb

## 2014-06-12 DIAGNOSIS — O039 Complete or unspecified spontaneous abortion without complication: Secondary | ICD-10-CM

## 2014-06-12 DIAGNOSIS — Z9889 Other specified postprocedural states: Secondary | ICD-10-CM

## 2014-06-12 NOTE — Progress Notes (Signed)
Patient ID: Maria Logan, female   DOB: September 23, 1965, 49 y.o.   MRN: [id number] 49 yo presenting today for follow up from SAB. Patient was diagnosed with missed Ab on 05/29/2014 and failed medical management with cytotec. She reports that she never experienced an episode of heavy vaginal bleeding. She was scheduled for D&C on 06/04/2014 but intraoperative ultrasound demonstrated no intrauterine pregnancy and the previously seen gestational sac was no longer visible. Patient reports today that she is experiencing occasional dark discharge but nothing comparable to a period. She denies any cramping pain.  Past Medical History  Diagnosis Date  . Fibroids    Past Surgical History  Procedure Laterality Date  . No past surgeries    . Examination under anesthesia N/A 06/04/2014    Procedure: EXAM UNDER ANESTHESIA;  Surgeon: Guss Bunde, MD;  Location: [facility];  Service: Gynecology;  Laterality: N/A;  . Operative ultrasound N/A 06/04/2014    Procedure: OPERATIVE ULTRASOUND;  Surgeon: Guss Bunde, MD;  Location: [facility];  Service: Gynecology;  Laterality: N/A;   Family History  Problem Relation Age of Onset  . Alcohol abuse Neg Hx   . Arthritis Neg Hx   . Asthma Neg Hx   . Birth defects Neg Hx   . Cancer Neg Hx   . COPD Neg Hx   . Depression Neg Hx   . Diabetes Neg Hx   . Drug abuse Neg Hx   . Early death Neg Hx   . Hearing loss Neg Hx   . Heart disease Neg Hx   . Hyperlipidemia Neg Hx   . Hypertension Neg Hx   . Kidney disease Neg Hx   . Learning disabilities Neg Hx   . Mental illness Neg Hx   . Mental retardation Neg Hx   . Miscarriages / Stillbirths Neg Hx   . Stroke Neg Hx   . Vision loss Neg Hx   . Varicose Veins Neg Hx    History  Substance Use Topics  . Smoking status: Never Smoker   . Smokeless tobacco: Never Used  . Alcohol Use: No   ROS See pertinent in HPI  GENERAL: Well-developed, well-nourished female in no acute distress.  ABDOMEN: Soft, nontender, nondistended.   PELVIC: Not performed EXTREMITIES: No cyanosis, clubbing, or edema, 2+ distal pulses.   A/P 49 yo diagnosed with spontaneous Ab - Quant HCG has been trending down consistent with spontaneous abortion - Will obtain quant HCG today and weekly until less than 5 - patient is interested in [place] for contraception but will return when medicaid is active - patient is scheduled for [place] on 06/19/2014 for fibroid uterus and should be able to move forward with the procedure as long as quant continues to trend down - patient will be contacted with results and follow up management plan

## 2014-06-13 ENCOUNTER — Telehealth: Payer: Self-pay

## 2014-06-13 LAB — HCG, QUANTITATIVE, PREGNANCY: hCG, Beta Chain, Quant, S: 2515.9 m[IU]/mL

## 2014-06-13 NOTE — Telephone Encounter (Addendum)
Per Dr. Elly Modena to inform patient that HCG level is decreasing and needs to return in one week for repeat HCG. Called patient. No answer. Left message stating we are calling with results, please call clinic.   6/7  0944  Pt left message stating that a detailed message can be left on her voice mail regarding her test results. I called pt back, she answered and we discussed her results. I stated that the hormone level is decreasing and she needs repeat level drawn in 1 week which would be on 6/9.  Pt stated that she is scheduled to have a procedure on her fibroids ([place]) that day but they will not be able to do it if her hormone level is still too high. I advised that pt come in tomorrow morning for lab draw. She reported that she has started a new job and it may be difficult for her to come in but she will try. She agreed to lab draw @ 0730 tomorrow.   Diane Day RNC

## 2014-06-18 ENCOUNTER — Other Ambulatory Visit: Payer: Self-pay

## 2014-06-18 ENCOUNTER — Inpatient Hospital Stay (HOSPITAL_COMMUNITY)
Admission: AD | Admit: 2014-06-18 | Discharge: 2014-06-19 | Disposition: A | Discharge status: Home / Self Care | Payer: Medicaid Other | Source: Ambulatory Visit | Attending: Obstetrics & Gynecology | Admitting: Obstetrics & Gynecology

## 2014-06-18 ENCOUNTER — Encounter (HOSPITAL_COMMUNITY): Payer: Self-pay | Admitting: *Deleted

## 2014-06-18 ENCOUNTER — Other Ambulatory Visit: Payer: Self-pay | Admitting: Physician Assistant

## 2014-06-18 DIAGNOSIS — Z3A00 Weeks of gestation of pregnancy not specified: Secondary | ICD-10-CM | POA: Insufficient documentation

## 2014-06-18 DIAGNOSIS — O039 Complete or unspecified spontaneous abortion without complication: Secondary | ICD-10-CM

## 2014-06-18 DIAGNOSIS — R1030 Lower abdominal pain, unspecified: Secondary | ICD-10-CM | POA: Diagnosis present

## 2014-06-18 LAB — URINALYSIS, ROUTINE W REFLEX MICROSCOPIC
BILIRUBIN URINE: NEGATIVE
Glucose, UA: NEGATIVE mg/dL
Ketones, ur: NEGATIVE mg/dL
NITRITE: NEGATIVE
PH: 6 (ref 5.0–8.0)
Protein, ur: NEGATIVE mg/dL
Specific Gravity, Urine: 1.02 (ref 1.005–1.030)
Urobilinogen, UA: 0.2 mg/dL (ref 0.0–1.0)

## 2014-06-18 LAB — CBC
HCT: 31.4 % — ABNORMAL LOW (ref 36.0–46.0)
Hemoglobin: 10.4 g/dL — ABNORMAL LOW (ref 12.0–15.0)
MCH: 29.9 pg (ref 26.0–34.0)
MCHC: 33.1 g/dL (ref 30.0–36.0)
MCV: 90.2 fL (ref 78.0–100.0)
Platelets: 301 10*3/uL (ref 150–400)
RBC: 3.48 MIL/uL — ABNORMAL LOW (ref 3.87–5.11)
RDW: 12.3 % (ref 11.5–15.5)
WBC: 7.5 10*3/uL (ref 4.0–10.5)

## 2014-06-18 LAB — URINE MICROSCOPIC-ADD ON

## 2014-06-18 LAB — HCG, QUANTITATIVE, PREGNANCY: hCG, Beta Chain, Quant, S: 1356 m[IU]/mL — ABNORMAL HIGH (ref <5)

## 2014-06-18 NOTE — MAU Note (Signed)
Pt reports she has a brown vaginal discharge. Was seen in clinic last week. Was told she was in the process of a miscarriage and has had cytotec, and came in for a D&C but was told there was nothing there.

## 2014-06-18 NOTE — MAU Provider Note (Signed)
History     CSN: [id number]  Arrival date and time: 06/18/14 2254   First Provider Initiated Contact with Patient 06/18/14 2332      No chief complaint on file.  HPI Comments: Maria Logan is 49 y.o. [phone] who presents today with lower abdominal pain, and bilateral shoulder pain. She rates her current pain 6/10. She has not taken anything for the pain at this time. She never made an appointment for FU in clinic to follow [facility]. She just got a new job, and can't commit to a time off of work. She also reports some brown bleeding at this time as well.   Shoulder Pain  The pain is present in the left shoulder and right shoulder. This is a new problem. The current episode started in the past 7 days (about 3 days  ago). The problem occurs intermittently. The problem has been unchanged. The quality of the pain is described as aching. The pain is at a severity of 6/10. Pertinent negatives include no fever. She has tried nothing for the symptoms.    Past Medical History  Diagnosis Date  . Fibroids     Past Surgical History  Procedure Laterality Date  . No past surgeries    . Examination under anesthesia N/A 06/04/2014    Procedure: EXAM UNDER ANESTHESIA;  Surgeon: Guss Bunde, MD;  Location: [facility];  Service: Gynecology;  Laterality: N/A;  . Operative ultrasound N/A 06/04/2014    Procedure: OPERATIVE ULTRASOUND;  Surgeon: Guss Bunde, MD;  Location: [facility];  Service: Gynecology;  Laterality: N/A;    Family History  Problem Relation Age of Onset  . Alcohol abuse Neg Hx   . Arthritis Neg Hx   . Asthma Neg Hx   . Birth defects Neg Hx   . Cancer Neg Hx   . COPD Neg Hx   . Depression Neg Hx   . Diabetes Neg Hx   . Drug abuse Neg Hx   . Early death Neg Hx   . Hearing loss Neg Hx   . Heart disease Neg Hx   . Hyperlipidemia Neg Hx   . Hypertension Neg Hx   . Kidney disease Neg Hx   . Learning disabilities Neg Hx   . Mental illness Neg Hx   . Mental retardation Neg Hx   .  Miscarriages / Stillbirths Neg Hx   . Stroke Neg Hx   . Vision loss Neg Hx   . Varicose Veins Neg Hx     History  Substance Use Topics  . Smoking status: Never Smoker   . Smokeless tobacco: Never Used  . Alcohol Use: No    Allergies: No Known Allergies  Prescriptions prior to admission  Medication Sig Dispense Refill Last Dose  . [employer] D3 1000 UNITS capsule Take 1,000 Units by mouth daily.  3 Past Week at Unknown time  . ibuprofen (ADVIL,MOTRIN) 800 MG tablet Take 800 mg by mouth 3 (three) times daily.  2 06/18/2014 at Unknown time  . HYDROcodone-acetaminophen (NORCO) 5-325 MG per tablet Take 1-2 tablets by mouth every 4 hours as needed for pain 20 tablet 0 Not Taking  . promethazine (PHENERGAN) 25 MG tablet Take 1 tablet (25 mg total) by mouth every 6 (six) hours as needed for nausea or vomiting. 30 tablet 0 Not Taking    Review of Systems  Constitutional: Negative for fever.  Gastrointestinal: Positive for nausea and abdominal pain. Negative for vomiting, diarrhea and constipation.  Genitourinary: Negative for  dysuria, urgency and frequency.   Physical Exam   Blood pressure 109/66, pulse 79, temperature 98.2 F (36.8 C), temperature source Oral, resp. rate 18, height 5\' 6"  (1.676 m), weight 79.379 kg (175 lb), last menstrual period 05/07/2014, SpO2 98 %, unknown if currently breastfeeding.  Physical Exam  Nursing note and vitals reviewed. Constitutional: She is oriented to person, place, and time. She appears well-developed and well-nourished. No distress.  Cardiovascular: Normal rate.   Respiratory: Effort normal.  GI: Soft. There is no tenderness. There is no rebound.  Genitourinary:  Fibroid uterus, at the level of the umbilicus. Non-tender   Neurological: She is alert and oriented to person, place, and time.  Skin: Skin is warm and dry.  Psychiatric: She has a normal mood and affect.   Results for orders placed or performed during the hospital encounter of 06/18/14  (from the past 24 hour(s))  Urinalysis, Routine w reflex microscopic (not at [facility])     Status: Abnormal   Collection Time: 06/18/14 11:08 PM  Result Value Ref Range   Color, Urine YELLOW YELLOW   APPearance CLEAR CLEAR   Specific Gravity, Urine 1.020 1.005 - 1.030   pH 6.0 5.0 - 8.0   Glucose, UA NEGATIVE NEGATIVE mg/dL   Hgb urine dipstick LARGE (A) NEGATIVE   Bilirubin Urine NEGATIVE NEGATIVE   Ketones, ur NEGATIVE NEGATIVE mg/dL   Protein, ur NEGATIVE NEGATIVE mg/dL   Urobilinogen, UA 0.2 0.0 - 1.0 mg/dL   Nitrite NEGATIVE NEGATIVE   Leukocytes, UA LARGE (A) NEGATIVE  Urine microscopic-add on     Status: Abnormal   Collection Time: 06/18/14 11:08 PM  Result Value Ref Range   Squamous Epithelial / LPF FEW (A) RARE   WBC, UA 11-20 <3 WBC/hpf   RBC / HPF 7-10 <3 RBC/hpf   Bacteria, UA FEW (A) RARE  CBC     Status: Abnormal   Collection Time: 06/18/14 11:20 PM  Result Value Ref Range   WBC 7.5 4.0 - 10.5 K/uL   RBC 3.48 (L) 3.87 - 5.11 MIL/uL   Hemoglobin 10.4 (L) 12.0 - 15.0 g/dL   HCT 31.4 (L) 36.0 - 46.0 %   MCV 90.2 78.0 - 100.0 fL   MCH 29.9 26.0 - 34.0 pg   MCHC 33.1 30.0 - 36.0 g/dL   RDW 12.3 11.5 - 15.5 %   Platelets 301 150 - 400 K/uL  hCG, quantitative, pregnancy     Status: Abnormal   Collection Time: 06/18/14 11:20 PM  Result Value Ref Range   hCG, Beta Chain, Quant, S 1356 (H) <5 mIU/mL    MAU Course  Procedures  MDM   Assessment and Plan   1. SAB (spontaneous abortion)    DC home Comfort measures reviewed   Bleeding precautions RX: ibuprofen 800mg  #30 Return to MAU as needed   Follow-up Information    Follow up with [facility].   Specialty:  Obstetrics and Gynecology   Why:  06/30/14 between 1:00-3:00pm for repeat blood work.    Contact information:   Faith [place] [phone]        Mathis Bud 06/18/2014, 11:33 PM

## 2014-06-19 ENCOUNTER — Ambulatory Visit (HOSPITAL_COMMUNITY): Admission: RE | Admit: 2014-06-19 | Payer: Self-pay | Source: Ambulatory Visit

## 2014-06-19 ENCOUNTER — Other Ambulatory Visit: Payer: Medicaid Other

## 2014-06-19 DIAGNOSIS — O039 Complete or unspecified spontaneous abortion without complication: Secondary | ICD-10-CM | POA: Diagnosis not present

## 2014-06-19 MED ORDER — IBUPROFEN 800 MG PO TABS: 800.0000 mg | ORAL_TABLET | Freq: Three times a day (TID) | ORAL | Status: DC | PRN

## 2014-06-19 NOTE — Discharge Instructions (Signed)
Miscarriage A miscarriage is the sudden loss of an unborn baby (fetus) before the 20th week of pregnancy. Most miscarriages happen in the first 3 months of pregnancy. Sometimes, it happens before a woman even knows she is pregnant. A miscarriage is also called a "spontaneous miscarriage" or "early pregnancy loss." Having a miscarriage can be an emotional experience. Talk with your caregiver about any questions you may have about miscarrying, the grieving process, and your future pregnancy plans. CAUSES   Problems with the fetal chromosomes that make it impossible for the baby to develop normally. Problems with the baby's genes or chromosomes are most often the result of errors that occur, by chance, as the embryo divides and grows. The problems are not inherited from the parents.  Infection of the cervix or uterus.   Hormone problems.   Problems with the cervix, such as having an incompetent cervix. This is when the tissue in the cervix is not strong enough to hold the pregnancy.   Problems with the uterus, such as an abnormally shaped uterus, uterine fibroids, or congenital abnormalities.   Certain medical conditions.   Smoking, drinking alcohol, or taking illegal drugs.   Trauma.  Often, the cause of a miscarriage is unknown.  SYMPTOMS   Vaginal bleeding or spotting, with or without cramps or pain.  Pain or cramping in the abdomen or lower back.  Passing fluid, tissue, or blood clots from the vagina. DIAGNOSIS  Your caregiver will perform a physical exam. You may also have an ultrasound to confirm the miscarriage. Blood or urine tests may also be ordered. TREATMENT   Sometimes, treatment is not necessary if you naturally pass all the fetal tissue that was in the uterus. If some of the fetus or placenta remains in the body (incomplete miscarriage), tissue left behind may become infected and must be removed. Usually, a dilation and curettage (D and C) procedure is performed.  During a D and C procedure, the cervix is widened (dilated) and any remaining fetal or placental tissue is gently removed from the uterus.  Antibiotic medicines are prescribed if there is an infection. Other medicines may be given to reduce the size of the uterus (contract) if there is a lot of bleeding.  If you have Rh negative blood and your baby was Rh positive, you will need a Rh immunoglobulin shot. This shot will protect any future baby from having Rh blood problems in future pregnancies. HOME CARE INSTRUCTIONS   Your caregiver may order bed rest or may allow you to continue light activity. Resume activity as directed by your caregiver.  Have someone help with home and family responsibilities during this time.   Keep track of the number of sanitary pads you use each day and how soaked (saturated) they are. Write down this information.   Do not use tampons. Do not douche or have sexual intercourse until approved by your caregiver.   Only take over-the-counter or prescription medicines for pain or discomfort as directed by your caregiver.   Do not take aspirin. Aspirin can cause bleeding.   Keep all follow-up appointments with your caregiver.   If you or your partner have problems with grieving, talk to your caregiver or seek counseling to help cope with the pregnancy loss. Allow enough time to grieve before trying to get pregnant again.  SEEK IMMEDIATE MEDICAL CARE IF:   You have severe cramps or pain in your back or abdomen.  You have a fever.  You pass large blood clots (walnut-sized   or larger) ortissue from your vagina. Save any tissue for your caregiver to inspect.   Your bleeding increases.   You have a thick, bad-smelling vaginal discharge.  You become lightheaded, weak, or you faint.   You have chills.  MAKE SURE YOU:  Understand these instructions.  Will watch your condition.  Will get help right away if you are not doing well or get  worse. Document Released: 06/22/2000 Document Revised: 04/23/2012 Document Reviewed: 02/15/2011 Exit[employer] Patient Information 2015 [employer]. This information is not intended to replace advice given to you by your health care provider. Make sure you discuss any questions you have with your health care provider.  

## 2014-06-30 ENCOUNTER — Telehealth: Payer: Self-pay | Admitting: *Deleted

## 2014-06-30 ENCOUNTER — Other Ambulatory Visit: Payer: Medicaid Other

## 2014-06-30 ENCOUNTER — Other Ambulatory Visit (HOSPITAL_COMMUNITY)
Admission: RE | Admit: 2014-06-30 | Discharge: 2014-06-30 | Disposition: A | Discharge status: Home / Self Care | Payer: Medicaid Other | Source: Ambulatory Visit | Attending: Family Medicine | Admitting: Family Medicine

## 2014-06-30 DIAGNOSIS — O4691 Antepartum hemorrhage, unspecified, first trimester: Secondary | ICD-10-CM | POA: Diagnosis present

## 2014-06-30 DIAGNOSIS — O209 Hemorrhage in early pregnancy, unspecified: Secondary | ICD-10-CM

## 2014-06-30 LAB — HCG, QUANTITATIVE, PREGNANCY: hCG, Beta Chain, Quant, S: 645 m[IU]/mL — ABNORMAL HIGH (ref <5)

## 2014-06-30 NOTE — Progress Notes (Unsigned)
Pt in clinic for stat BHcg, patient denies any heavy vaginal bleeding or pain today.  Educated patient on sign/symptoms to return to MAU. Pt verbalizes understanding and states we can leave a message on her voicemail.

## 2014-06-30 NOTE — Telephone Encounter (Signed)
Pt states we can leave a detailed message on secure voicemail.  Results of Bhcg given, pt to come into clinic in one week for redraw of labs.

## 2014-07-07 ENCOUNTER — Other Ambulatory Visit: Payer: Medicaid Other

## 2014-07-07 DIAGNOSIS — O039 Complete or unspecified spontaneous abortion without complication: Secondary | ICD-10-CM

## 2014-07-08 LAB — HIV ANTIBODY (ROUTINE TESTING W REFLEX): HIV: NONREACTIVE

## 2014-07-08 LAB — HCG, QUANTITATIVE, PREGNANCY: hCG, Beta Chain, Quant, S: 348.2 m[IU]/mL

## 2014-07-16 ENCOUNTER — Ambulatory Visit (INDEPENDENT_AMBULATORY_CARE_PROVIDER_SITE_OTHER): Payer: Medicaid Other | Admitting: Obstetrics & Gynecology

## 2014-07-16 ENCOUNTER — Encounter: Payer: Self-pay | Admitting: Obstetrics & Gynecology

## 2014-07-16 VITALS — BP 110/65 | HR 74 | Temp 98.6°F | Ht 66.0 in | Wt 173.5 lb

## 2014-07-16 DIAGNOSIS — O2690 Pregnancy related conditions, unspecified, unspecified trimester: Secondary | ICD-10-CM

## 2014-07-16 DIAGNOSIS — O0281 Inappropriate change in quantitative human chorionic gonadotropin (hCG) in early pregnancy: Secondary | ICD-10-CM | POA: Diagnosis present

## 2014-07-16 LAB — COMPREHENSIVE METABOLIC PANEL
ALK PHOS: 67 U/L (ref 39–117)
ALT: 10 U/L (ref 0–35)
AST: 15 U/L (ref 0–37)
Albumin: 3.5 g/dL (ref 3.5–5.2)
BUN: 9 mg/dL (ref 6–23)
CO2: 25 mEq/L (ref 19–32)
Calcium: 9 mg/dL (ref 8.4–10.5)
Chloride: 102 mEq/L (ref 96–112)
Creat: 0.84 mg/dL (ref 0.50–1.10)
Glucose, Bld: 79 mg/dL (ref 70–99)
POTASSIUM: 3.9 meq/L (ref 3.5–5.3)
Sodium: 137 mEq/L (ref 135–145)
Total Bilirubin: 0.3 mg/dL (ref 0.2–1.2)
Total Protein: 7.1 g/dL (ref 6.0–8.3)

## 2014-07-16 LAB — CBC
HEMATOCRIT: 32.2 % — AB (ref 36.0–46.0)
Hemoglobin: 10.5 g/dL — ABNORMAL LOW (ref 12.0–15.0)
MCH: 28.9 pg (ref 26.0–34.0)
MCHC: 32.6 g/dL (ref 30.0–36.0)
MCV: 88.7 fL (ref 78.0–100.0)
MPV: 9.5 fL (ref 8.6–12.4)
PLATELETS: 299 10*3/uL (ref 150–400)
RBC: 3.63 MIL/uL — ABNORMAL LOW (ref 3.87–5.11)
RDW: 12.6 % (ref 11.5–15.5)
WBC: 6.9 10*3/uL (ref 4.0–10.5)

## 2014-07-16 NOTE — Progress Notes (Signed)
CLINIC ENCOUNTER NOTE  History:  49 y.o. [id number] here today for follow up from SAB with complicated course. Patient was diagnosed with missed Ab on 05/29/2014 and failed medical management with cytotec. She reports that she never experienced an episode of heavy vaginal bleeding. She was scheduled for D&C on 06/04/2014 but intraoperative ultrasound demonstrated no intrauterine pregnancy and the previously seen gestational sac was no longer visible. She has been having serial HCG draws that have been coming down but slowly, so she was told to come in for further discussion about the slow decrease of HCG.    Results for DELIAH, STREHLOW (MRN [id number]) as of 07/16/2014 16:37  Ref. Range 06/12/2014 15:12 06/18/2014 23:20 06/30/2014 11:32 07/07/2014 12:15  HCG, Beta Chain, Quant, S Latest Units: mIU/mL 2515.9 1356 (H) 645 (H) 348.2   She was scheduled for [place] on 07/09/2014 but this was cancelled given that her HCG was still positive.   Patient reports today that she is experiencing occasional dark discharge but nothing comparable to a period. She denies any cramping pain. She is very frustrated with this whole process and wants it to be over.   Past Medical History  Diagnosis Date  . Fibroids     Past Surgical History  Procedure Laterality Date  . Examination under anesthesia N/A 06/04/2014    Procedure: EXAM UNDER ANESTHESIA;  Surgeon: Guss Bunde, MD;  Location: [facility];  Service: Gynecology;  Laterality: N/A;  . Operative ultrasound N/A 06/04/2014    Procedure: OPERATIVE ULTRASOUND;  Surgeon: Guss Bunde, MD;  Location: [facility];  Service: Gynecology;  Laterality: N/A;    The following portions of the patient's history were reviewed and updated as appropriate: allergies, current medications, past family history, past medical history, past social history, past surgical history and problem list.   Health Maintenance:  Normal pap and negative HRHPV on 05/13/2010.     Review of Systems:  Pertinent  items are noted in HPI. Comprehensive review of systems was otherwise negative.  Objective:  Physical Exam BP 110/65 mmHg  Pulse 74  Temp(Src) 98.6 F (37 C)  Ht 5\' 6"  (1.676 m)  Wt 173 lb 8 oz (78.699 kg)  BMI 28.02 kg/m2  LMP 05/07/2014 (Approximate) CONSTITUTIONAL: Well-developed, well-nourished female in no acute distress.  HENT:  Normocephalic, atraumatic. External right and left ear normal. Oropharynx is clear and moist EYES: Conjunctivae and EOM are normal. Pupils are equal, round, and reactive to light. No scleral icterus.  NECK: Normal range of motion, supple, no masses SKIN: Skin is warm and dry. No rash noted. Not diaphoretic. No erythema. No pallor. [place]: Alert and oriented to person, place, and time. Normal reflexes, muscle tone coordination. No cranial nerve deficit noted. PSYCHIATRIC: Normal mood and affect. Normal behavior. Normal judgment and thought content. CARDIOVASCULAR: Normal heart rate noted RESPIRATORY: Effort and breath sounds normal, no problems with respiration noted ABDOMEN: Soft, no distention noted.   PELVIC: Deferred MUSCULOSKELETAL: Normal range of motion. No edema noted.  Labs and Imaging  Ref. Range 06/12/2014 15:12 06/18/2014 23:20 06/30/2014 11:32 07/07/2014 12:15  HCG, Beta Chain, Quant, S Latest Units: mIU/mL 2515.9 1356 (H) 645 (H) 348.2    Assessment & Plan:  1. Abnormal pregnancy, unspecified trimester Given slow decline of HCG, discussed different options of management with patient in detail: Expectant management vs Methotrexate vs Hysterectomy as definitive management (for the fibroids too).  Patient wants to wait and see results, then decide on what she wants to do - CBC -  Comprehensive metabolic panel - hCG, quantitative, pregnancy Please refer to After Visit Summary for other counseling recommendations.    Total face-to-face time with patient: 25 minutes. Over 50% of encounter was spent on counseling and coordination of  care.   Verita Schneiders, MD, [place] Attending [place] for [employer], [place]

## 2014-07-16 NOTE — Patient Instructions (Addendum)
MyChart allows you to send messages to your doctor, view your lab results (as released by your physician), manage appointments, and more. To sign up, log on to [email or web address] using the Address Bar in your browser. Once you are logged on, click on the Sign Up Now link and you will access the new member signup page. Enter your MyChart Activation Code exactly as it appears below to complete the sign-up process. If you do not sign up before the expiration date, you must request a new code.  MyChart Activation Code: [email or web address] Expires: 09/14/2014  3:18 PM  If you have questions, you can call [phone] ([phone]) or e-mail [email or web address]  to talk to our [facility] staff. Remember, MyChart is NOT to be used for urgent needs. For medical emergencies, dial 911.  Results for QUANTA, ROBERTSHAW (MRN [id number]) as of 07/16/2014 15:19  Ref. Range 06/12/2014 15:12 06/18/2014 23:20 06/30/2014 11:32 07/07/2014 12:15  HCG, Beta Chain, Quant, S Latest Units: mIU/mL 2515.9 1356 (H) 645 (H) 348.2    Methotrexate Treatment  RISKS AND COMPLICATIONS Generally, this is a safe treatment. However, as with any treatment, problems can occur. Possible problems or side effects include:  Nausea.  Vomiting.  Diarrhea.  Abdominal cramping.  Mouth sores.  Increased vaginal bleeding or spotting.   Swelling or irritation of the lining of your lungs (pneumonitis).  Failed treatment and continuation of the pregnancy.   Liver damage.  Hair loss. BEFORE THE PROCEDURE Before you take the medicine:   Liver tests, kidney tests, and a complete blood test are performed.  Blood tests are performed to measure the pregnancy hormone levels and to determine your blood type.  If you are Rh-negative and the father is Rh-positive or his Rh type is not known, you will be given a Rho (D) immune globulin shot. PROCEDURE  There are two methods that your health care provider may use to prescribe  methotrexate. One method involves a single dose or injection of the medicine. Another method involves a series of doses given through several injections.  AFTER THE PROCEDURE  You may have some abdominal cramping, vaginal bleeding, and fatigue in the first few days after taking methotrexate.  Blood tests will be taken for several weeks to check the pregnancy hormone levels. The blood tests are performed until there is no more pregnancy hormone detected in the blood. Document Released: 12/21/2000 Document Revised: 05/13/2013 Document Reviewed: 10/15/2012 [facility] Patient Information 2015 [place], [place]. This information is not intended to replace advice given to you by your health care provider. Make sure you discuss any questions you have with your health care provider.   Hysterectomy Information  A hysterectomy is a surgery in which your uterus is removed. This surgery may be done to treat various medical problems. After the surgery, you will no longer have menstrual periods. The surgery will also make you unable to become pregnant (sterile). The fallopian tubes and ovaries can be removed (bilateral salpingo-oophorectomy) during this surgery as well.  REASONS FOR A HYSTERECTOMY  Persistent, abnormal bleeding.  Lasting (chronic) pelvic pain or infection.  The lining of the uterus (endometrium) starts growing outside the uterus (endometriosis).  The endometrium starts growing in the muscle of the uterus (adenomyosis).  The uterus falls down into the vagina (pelvic organ prolapse).  Noncancerous growths in the uterus (uterine fibroids) that cause symptoms.  Precancerous cells.  Cervical cancer or uterine cancer. TYPES OF HYSTERECTOMIES  Supracervical hysterectomy--In this type, the top part of the uterus is removed, but not  the cervix.  Total hysterectomy--The uterus and cervix are removed.  Radical hysterectomy--The uterus, the cervix, and the fibrous tissue that holds the uterus  in place in the pelvis (parametrium) are removed. WAYS A HYSTERECTOMY CAN BE PERFORMED  Abdominal hysterectomy--A large surgical cut (incision) is made in the abdomen. The uterus is removed through this incision.  Vaginal hysterectomy--An incision is made in the vagina. The uterus is removed through this incision. There are no abdominal incisions.  Conventional laparoscopic hysterectomy--Three or four small incisions are made in the abdomen. A thin, lighted tube with a camera (laparoscope) is inserted into one of the incisions. Other tools are put through the other incisions. The uterus is cut into small pieces. The small pieces are removed through the incisions, or they are removed through the vagina.  Laparoscopically assisted vaginal hysterectomy (LAVH)--Three or four small incisions are made in the abdomen. Part of the surgery is performed laparoscopically and part vaginally. The uterus is removed through the vagina.  Robot-assisted laparoscopic hysterectomy--A laparoscope and other tools are inserted into 3 or 4 small incisions in the abdomen. A computer-controlled device is used to give the surgeon a 3D image and to help control the surgical instruments. This allows for more precise movements of surgical instruments. The uterus is cut into small pieces and removed through the incisions or removed through the vagina. RISKS AND COMPLICATIONS  Possible complications associated with this procedure include:  Bleeding and risk of blood transfusion. Tell your health care provider if you do not want to receive any blood products.  Blood clots in the legs or lung.  Infection.  Injury to surrounding organs.  Problems or side effects related to anesthesia.  Conversion to an abdominal hysterectomy from one of the other techniques. WHAT TO EXPECT AFTER A HYSTERECTOMY  You will be given pain medicine.  You will need to have someone with you for the first 3-5 days after you go home.  You will  need to follow up with your surgeon in 2-4 weeks after surgery to evaluate your progress.  You may have early menopause symptoms such as hot flashes, night sweats, and insomnia.  If you had a hysterectomy for a problem that was not cancer or not a condition that could lead to cancer, then you no longer need Pap tests. However, even if you no longer need a Pap test, a regular exam is a good idea to make sure no other problems are starting. Document Released: 06/22/2000 Document Revised: 10/17/2012 Document Reviewed: 09/03/2012 [facility] Patient Information 2015 [place], [place]. This information is not intended to replace advice given to you by your health care provider. Make sure you discuss any questions you have with your health care provider.

## 2014-07-17 LAB — HCG, QUANTITATIVE, PREGNANCY: HCG, BETA CHAIN, QUANT, S: 270.9 m[IU]/mL

## 2014-07-17 NOTE — Progress Notes (Signed)
Quick Note:  HCG level is definitely not going down appropriately, need to consider Methotrexate therapy or definitive surgery as discussed during her 07/16/14 visit. Patient called and informed of results; she will consider Methotrexate and make decision soon; will call clinic once she is ready to proceed with this option.   HCG levels 06/12/2014 2515.9 06/18/2014 1356 06/30/2014 645  07/07/2014 348.2 07/16/2014 270.9  She has normal pre-Methotrexate labs, can be dosed just like we do for ectopic pregnancy at 50 mg/m2; can be administered in MAU. After administration, she will still need weekly HCG checks in clinic.  Verita Schneiders, MD, [place] Attending [place] for [facility]  ______

## 2014-07-18 ENCOUNTER — Telehealth: Payer: Self-pay | Admitting: General Practice

## 2014-07-18 NOTE — Telephone Encounter (Signed)
Called patient in regards to decision about methotrexate. Patient states she thinks she has decided to go ahead and do the shot of mtx. Told patient she can go to MAU at any time for that. Patient verbalized understanding and had no questions

## 2014-07-19 ENCOUNTER — Inpatient Hospital Stay (HOSPITAL_COMMUNITY)
Admission: AD | Admit: 2014-07-19 | Discharge: 2014-07-19 | Disposition: A | Discharge status: Home / Self Care | Payer: Medicaid Other | Source: Ambulatory Visit | Attending: Obstetrics and Gynecology | Admitting: Obstetrics and Gynecology

## 2014-07-19 DIAGNOSIS — O0281 Inappropriate change in quantitative human chorionic gonadotropin (hCG) in early pregnancy: Secondary | ICD-10-CM | POA: Insufficient documentation

## 2014-07-19 DIAGNOSIS — O009 Ectopic pregnancy, unspecified: Secondary | ICD-10-CM | POA: Diagnosis not present

## 2014-07-19 MED ORDER — METHOTREXATE INJECTION FOR WOMEN'S HOSPITAL
50.0000 mg/m2 | Freq: Once | INTRAMUSCULAR | Status: AC
  Administered 2014-07-19: 95 mg via INTRAMUSCULAR
  Filled 2014-07-19: qty 1.9

## 2014-07-19 NOTE — MAU Provider Note (Deleted)
Subjective:  [name] is a 49 y.o. female [phone] here for methotrexate therapy for presumed ectopic. She saw Dr. Harolyn Rutherford on 7/6 for a follow up appointment for SAB which was a complicated course. She was told to come in to the office on 7/6  for further discussion about the slow decrease of HCG.   The patient was diagnosed with missed Ab on 05/29/2014 and failed medical management with cytotec. She reports that she never experienced an episode of heavy vaginal bleeding. She was scheduled for D&C on 06/04/2014 but intraoperative ultrasound demonstrated no intrauterine pregnancy and the previously seen gestational sac was no longer visible. She has been having serial HCG draws that have been coming down but slowly.   The patient was offered  Methotrexate therapy or definitive surgery. The patient wanted some time to think about her options and was instructed to call back with her decision.   She called the office on 7/8 and informed the staff that she would like to proceed with MTX therapy. The patient was told to come to MAU ASAP.   Patient denies pain or bleeding     HCG levels:  06/12/2014 2515.9 06/18/2014 1356 06/30/2014 645  07/07/2014 348.2 07/16/2014 270.9  Objective:  GENERAL: Well-developed, well-nourished female in no acute distress.  LUNGS: Effort normal SKIN: Warm, dry and without erythema PSYCH: Normal mood and affect  Filed Vitals:   07/19/14 1037  BP: 103/79  Pulse: 68  Temp: 98 F (36.7 C)  Resp: 18   MDM:  Reviewed all previous charts/records and US findings with this pregnancy. Discussed this with Dr. Elly Modena who agrees with plan of care.   Assessment:  1. Ectopic pregnancy     Plan:  Discharge home in stable condition Return on Tuesday for Day #4 labs  Ectopic precautions Pelvic rest  Return to MAU if symptoms worsen     Lezlie Lye, NP 07/19/2014 11:58 AM

## 2014-07-19 NOTE — MAU Note (Signed)
Pt states here for MTX injection only. Has mild pain, rates 1/10. Also now starting to bleed.

## 2014-07-19 NOTE — MAU Note (Signed)
Pt previously given miscarriage packet.

## 2014-07-19 NOTE — Discharge Instructions (Signed)
Ectopic Pregnancy An ectopic pregnancy happens when a fertilized egg grows outside the uterus. A pregnancy cannot live outside of the uterus. This problem often happens in the fallopian tube. It is often caused by damage to the fallopian tube. If this problem is found early, you may be treated with medicine. If your tube tears or bursts open (ruptures), you will bleed inside. This is an emergency. You will need surgery. Get help right away.  SYMPTOMS You may have normal pregnancy symptoms at first. These include:  Missing your period.  Feeling sick to your stomach (nauseous).  Being tired.  Having tender breasts. Then, you may start to have symptoms that are not normal. These include:  Pain with sex (intercourse).  Bleeding from the vagina. This includes light bleeding (spotting).  Belly (abdomen) or lower belly cramping or pain. This may be felt on one side.  A fast heartbeat (pulse).  Passing out (fainting) after going poop (bowel movement). If your tube tears, you may have symptoms such as:  Really bad pain in the belly or lower belly. This happens suddenly.  Dizziness.  Passing out.  Shoulder pain. GET HELP RIGHT AWAY IF:  You have any of these symptoms. This is an emergency. MAKE SURE YOU:  Understand these instructions.  Will watch your condition.  Will get help right away if you are not doing well or get worse. Document Released: 03/25/2008 Document Revised: 01/01/2013 Document Reviewed: 08/08/2012 [facility] Patient Information 2015 [place], [place]. This information is not intended to replace advice given to you by your health care provider. Make sure you discuss any questions you have with your health care provider.  Methotrexate Treatment for an Ectopic Pregnancy, Care After Refer to this sheet in the next few weeks. These instructions provide you with information on caring for yourself after your procedure. Your health care provider may also give you more  specific instructions. Your treatment has been planned according to current medical practices, but problems sometimes occur. Call your health care provider if you have any problems or questions after your procedure. WHAT TO EXPECT AFTER THE PROCEDURE You may have some abdominal cramping, vaginal bleeding, and fatigue in the first few days after taking methotrexate. Some other possible side effects of methotrexate include:  Nausea.  Vomiting.  Diarrhea.  Mouth sores.  Swelling or irritation of the lining of your lungs (pneumonitis).  Liver damage.  Hair loss. HOME CARE INSTRUCTIONS  After you have received the methotrexate medicine, you need to be careful of your activities and watch your condition for several weeks. It may take 1 week before your hormone levels return to normal.  Keep all follow-up appointments as directed by your health care provider.  Avoid traveling too far away from your health care provider.  Do not have sexual intercourse until your health care provider says it is safe to do so.  You may resume your usual diet.  Limit strenuous activity.  Do not take folic acid, prenatal vitamins, or other vitamins that contain folic acid.  Do not take aspirin, ibuprofen, or naproxen (nonsteroidal anti-inflammatory drugs [NSAIDs]).  Do not drink alcohol. SEEK MEDICAL CARE IF:   You cannot control your nausea and vomiting.  You cannot control your diarrhea.  You have sores in your mouth and want treatment.  You need pain medicine for your abdominal pain.  You have a rash.  You are having a reaction to the medicine. SEEK IMMEDIATE MEDICAL CARE IF:   You have increasing abdominal or pelvic pain.  You notice increased bleeding.  You feel light-headed, or you faint.  You have shortness of breath.  Your heart rate increases.  You have a cough.  You have chills.  You have a fever. Document Released: 12/16/2010 Document Revised: 01/01/2013 Document  Reviewed: 10/15/2012 [facility] Patient Information 2015 [place], [place]. This information is not intended to replace advice given to you by your health care provider. Make sure you discuss any questions you have with your health care provider.

## 2014-07-21 NOTE — MAU Provider Note (Signed)
Subjective:  [name] is a 49 y.o. female [phone] here for methotrexate therapy for inappropriate change in quantitative hCG in early pregnancy. She saw Dr. Harolyn Rutherford on 7/6 for a follow up appointment for SAB which was a complicated course. She was told to come in to the office on 7/6  for further discussion about the slow decrease of HCG.   The patient was diagnosed with missed Ab on 05/29/2014 and failed medical management with cytotec. She reports that she never experienced an episode of heavy vaginal bleeding. She was scheduled for D&C on 06/04/2014 but intraoperative ultrasound demonstrated no intrauterine pregnancy and the previously seen gestational sac was no longer visible. She has been having serial HCG draws that have been coming down but slowly.   The patient was offered  Methotrexate therapy or definitive surgery. The patient wanted some time to think about her options and was instructed to call back with her decision.   She called the office on 7/8 and informed the staff that she would like to proceed with MTX therapy. The patient was told to come to MAU ASAP.   Patient denies pain or bleeding   HCG levels: 06/12/2014 2515.9 06/18/2014 1356 06/30/2014 645  07/07/2014 348.2 07/16/2014 270.9  Objective: GENERAL: Well-developed, well-nourished female in no acute distress.  LUNGS: Effort normal SKIN: Warm, dry and without erythema PSYCH: Normal mood and affect  Filed Vitals:   07/19/14 1037  BP: 103/79  Pulse: 68  Temp: 98 F (36.7 C)  Resp: 18   PPI:[id number] given as per protocol  Reviewed all previous charts/records and US findings with this pregnancy. Discussed this with Dr. Elly Modena who agrees with plan of care.   Assessment:  1. Inappropriate change in quantitative hCG in early pregnancy     Plan:  Discharge home in stable condition Return on Tuesday for Day #4 labs  Pelvic rest  Return to MAU if symptoms worsen    Lezlie Lye,  NP 07/21/2014 9:42 AM     Attestation of Attending Supervision of Advanced Practitioner (PA/CNM/NP): Evaluation and management procedures were performed by the Advanced Practitioner under my supervision and collaboration.  I have reviewed the Advanced Practitioner's note and chart, and I agree with the management and plan.  Verita Schneiders, MD, [place] Attending [place], [facility]

## 2014-07-22 ENCOUNTER — Inpatient Hospital Stay (HOSPITAL_COMMUNITY)
Admission: AD | Admit: 2014-07-22 | Discharge: 2014-07-22 | Disposition: A | Discharge status: Home / Self Care | Payer: Medicaid Other | Source: Ambulatory Visit | Attending: Family Medicine | Admitting: Family Medicine

## 2014-07-22 DIAGNOSIS — Z09 Encounter for follow-up examination after completed treatment for conditions other than malignant neoplasm: Secondary | ICD-10-CM

## 2014-07-22 DIAGNOSIS — O0281 Inappropriate change in quantitative human chorionic gonadotropin (hCG) in early pregnancy: Secondary | ICD-10-CM | POA: Diagnosis present

## 2014-07-22 LAB — HCG, QUANTITATIVE, PREGNANCY: HCG, BETA CHAIN, QUANT, S: 126 m[IU]/mL — AB (ref <5)

## 2014-07-22 NOTE — MAU Note (Signed)
Patient left immediately after blood work drawn. Called patient at home per NP request.  Informed patient that she would still need to come back on Friday for her day 7 follow up lab draw & that she would need to wait for her results.  Patient verbalized understanding.

## 2014-07-22 NOTE — MAU Note (Signed)
Not in the lobby #1

## 2014-07-22 NOTE — MAU Note (Signed)
Pt not in lobby.  

## 2014-07-22 NOTE — MAU Provider Note (Signed)
Maria Logan is a 49 y.o. female who presents to the MAU for follow up Bhcg s/p MTX. This is day 4 follow up. She denied any problems.      Ref Range 6d ago  2wk ago  3wk ago  28mo ago     hCG, Beta Chain, Quant, S mIU/mL 270.9 348.2CM 645 (H)R, CM 1356 (H)R, CM       Today the Bhcg has dropped to 126.   Results for orders placed or performed during the hospital encounter of 07/22/14 (from the past 24 hour(s))  hCG, quantitative, pregnancy     Status: Abnormal   Collection Time: 07/22/14  5:34 PM  Result Value Ref Range   hCG, Beta Chain, Quant, S 126 (H) <5 mIU/mL    Will have patient return day 7 for repeat Bhcg and she will continue ectopic precautions.   Called patient to get vital signs and discuss results and she had left after blood was drawn. Erin, RN called patient and discussed results and need for follow up on day 7 after MTX. Patient voices understanding and agrees with plan. She also discussed with the patient that on her next visit to stay until the results are back so we can discuss plan of care. She will continue strict ectopic precautions.

## 2014-07-23 ENCOUNTER — Telehealth: Payer: Self-pay

## 2014-07-23 NOTE — Telephone Encounter (Addendum)
Pt called the front desk and stated that she has receives methotrexate and receives beta levels from MAU.  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  I informed pt that she should be ok with getting TB skin testing.

## 2014-07-25 ENCOUNTER — Inpatient Hospital Stay (HOSPITAL_COMMUNITY)
Admission: AD | Admit: 2014-07-25 | Discharge: 2014-07-25 | Disposition: A | Discharge status: Home / Self Care | Payer: Medicaid Other | Source: Ambulatory Visit | Attending: Family Medicine | Admitting: Family Medicine

## 2014-07-25 DIAGNOSIS — O0281 Inappropriate change in quantitative human chorionic gonadotropin (hCG) in early pregnancy: Secondary | ICD-10-CM | POA: Diagnosis present

## 2014-07-25 LAB — HCG, QUANTITATIVE, PREGNANCY: hCG, Beta Chain, Quant, S: 87 m[IU]/mL — ABNORMAL HIGH (ref <5)

## 2014-07-25 NOTE — MAU Note (Signed)
Not in lobby

## 2014-07-25 NOTE — MAU Provider Note (Signed)
History     CSN: [id number]  Arrival date and time: 07/25/14 1714   First Provider Initiated Contact with Patient 07/25/14 1842      Chief Complaint  Patient presents with  . Follow-up   HPI Maria Logan 49 y.o. [id number] @Unknown  presents for follow up Day 7 after MTX.  She notes that she is continuing to have vaginal bleeding but denies pain, weakness, fever, dysuria, SOB.   OB History    Gravida Para Term Preterm AB TAB SAB Ectopic Multiple Living   4 3 3       3       Past Medical History  Diagnosis Date  . Fibroids     Past Surgical History  Procedure Laterality Date  . Examination under anesthesia N/A 06/04/2014    Procedure: EXAM UNDER ANESTHESIA;  Surgeon: Guss Bunde, MD;  Location: [facility];  Service: Gynecology;  Laterality: N/A;  . Operative ultrasound N/A 06/04/2014    Procedure: OPERATIVE ULTRASOUND;  Surgeon: Guss Bunde, MD;  Location: [facility];  Service: Gynecology;  Laterality: N/A;    Family History  Problem Relation Age of Onset  . Alcohol abuse Neg Hx   . Arthritis Neg Hx   . Asthma Neg Hx   . Birth defects Neg Hx   . Cancer Neg Hx   . COPD Neg Hx   . Depression Neg Hx   . Diabetes Neg Hx   . Drug abuse Neg Hx   . Early death Neg Hx   . Hearing loss Neg Hx   . Heart disease Neg Hx   . Hyperlipidemia Neg Hx   . Hypertension Neg Hx   . Kidney disease Neg Hx   . Learning disabilities Neg Hx   . Mental illness Neg Hx   . Mental retardation Neg Hx   . Miscarriages / Stillbirths Neg Hx   . Stroke Neg Hx   . Vision loss Neg Hx   . Varicose Veins Neg Hx     History  Substance Use Topics  . Smoking status: Never Smoker   . Smokeless tobacco: Never Used  . Alcohol Use: No    Allergies: No Known Allergies  Prescriptions prior to admission  Medication Sig Dispense Refill Last Dose  . [employer] D3 1000 UNITS capsule Take 1,000 Units by mouth daily.  3 Not Taking  . HYDROcodone-acetaminophen (NORCO) 5-325 MG per tablet Take 1-2 tablets by  mouth every 4 hours as needed for pain (Patient not taking: Reported on 07/16/2014) 20 tablet 0 Not Taking    ROS Pertinent ROS in HPI.  All other systems are negative.   Physical Exam   Blood pressure 113/71, pulse 77, temperature 98.3 F (36.8 C), temperature source Oral, resp. rate 18.  Physical Exam  Constitutional: She is oriented to person, place, and time. She appears well-developed and well-nourished. No distress.  HENT:  Head: Normocephalic and atraumatic.  Neck: Normal range of motion.  Cardiovascular: Normal rate.   Respiratory: Effort normal. No respiratory distress.  Musculoskeletal: Normal range of motion.  Neurological: She is alert and oriented to person, place, and time.  Skin: Skin is warm and dry.  Psychiatric: She has a normal mood and affect.   Results for orders placed or performed during the hospital encounter of 07/25/14 (from the past 24 hour(s))  hCG, quantitative, pregnancy     Status: Abnormal   Collection Time: 07/25/14  5:50 PM  Result Value Ref Range   hCG, Beta Chain, Quant,  S 87 (H) <5 mIU/mL   Prior value of bHCG 3 days ago of 126  MAU Course  Procedures  MDM Pt has had >15% drop in HCG.  MTX seems to be effective.  No repeat dosing required.   Assessment and Plan  A: Day 7 MTX follow up for inappropriate change in HCG in early pregnancy  P: Discharge to home F/u in clinic in 1 week for HCG Continue working with clinic on fibroid issue Return to MAU for any worsening of symptoms  Paticia Stack 07/25/2014, 6:46 PM

## 2014-07-25 NOTE — MAU Note (Signed)
Here for day 7 post MTX. States is hanging in there.  Bleeding is now kind of heavy. No pain

## 2014-07-28 ENCOUNTER — Other Ambulatory Visit: Payer: Medicaid Other

## 2014-07-28 DIAGNOSIS — O0281 Inappropriate change in quantitative human chorionic gonadotropin (hCG) in early pregnancy: Secondary | ICD-10-CM | POA: Insufficient documentation

## 2014-07-29 ENCOUNTER — Telehealth: Payer: Self-pay | Admitting: General Practice

## 2014-07-29 LAB — HCG, QUANTITATIVE, PREGNANCY: hCG, Beta Chain, Quant, S: 58.2 m[IU]/mL

## 2014-07-29 NOTE — Telephone Encounter (Signed)
Called patient regarding bhcg results and need for follow up in one week. No answer- left message stating we are trying to reach you with results, please call us back at the clinics

## 2014-07-30 NOTE — Telephone Encounter (Signed)
Pt returned call and requested results for hcg.  I informed pt of results and the provider requests another lab draw on Monday 08/04/14 to make sure her levels go to  <2.  Pt stated that she could possibly get lab draw between 3-4 on 08/04/14.

## 2014-08-04 ENCOUNTER — Other Ambulatory Visit: Payer: Medicaid Other

## 2014-08-04 DIAGNOSIS — O039 Complete or unspecified spontaneous abortion without complication: Secondary | ICD-10-CM

## 2014-08-05 ENCOUNTER — Telehealth: Payer: Self-pay | Admitting: *Deleted

## 2014-08-05 LAB — HCG, QUANTITATIVE, PREGNANCY: HCG, BETA CHAIN, QUANT, S: 10.7 m[IU]/mL

## 2014-08-05 NOTE — Telephone Encounter (Addendum)
Pt left message requesting result of HCG test. She is at work currently and stated that a detailed message may be left on her voice mail.  7/27  0950  Spoke w/pt and informed her of HCG level of 10.7.  I also stated that while this is a very good improvement, we still need to check the level weekly until it is below 5. Hopefully that will be with the next test. Pt stated that she is waiting to start her birth control and has been bleeding for 2 months as well as has been passing clots the size of a half dollar for 1 month. She confirmed that she is abstaining from sexual intercourse. I advised that I will discuss her bleeding with the doctor and call her back. Pt voiced understanding and said that a detailed message can be left on her voice mail.  7/27  1220  Spoke w/pt after consult with Dr. Harolyn Rutherford. I advised her that the blood clots she is passing is not a concern. This is normal given her history. She will need BHCG test next week and then clinic appt in 2-3 wks. She can begin birth control now.  Pt stated that she would like to have Nexplanon. I advised that she will receive a call soon with her appt information with Dr. Harolyn Rutherford and will have Nexplanon insertion at that visit. Pt also advised to continue to abstain from sexual intercourse. Pt voiced understanding of all information and instructions.

## 2014-08-11 ENCOUNTER — Other Ambulatory Visit: Payer: Medicaid Other

## 2014-08-11 DIAGNOSIS — O209 Hemorrhage in early pregnancy, unspecified: Secondary | ICD-10-CM

## 2014-08-12 ENCOUNTER — Telehealth: Payer: Self-pay | Admitting: *Deleted

## 2014-08-12 LAB — HCG, QUANTITATIVE, PREGNANCY

## 2014-08-12 NOTE — Telephone Encounter (Signed)
Pt called requesting results of her beta hcg. I informed her that hcg is less than two. Patient had no further questions and will followup for contraception on 8/17.

## 2014-08-27 ENCOUNTER — Encounter: Payer: Self-pay | Admitting: Obstetrics & Gynecology

## 2014-08-27 ENCOUNTER — Ambulatory Visit (INDEPENDENT_AMBULATORY_CARE_PROVIDER_SITE_OTHER): Payer: Medicaid Other | Admitting: Obstetrics & Gynecology

## 2014-08-27 VITALS — BP 106/60 | HR 77 | Temp 98.3°F | Resp 17 | Ht 66.0 in | Wt 173.0 lb

## 2014-08-27 DIAGNOSIS — Z1231 Encounter for screening mammogram for malignant neoplasm of breast: Secondary | ICD-10-CM

## 2014-08-27 DIAGNOSIS — O039 Complete or unspecified spontaneous abortion without complication: Secondary | ICD-10-CM | POA: Diagnosis present

## 2014-08-27 DIAGNOSIS — Z3042 Encounter for surveillance of injectable contraceptive: Secondary | ICD-10-CM | POA: Diagnosis not present

## 2014-08-27 LAB — POCT PREGNANCY, URINE: Preg Test, Ur: NEGATIVE

## 2014-08-27 MED ORDER — MEDROXYPROGESTERONE ACETATE 150 MG/ML IM SUSP
150.0000 mg | INTRAMUSCULAR | Status: DC
  Administered 2014-08-27: 150 mg via INTRAMUSCULAR

## 2014-08-27 NOTE — Progress Notes (Signed)
   CLINIC ENCOUNTER NOTE  History:  49 y.o. [phone] here today for follow up after receiving methotrexate therapy for inappropriate change in quantitative hCG in early pregnancy. Her BHCG finally was  <2 on 08/11/14.  She denies any abnormal vaginal discharge, bleeding, pelvic pain or other concerns. Desires contraception.  Past Medical History  Diagnosis Date  . Fibroids     Past Surgical History  Procedure Laterality Date  . Examination under anesthesia N/A 06/04/2014    Procedure: EXAM UNDER ANESTHESIA;  Surgeon: Guss Bunde, MD;  Location: [facility];  Service: Gynecology;  Laterality: N/A;  . Operative ultrasound N/A 06/04/2014    Procedure: OPERATIVE ULTRASOUND;  Surgeon: Guss Bunde, MD;  Location: [facility];  Service: Gynecology;  Laterality: N/A;   The following portions of the patient's history were reviewed and updated as appropriate: allergies, current medications, past family history, past medical history, past social history, past surgical history and problem list.   Health Maintenance:  Normal pap and negative HRHPV in 05/13/2010  Review of Systems:  Pertinent items are noted in HPI. Comprehensive review of systems was otherwise negative.  Objective:  Physical Exam BP 106/60 mmHg  Pulse 77  Temp(Src) 98.3 F (36.8 C)  Resp 17  Ht 5\' 6"  (1.676 m)  Wt 173 lb (78.472 kg)  BMI 27.94 kg/m2  LMP 08/27/2014 CONSTITUTIONAL: Well-developed, well-nourished female in no acute distress.  HENT:  Normocephalic, atraumatic. External right and left ear normal. Oropharynx is clear and moist EYES: Conjunctivae and EOM are normal. Pupils are equal, round, and reactive to light. No scleral icterus.  NECK: Normal range of motion, supple, no masses SKIN: Skin is warm and dry. No rash noted. Not diaphoretic. No erythema. No pallor. [place]: Alert and oriented to person, place, and time. Normal reflexes, muscle tone coordination. No cranial nerve deficit noted. PSYCHIATRIC: Normal mood  and affect. Normal behavior. Normal judgment and thought content. CARDIOVASCULAR: Normal heart rate noted RESPIRATORY: Effort and breath sounds normal, no problems with respiration noted ABDOMEN: Soft, no distention noted.   PELVIC: Deferred MUSCULOSKELETAL: Normal range of motion. No edema noted.   Assessment & Plan:  Patient is not a good candidate for IUD given irregular uterus and difficulty in getting into endometrial cavity. Recommended pills vs Depo Provera vs Nexplanon; discussed each modality in detail. She desires Depo Provera, this was given today.  She is still contemplating surgery to her fibroid uterus, not ready for that yet. Routine preventative health maintenance measures emphasized; will get annual exam next visit. Please refer to After Visit Summary for other counseling recommendations.    Verita Schneiders, MD, [place] Attending Obstetrician & Gynecologist, [place] for [facility]

## 2014-08-27 NOTE — Patient Instructions (Signed)
Return to clinic for any scheduled appointments or for any gynecologic concerns as needed.   

## 2014-10-14 ENCOUNTER — Emergency Department (HOSPITAL_COMMUNITY)
Admission: EM | Admit: 2014-10-14 | Discharge: 2014-10-14 | Disposition: A | Discharge status: Home / Self Care | Payer: Medicaid Other | Attending: Emergency Medicine | Admitting: Emergency Medicine

## 2014-10-14 ENCOUNTER — Encounter (HOSPITAL_COMMUNITY): Payer: Self-pay | Admitting: Emergency Medicine

## 2014-10-14 DIAGNOSIS — X58XXXA Exposure to other specified factors, initial encounter: Secondary | ICD-10-CM | POA: Insufficient documentation

## 2014-10-14 DIAGNOSIS — S86912A Strain of unspecified muscle(s) and tendon(s) at lower leg level, left leg, initial encounter: Secondary | ICD-10-CM

## 2014-10-14 DIAGNOSIS — Z86018 Personal history of other benign neoplasm: Secondary | ICD-10-CM | POA: Diagnosis not present

## 2014-10-14 DIAGNOSIS — Y9289 Other specified places as the place of occurrence of the external cause: Secondary | ICD-10-CM | POA: Insufficient documentation

## 2014-10-14 DIAGNOSIS — S8392XA Sprain of unspecified site of left knee, initial encounter: Secondary | ICD-10-CM | POA: Diagnosis not present

## 2014-10-14 DIAGNOSIS — Y998 Other external cause status: Secondary | ICD-10-CM | POA: Diagnosis not present

## 2014-10-14 DIAGNOSIS — Y9389 Activity, other specified: Secondary | ICD-10-CM | POA: Insufficient documentation

## 2014-10-14 DIAGNOSIS — S8992XA Unspecified injury of left lower leg, initial encounter: Secondary | ICD-10-CM | POA: Diagnosis present

## 2014-10-14 MED ORDER — NAPROXEN 500 MG PO TABS
500.0000 mg | ORAL_TABLET | Freq: Once | ORAL | Status: AC
  Administered 2014-10-14: 500 mg via ORAL
  Filled 2014-10-14: qty 1

## 2014-10-14 MED ORDER — DICLOFENAC SODIUM 1 % TD GEL: 4.0000 g | Freq: Four times a day (QID) | TRANSDERMAL | Status: DC

## 2014-10-14 NOTE — ED Notes (Signed)
Pt c/o left knee "tightness" x3 weeks. Denies obvious injury to same. Ambulatory in triage.

## 2014-10-14 NOTE — Discharge Instructions (Signed)
Knee Sprain A knee sprain is a tear in one of the strong, fibrous tissues that connect the bones (ligaments) in your knee. The severity of the sprain depends on how much of the ligament is torn. The tear can be either partial or complete. CAUSES  Often, sprains are a result of a fall or injury. The force of the impact causes the fibers of your ligament to stretch too much. This excess tension causes the fibers of your ligament to tear. SIGNS AND SYMPTOMS  You may have some loss of motion in your knee. Other symptoms include:  Bruising.  Pain in the knee area.  Tenderness of the knee to the touch.  Swelling. DIAGNOSIS  To diagnose a knee sprain, your health care provider will physically examine your knee. Your health care provider may also suggest an X-ray exam of your knee to make sure no bones are broken. TREATMENT  If your ligament is only partially torn, treatment usually involves keeping the knee in a fixed position (immobilization) or bracing your knee for activities that require movement for several weeks. To do this, your health care provider will apply a bandage, cast, or splint to keep your knee from moving and to support your knee during movement until it heals. For a partially torn ligament, the healing process usually takes 4-6 weeks. If your ligament is completely torn, depending on which ligament it is, you may need surgery to reconnect the ligament to the bone or reconstruct it. After surgery, a cast or splint may be applied and will need to stay on your knee for 4-6 weeks while your ligament heals. HOME CARE INSTRUCTIONS  Keep your injured knee elevated to decrease swelling.  To ease pain and swelling, apply ice to the injured area:  Put ice in a plastic bag.  Place a towel between your skin and the bag.  Leave the ice on for 20 minutes, 2-3 times a day.  Only take medicine for pain as directed by your health care provider.  Do not leave your knee unprotected until  pain and stiffness go away (usually 4-6 weeks).  If you have a cast or splint, do not allow it to get wet. If you have been instructed not to remove it, cover it with a plastic bag when you shower or bathe. Do not swim.  Your health care provider may suggest exercises for you to do during your recovery to prevent or limit permanent weakness and stiffness. SEEK IMMEDIATE MEDICAL CARE IF:  Your cast or splint becomes damaged.  Your pain becomes worse.  You have significant pain, swelling, or numbness below the cast or splint. MAKE SURE YOU:  Understand these instructions.  Will watch your condition.  Will get help right away if you are not doing well or get worse.   This information is not intended to replace advice given to you by your health care provider. Make sure you discuss any questions you have with your health care provider.   Document Released: 12/27/2004 Document Revised: 01/17/2014 Document Reviewed: 08/08/2012 Elsevier Interactive Patient Education 2016 [place] for Routine Care of Injuries Theroutine careofmanyinjuriesincludes rest, ice, compression, and elevation (RICE therapy). RICE therapy is often recommended for injuries to soft tissues, such as a muscle strain, ligament injuries, bruises, and overuse injuries. It can also be used for some bony injuries. Using RICE therapy can help to relieve pain, lessen swelling, and enable your body to heal. Rest Rest is required to allow your body to heal. This  usually involves reducing your normal activities and avoiding use of the injured part of your body. Generally, you can return to your normal activities when you are comfortable and have been given permission by your health care provider. Ice Icing your injury helps to keep the swelling down, and it lessens pain. Do not apply ice directly to your skin.  Put ice in a plastic bag.  Place a towel between your skin and the bag.  Leave the ice on for 20  minutes, 2-3 times a day. Do this for as long as you are directed by your health care provider. Compression Compression means putting pressure on the injured area. Compression helps to keep swelling down, gives support, and helps with discomfort. Compression may be done with an elastic bandage. If an elastic bandage has been applied, follow these general tips:  Remove and reapply the bandage every 3-4 hours or as directed by your health care provider.  Make sure the bandage is not wrapped too tightly, because this can cut off circulation. If part of your body beyond the bandage becomes blue, numb, cold, swollen, or more painful, your bandage is most likely too tight. If this occurs, remove your bandage and reapply it more loosely.  See your health care provider if the bandage seems to be making your problems worse rather than better. Elevation Elevation means keeping the injured area raised. This helps to lessen swelling and decrease pain. If possible, your injured area should be elevated at or above the level of your heart or the center of your chest. [place]? You should seek medical care if:  Your pain and swelling continue.  Your symptoms are getting worse rather than improving. These symptoms may indicate that further evaluation or further X-rays are needed. Sometimes, X-rays may not show a small broken bone (fracture) until a number of days later. Make a follow-up appointment with your health care provider. WHEN SHOULD I SEEK IMMEDIATE MEDICAL CARE? You should seek immediate medical care if:  You have sudden severe pain at or below the area of your injury.  You have redness or increased swelling around your injury.  You have tingling or numbness at or below the area of your injury that does not improve after you remove the elastic bandage.   This information is not intended to replace advice given to you by your health care provider. Make sure you discuss any  questions you have with your health care provider.   Document Released: 04/10/2000 Document Revised: 09/17/2014 Document Reviewed: 12/04/2013 Elsevier Interactive Patient Education [employer].

## 2014-10-14 NOTE — ED Provider Notes (Signed)
CSN: [id number]     Arrival date & time 10/14/14  2128 History  By signing my name below, I, Tula Nakayama, attest that this documentation has been prepared under the direction and in the presence of [employer], PA-C.  Electronically Signed: Tula Nakayama, ED Scribe. 10/14/2014. 10:53 PM.   No chief complaint on file.  The history is provided by the patient. No language interpreter was used.   HPI Comments: Maria Logan is a 49 y.o. female who presents to the Emergency Department complaining of constant, moderate left knee pain that started 3 weeks ago and became worse a few days ago. She states intermittent, cramping left leg pain and swelling of her knee as associated symptoms. Pt describes pain as a twisting and pulling sensation of her leg. She also notes an intermittent popping sensation as she walks. Pt has not tried any treatment PTA. She endorses jogging for short distances and regular walking. Pt denies any known injury.   Past Medical History  Diagnosis Date  . Fibroids    Past Surgical History  Procedure Laterality Date  . Examination under anesthesia N/A 06/04/2014    Procedure: EXAM UNDER ANESTHESIA;  Surgeon: Guss Bunde, MD;  Location: [facility];  Service: Gynecology;  Laterality: N/A;  . Operative ultrasound N/A 06/04/2014    Procedure: OPERATIVE ULTRASOUND;  Surgeon: Guss Bunde, MD;  Location: [facility];  Service: Gynecology;  Laterality: N/A;   Family History  Problem Relation Age of Onset  . Alcohol abuse Neg Hx   . Arthritis Neg Hx   . Asthma Neg Hx   . Birth defects Neg Hx   . Cancer Neg Hx   . COPD Neg Hx   . Depression Neg Hx   . Diabetes Neg Hx   . Drug abuse Neg Hx   . Early death Neg Hx   . Hearing loss Neg Hx   . Heart disease Neg Hx   . Hyperlipidemia Neg Hx   . Hypertension Neg Hx   . Kidney disease Neg Hx   . Learning disabilities Neg Hx   . Mental illness Neg Hx   . Mental retardation Neg Hx   . Miscarriages / Stillbirths Neg Hx   .  Stroke Neg Hx   . Vision loss Neg Hx   . Varicose Veins Neg Hx    Social History  Substance Use Topics  . Smoking status: Never Smoker   . Smokeless tobacco: Never Used  . Alcohol Use: No   OB History    Gravida Para Term Preterm AB TAB SAB Ectopic Multiple Living   4 3 3       3      Review of Systems  Musculoskeletal: Positive for joint swelling and arthralgias.  All other systems reviewed and are negative.  Allergies  Review of patient's allergies indicates no known allergies.  Home Medications   Prior to Admission medications   Medication Sig Start Date End Date Taking? Authorizing Provider  diclofenac sodium (VOLTAREN) 1 % GEL Apply 4 g topically 4 (four) times daily. 10/14/14   Antonietta Breach, PA-C   LMP 10/14/2014   Physical Exam  Constitutional: She is oriented to person, place, and time. She appears well-developed and well-nourished. No distress.  HENT:  Head: Normocephalic and atraumatic.  Eyes: Conjunctivae and EOM are normal. No scleral icterus.  Neck: Normal range of motion.  Cardiovascular: Normal rate, regular rhythm and intact distal pulses.   DP and PT pulses 2+ in the left  lower extremity  Pulmonary/Chest: Effort normal. No respiratory distress.  Musculoskeletal: Normal range of motion.       Left knee: She exhibits normal range of motion, no deformity, no erythema, normal alignment, no LCL laxity, no bony tenderness and no MCL laxity. Tenderness found. Medial joint line and lateral joint line tenderness noted.  Normal range of motion of the left knee. No swelling, induration, erythema, or heat to touch. No significant effusion noted. No bony deformity or crepitus.  Neurological: She is alert and oriented to person, place, and time. She exhibits normal muscle tone. Coordination normal.  Sensation to light touch intact. Patellar and Achilles reflexes intact bilaterally. Patient ambulatory with steady gait.  Skin: Skin is warm and dry. No rash noted. She is not  diaphoretic. No erythema. No pallor.  Psychiatric: She has a normal mood and affect. Her behavior is normal.  Nursing note and vitals reviewed.   ED Course  Procedures    COORDINATION OF CARE: 10:55 PM Discussed treatment plan with pt which includes referral to the [facility], a supportive knee brace, anti-inflammatory gel and rest. Pt agreed to plan.  MDM   Final diagnoses:  Knee strain, left, initial encounter    49 year old female presents to the emergency department for further evaluation of left knee pain. Symptoms consistent with likely overuse injury. No concern for septic joint or hemarthrosis. No hx of trauma/injury to suggest bony fracture or deformity. Patient ambulatory with steady gait. Will manage with RICE and NSAIDs. Referral given to sports management. Return precautions discussed and provided. Patient agreeable to plan with no unaddressed concerns. Patient discharged in good condition.  I, Paytin Ramakrishnan, personally performed the services described in this documentation. All medical record entries made by the scribe were at my direction and in my presence.  I have reviewed the chart and discharge instructions and agree that the record reflects my personal performance and is accurate and complete. Dusty Raczkowski.  10/15/2014. 1:04 AM.      Antonietta Breach, PA-C 10/15/14 0105  Lacretia Leigh, MD 10/16/14 [phone]

## 2014-10-20 ENCOUNTER — Encounter: Payer: Self-pay | Admitting: Obstetrics & Gynecology

## 2014-10-20 ENCOUNTER — Telehealth: Payer: Self-pay | Admitting: *Deleted

## 2014-10-20 ENCOUNTER — Other Ambulatory Visit (HOSPITAL_COMMUNITY)
Admission: RE | Admit: 2014-10-20 | Discharge: 2014-10-20 | Disposition: A | Discharge status: Home / Self Care | Payer: Medicaid Other | Source: Ambulatory Visit | Attending: Obstetrics & Gynecology | Admitting: Obstetrics & Gynecology

## 2014-10-20 ENCOUNTER — Ambulatory Visit (INDEPENDENT_AMBULATORY_CARE_PROVIDER_SITE_OTHER): Payer: Medicaid Other | Admitting: Obstetrics & Gynecology

## 2014-10-20 VITALS — BP 109/73 | HR 68 | Temp 98.5°F | Ht 66.0 in | Wt 171.2 lb

## 2014-10-20 DIAGNOSIS — D251 Intramural leiomyoma of uterus: Secondary | ICD-10-CM | POA: Diagnosis present

## 2014-10-20 LAB — POCT PREGNANCY, URINE: PREG TEST UR: NEGATIVE

## 2014-10-20 MED ORDER — MEDROXYPROGESTERONE ACETATE 10 MG PO TABS: 20.0000 mg | ORAL_TABLET | Freq: Every day | ORAL | Status: DC

## 2014-10-20 MED ORDER — LEUPROLIDE ACETATE (3 MONTH) 11.25 MG IM KIT
11.2500 mg | PACK | Freq: Once | INTRAMUSCULAR | Status: AC
  Administered 2014-10-20: 11.25 mg via INTRAMUSCULAR

## 2014-10-20 MED ORDER — LEUPROLIDE ACETATE (3 MONTH) 11.25 MG IM KIT: 11.2500 mg | PACK | Freq: Once | INTRAMUSCULAR | Status: DC

## 2014-10-20 NOTE — Telephone Encounter (Signed)
Pt contacted clinic with question concerning referral.  Pt has appointment today, complains of bleeding with odor. Pt verbalizes understanding.

## 2014-10-20 NOTE — Progress Notes (Signed)
Patient ID: Maria Logan, female   DOB: [birth year], 49 y.o.   MRN: [id number]  Chief Complaint  Patient presents with  . Vaginal Bleeding  DUB 2 weeks, h/o fibroid  HPI Maria Logan is a 49 y.o. female.  [id number] Patient's last menstrual period was 10/06/2014. Still bleeding, has known fibroid uterus and was referred by Dr Ruthann Cancer to have [place] 05/2014 and was pregnant, miscarried. Has come to [place] since then. Now would like a hysterectomy. HPI  Past Medical History  Diagnosis Date  . Fibroids     Past Surgical History  Procedure Laterality Date  . Examination under anesthesia N/A 06/04/2014    Procedure: EXAM UNDER ANESTHESIA;  Surgeon: Guss Bunde, MD;  Location: [facility];  Service: Gynecology;  Laterality: N/A;  . Operative ultrasound N/A 06/04/2014    Procedure: OPERATIVE ULTRASOUND;  Surgeon: Guss Bunde, MD;  Location: [facility];  Service: Gynecology;  Laterality: N/A;    Family History  Problem Relation Age of Onset  . Alcohol abuse Neg Hx   . Arthritis Neg Hx   . Asthma Neg Hx   . Birth defects Neg Hx   . Cancer Neg Hx   . COPD Neg Hx   . Depression Neg Hx   . Diabetes Neg Hx   . Drug abuse Neg Hx   . Early death Neg Hx   . Hearing loss Neg Hx   . Heart disease Neg Hx   . Hyperlipidemia Neg Hx   . Hypertension Neg Hx   . Kidney disease Neg Hx   . Learning disabilities Neg Hx   . Mental illness Neg Hx   . Mental retardation Neg Hx   . Miscarriages / Stillbirths Neg Hx   . Stroke Neg Hx   . Vision loss Neg Hx   . Varicose Veins Neg Hx     Social History Social History  Substance Use Topics  . Smoking status: Never Smoker   . Smokeless tobacco: Never Used  . Alcohol Use: No    No Known Allergies  Current Outpatient Prescriptions  Medication Sig Dispense Refill  . diclofenac sodium (VOLTAREN) 1 % GEL Apply 4 g topically 4 (four) times daily. 100 g 1  . medroxyPROGESTERone (PROVERA) 10 MG tablet Take 2 tablets (20 mg total) by mouth daily. 30 tablet 2    Current Facility-Administered Medications  Medication Dose Route Frequency Provider Last Rate Last Dose  . leuprolide (LUPRON) injection 11.25 mg  11.25 mg Intramuscular Once Woodroe Mode, MD      . medroxyPROGESTERone (DEPO-PROVERA) injection 150 mg  150 mg Intramuscular Q90 days Osborne Oman, MD   150 mg at 08/27/14 1343    Review of Systems Review of Systems  Constitutional: Negative.   Respiratory: Negative.   Gastrointestinal: Negative.   Genitourinary: Positive for vaginal bleeding, vaginal discharge, menstrual problem and pelvic pain.    Blood pressure 109/73, pulse 68, temperature 98.5 F (36.9 C), height 5\' 6"  (1.676 m), weight 171 lb 3.2 oz (77.656 kg), last menstrual period 10/06/2014.  Physical Exam Physical Exam  Constitutional: She is oriented to person, place, and time. She appears well-developed. No distress.  Pulmonary/Chest: Effort normal.  Abdominal: Soft. She exhibits mass (to umbilicus).  Genitourinary: Vagina normal.  Dark blood, cx nl, uterus 18 week size  Neurological: She is alert and oriented to person, place, and time.  Skin: Skin is warm and dry.  Psychiatric: She has a normal mood and affect. Her behavior is  normal.  Patient given informed consent, signed copy in the chart, time out was performed. Appropriate time out taken. . The patient was placed in the lithotomy position and the cervix brought into view with sterile speculum.  Portio of cervix cleansed x 2 with betadine swabs.  A tenaculum was placed in the anterior lip of the cervix.  The uterus was sounded for depth of 6. A pipelle was introduced to into the uterus, suction created,  and an endometrial sample was obtained, mostly blood. All equipment was removed and accounted for.  The patient tolerated the procedure well.    Patient given post procedure instructions. Data Reviewed CBC    Component Value Date/Time   WBC 6.9 07/16/2014 1544   RBC 3.63* 07/16/2014 1544   HGB 10.5*  07/16/2014 1544   HCT 32.2* 07/16/2014 1544   PLT 299 07/16/2014 1544   MCV 88.7 07/16/2014 1544   MCH 28.9 07/16/2014 1544   MCHC 32.6 07/16/2014 1544   RDW 12.6 07/16/2014 1544   LYMPHSABS 1.0 05/28/2014 0800   MONOABS 1.2* 05/28/2014 0800   EOSABS 0.0 05/28/2014 0800   BASOSABS 0.0 05/28/2014 0800    CLINICAL DATA: Acute left pelvic pain for 1 week  EXAM: TRANSABDOMINAL AND TRANSVAGINAL ULTRASOUND OF PELVIS  TECHNIQUE: Both transabdominal and transvaginal ultrasound examinations of the pelvis were performed. Transabdominal technique was performed for global imaging of the pelvis including uterus, ovaries, adnexal regions, and pelvic cul-de-sac. It was necessary to proceed with endovaginal exam following the transabdominal exam to visualize the uterus and adnexae in better detail.  COMPARISON: 07/08/2009, 05/25/2010  FINDINGS: Uterus  Measurements: 14 x 9.3 x 9.9 cm. Diffuse heterogeneous enlargement. Two heterogeneous dominant fibroids noted. 1 in the fundus measures 9.5 x 9.4 x 0.5 cm. This is the largest fibroid. Second measurable uterine fibroid is right fundal measuring 4.7 x 4.4 x 4.4 cm. These appear larger than on prior studies. Cervical nabothian cysts evident.  Endometrium  Thickness: Not visualized. Obscured by the fibroids.  Right ovary  Measurements: 1.4 x 0.9 x 1.1 cm. Normal appearance/no adnexal mass.  Left ovary  Measurements: 3.9 x 1.5 x 3.3 cm. Grossly normal appearance. Only seen transabdominally.  Other findings  No free fluid.  IMPRESSION: Enlarged fibroid uterus. Uterine fibroids appear larger than the 2 comparison studies.  No other significant acute process by ultrasound.   Electronically Signed  By: Daryll Brod M.D.  On: 12/14/2013 12:16        Assessment    Patient Active Problem List   Diagnosis Date Noted  . Complete miscarriage 08/27/2014  . Inappropriate change in quantitative hCG in  early pregnancy 07/28/2014  . Intramural leiomyoma of uterus   Sx fibroids, large DUB, menorrhagia      Plan   The patient will return in 2 weeks for results. Offered TAH/BS for fibroids. The procedure and the risk of anesthesia, bleeding, infection, bowel and bladder injury,were discussed and her questions were answered. The procedure will be scheduled.  Lupron 11.25 mg IM Provera 20 mg /day          Shayli Altemose 10/20/2014, 4:48 PM

## 2014-10-20 NOTE — Patient Instructions (Signed)
Uterine Fibroids Uterine fibroids are tissue masses (tumors) that can develop in the womb (uterus). They are also called leiomyomas. This type of tumor is not cancerous (benign) and does not spread to other parts of the body outside of the pelvic area, which is between the hip bones. Occasionally, fibroids may develop in the fallopian tubes, in the cervix, or on the support structures (ligaments) that surround the uterus. You can have one or many fibroids. Fibroids can vary in size, weight, and where they grow in the uterus. Some can become quite large. Most fibroids do not require medical treatment. CAUSES A fibroid can develop when a single uterine cell keeps growing (replicating). Most cells in the human body have a control mechanism that keeps them from replicating without control. SIGNS AND SYMPTOMS Symptoms may include:   Heavy bleeding during your period. Bleeding or spotting between periods.Abdominal Hysterectomy Abdominal hysterectomy is a surgical procedure to remove your womb (uterus). Your uterus is the muscular organ that contains a developing baby. This surgery is done for many reasons. You may need an abdominal hysterectomy if you have cancer, growths (tumors), long-term pain, or bleeding. You may also have this procedure if your uterus has slipped down into your vagina (uterine prolapse). Depending on why you need an abdominal hysterectomy, you may also have other reproductive organs removed. These could include the part of your vagina that connects with your uterus (cervix), the organs that make eggs (ovaries), and the tubes that connect the ovaries to the uterus (fallopian tubes). LET [facility] CARE PROVIDER KNOW ABOUT:  Any allergies you have. All medicines you are taking, including vitamins, herbs, eye drops, creams, and over-the-counter medicines. Previous problems you or members of your family have had with the use of anesthetics. Any blood disorders you have. Previous  surgeries you have had. Medical conditions you have. RISKS AND COMPLICATIONS Generally, this is a safe procedure. However, as with any procedure, problems can occur. Infection is the most common problem after an abdominal hysterectomy. Other possible problems include: Bleeding. Formation of blood clots that may break free and travel to your lungs. Injury to other organs near your uterus. Nerve injury causing nerve pain. Decreased interest in sex or pain during sexual intercourse. BEFORE THE PROCEDURE Abdominal hysterectomy is a major surgical procedure. It can affect the way you feel about yourself. Talk to your health care provider about the physical and emotional changes hysterectomy may cause. You may need to have blood work and X-rays done before surgery. Quit smoking if you smoke. Ask your health care provider for help if you are struggling to quit. Stop taking medicines that thin your blood as directed by your health care provider. You may be instructed to take antibiotic medicines or laxatives before surgery. Do not eat or drink anything for 6-8 hours before surgery. Take your regular medicines with a small sip of water. Bathe or shower the night or morning before surgery. PROCEDURE Abdominal hysterectomy is done in the operating room at the hospital. In most cases, you will be given a medicine that makes you go to sleep (general anesthetic). The surgeon will make a cut (incision) through the skin in your lower belly. The incision may be about 5-7 inches long. It may go side-to-side or up-and-down. The surgeon will move aside the body tissue that covers your uterus. The surgeon will then carefully take out your uterus along with any of your other reproductive organs that need to be removed. Bleeding will be controlled with clamps  or sutures. The surgeon will close your incision with sutures or metal clips. AFTER THE PROCEDURE You will have some pain immediately after the  procedure. You will be given pain medicine in the recovery room. You will be taken to your hospital room when you have recovered from the anesthesia. You may need to stay in the hospital for 2-5 days. You will be given instructions for recovery at home.   This information is not intended to replace advice given to you by your health care provider. Make sure you discuss any questions you have with your health care provider.   Document Released: 01/01/2013 Document Reviewed: 01/01/2013 Elsevier Interactive Patient Education [employer].    Bladder problems, such as needing to urinate more often (urinary frequency) or urgently.  Inability to reproduce offspring (infertility).  Miscarriages. DIAGNOSIS Uterine fibroids are diagnosed through a physical exam. Your health care provider may feel the lumpy tumors during a pelvic exam. Ultrasonography and an MRI may be done to determine the size, location, and number of fibroids. TREATMENT Treatment may include:  Watchful waiting. This involves getting the fibroid checked by your health care provider to see if it grows or shrinks. Follow your health care provider's recommendations for how often to have this checked.  Hormone medicines. These can be taken by mouth or given through an intrauterine device (IUD).  Surgery.  Removing the fibroids (myomectomy) or the uterus (hysterectomy).  Removing blood supply to the fibroids (uterine artery embolization). If fibroids interfere with your fertility and you want to become pregnant, your health care provider may recommend having the fibroids removed.  HOME CARE INSTRUCTIONS  Keep all follow-up visits as directed by your health care provider. This is important.  Take medicines only as directed by your health care provider.  If you were prescribed a hormone treatment, take the hormone medicines exactly as directed.  Do not take aspirin, because it can cause bleeding.  Ask your health  care provider about taking iron pills and increasing the amount of dark green, leafy vegetables in your diet. These actions can help to boost your blood iron levels, which may be affected by heavy menstrual bleeding.  Pay close attention to your period and tell your health care provider about any changes, such as:  Increased blood flow that requires you to use more pads or tampons than usual per month.  A change in the number of days that your period lasts per month.  A change in symptoms that are associated with your period, such as abdominal cramping or back pain. SEEK MEDICAL CARE IF:  You have pelvic pain, back pain, or abdominal cramps that cannot be controlled with medicines.  You have an increase in bleeding between and during periods.  You soak tampons or pads in a half hour or less.  You feel lightheaded, extra tired, or weak. SEEK IMMEDIATE MEDICAL CARE IF:  You faint.  You have a sudden increase in pelvic pain.   This information is not intended to replace advice given to you by your health care provider. Make sure you discuss any questions you have with your health care provider.   Document Released: 12/25/1999 Document Revised: 01/17/2014 Document Reviewed: 06/25/2013 Elsevier Interactive Patient Education [employer].

## 2014-10-20 NOTE — Progress Notes (Signed)
Patient had a miscarriage in May. States she bled for a while after that. States had a period that lasted 2 weeks- which is longer than her usual. States for the first week bright red bleeding, second week has been dark brown with small clots si

## 2014-10-22 ENCOUNTER — Encounter: Payer: Self-pay | Admitting: Advanced Practice Midwife

## 2014-10-22 ENCOUNTER — Other Ambulatory Visit: Payer: Medicaid Other

## 2014-10-22 ENCOUNTER — Ambulatory Visit: Payer: Medicaid Other | Admitting: Obstetrics & Gynecology

## 2014-10-22 DIAGNOSIS — N938 Other specified abnormal uterine and vaginal bleeding: Secondary | ICD-10-CM

## 2014-10-22 LAB — CBC
HCT: 39.4 % (ref 36.0–46.0)
Hemoglobin: 12.8 g/dL (ref 12.0–15.0)
MCH: 28.5 pg (ref 26.0–34.0)
MCHC: 32.5 g/dL (ref 30.0–36.0)
MCV: 87.8 fL (ref 78.0–100.0)
MPV: 10.9 fL (ref 8.6–12.4)
Platelets: 240 10*3/uL (ref 150–400)
RBC: 4.49 MIL/uL (ref 3.87–5.11)
RDW: 13.3 % (ref 11.5–15.5)
WBC: 5.7 10*3/uL (ref 4.0–10.5)

## 2014-10-31 ENCOUNTER — Telehealth: Payer: Self-pay | Admitting: *Deleted

## 2014-10-31 NOTE — Telephone Encounter (Signed)
Called Maria Logan and notified her biopsy was negative. She also wanted to have a referral for her knee- I explained to her since she has [place] access we can not make that referral- she will need to go thru her pcp on her card . She states she does not go there anymore- instructed her to call her medicaid caseworker to get that changed and go thru new assigned PCP.  She also wanted to make sure surgery was not until December- Transferred her to OB/GYN office to discuss that.

## 2014-11-05 ENCOUNTER — Encounter (HOSPITAL_COMMUNITY): Payer: Self-pay | Admitting: *Deleted

## 2014-11-06 ENCOUNTER — Other Ambulatory Visit: Payer: Self-pay | Admitting: Obstetrics & Gynecology

## 2014-11-12 ENCOUNTER — Encounter: Payer: Self-pay | Admitting: General Practice

## 2014-11-12 ENCOUNTER — Ambulatory Visit: Payer: Medicaid Other | Admitting: *Deleted

## 2014-11-17 ENCOUNTER — Ambulatory Visit (INDEPENDENT_AMBULATORY_CARE_PROVIDER_SITE_OTHER): Payer: Medicaid Other | Admitting: Obstetrics & Gynecology

## 2014-11-17 ENCOUNTER — Encounter: Payer: Self-pay | Admitting: Obstetrics & Gynecology

## 2014-11-17 VITALS — BP 90/68 | HR 79 | Temp 98.3°F | Ht 66.0 in | Wt 174.2 lb

## 2014-11-17 DIAGNOSIS — D251 Intramural leiomyoma of uterus: Secondary | ICD-10-CM | POA: Diagnosis present

## 2014-11-17 NOTE — Progress Notes (Signed)
Subjective:     Patient ID: Maria Logan, female   DOB: 06-Oct-1965, 49 y.o.   MRN: [id number]  [id number] Patient's last menstrual period was 11/05/2014. Pt now scheduled for TAH on 12/13. NO bleeding now or pain, s/p lupron depot.     Review of Systems  Constitutional: Negative.   Genitourinary: Negative for vaginal bleeding, vaginal discharge and pelvic pain.       Objective:   Physical Exam  Constitutional: She is oriented to person, place, and time. She appears well-developed. No distress.  Neurological: She is alert and oriented to person, place, and time.  Psychiatric: She has a normal mood and affect. Her behavior is normal.   CBC    Component Value Date/Time   WBC 5.7 10/22/2014 0758   RBC 4.49 10/22/2014 0758   HGB 12.8 10/22/2014 0758   HCT 39.4 10/22/2014 0758   PLT 240 10/22/2014 0758   MCV 87.8 10/22/2014 0758   MCH 28.5 10/22/2014 0758   MCHC 32.5 10/22/2014 0758   RDW 13.3 10/22/2014 0758   LYMPHSABS 1.0 05/28/2014 0800   MONOABS 1.2* 05/28/2014 0800   EOSABS 0.0 05/28/2014 0800   BASOSABS 0.0 05/28/2014 0800        Assessment:     Sx fibroid uterus  Benign endometrial biopsy    Plan:     TAH/BS scheduled in 5 weeks Continue Provera  Woodroe Mode, MD 11/17/2014

## 2014-11-17 NOTE — Patient Instructions (Signed)
Abdominal Hysterectomy Abdominal hysterectomy is a surgical procedure to remove your womb (uterus). Your uterus is the muscular organ that contains a developing baby. This surgery is done for many reasons. You may need an abdominal hysterectomy if you have cancer, growths (tumors), long-term pain, or bleeding. You may also have this procedure if your uterus has slipped down into your vagina (uterine prolapse). Depending on why you need an abdominal hysterectomy, you may also have other reproductive organs removed. These could include the part of your vagina that connects with your uterus (cervix), the organs that make eggs (ovaries), and the tubes that connect the ovaries to the uterus (fallopian tubes). LET [facility] CARE PROVIDER KNOW ABOUT:   Any allergies you have.  All medicines you are taking, including vitamins, herbs, eye drops, creams, and over-the-counter medicines.  Previous problems you or members of your family have had with the use of anesthetics.  Any blood disorders you have.  Previous surgeries you have had.  Medical conditions you have. RISKS AND COMPLICATIONS Generally, this is a safe procedure. However, as with any procedure, problems can occur. Infection is the most common problem after an abdominal hysterectomy. Other possible problems include:  Bleeding.  Formation of blood clots that may break free and travel to your lungs.  Injury to other organs near your uterus.  Nerve injury causing nerve pain.  Decreased interest in sex or pain during sexual intercourse. BEFORE THE PROCEDURE  Abdominal hysterectomy is a major surgical procedure. It can affect the way you feel about yourself. Talk to your health care provider about the physical and emotional changes hysterectomy may cause.  You may need to have blood work and X-rays done before surgery.  Quit smoking if you smoke. Ask your health care provider for help if you are struggling to quit.  Stop taking  medicines that thin your blood as directed by your health care provider.  You may be instructed to take antibiotic medicines or laxatives before surgery.  Do not eat or drink anything for 6-8 hours before surgery.  Take your regular medicines with a small sip of water.  Bathe or shower the night or morning before surgery. PROCEDURE  Abdominal hysterectomy is done in the operating room at the hospital.  In most cases, you will be given a medicine that makes you go to sleep (general anesthetic).  The surgeon will make a cut (incision) through the skin in your lower belly.  The incision may be about 5-7 inches long. It may go side-to-side or up-and-down.  The surgeon will move aside the body tissue that covers your uterus. The surgeon will then carefully take out your uterus along with any of your other reproductive organs that need to be removed.  Bleeding will be controlled with clamps or sutures.  The surgeon will close your incision with sutures or metal clips. AFTER THE PROCEDURE  You will have some pain immediately after the procedure.  You will be given pain medicine in the recovery room.  You will be taken to your hospital room when you have recovered from the anesthesia.  You may need to stay in the hospital for 2-5 days.  You will be given instructions for recovery at home.   This information is not intended to replace advice given to you by your health care provider. Make sure you discuss any questions you have with your health care provider.   Document Released: 01/01/2013 Document Reviewed: 01/01/2013 Elsevier Interactive Patient Education [employer].

## 2014-11-20 NOTE — Progress Notes (Signed)
Patient not seen today. No nurse visit needed. appt rescheduled with MD.

## 2014-12-11 ENCOUNTER — Encounter (HOSPITAL_COMMUNITY): Payer: Self-pay

## 2014-12-11 ENCOUNTER — Encounter (HOSPITAL_COMMUNITY)
Admission: RE | Admit: 2014-12-11 | Discharge: 2014-12-11 | Disposition: A | Discharge status: Home / Self Care | Payer: Medicaid Other | Source: Ambulatory Visit | Attending: Obstetrics & Gynecology | Admitting: Obstetrics & Gynecology

## 2014-12-11 DIAGNOSIS — Z01818 Encounter for other preprocedural examination: Secondary | ICD-10-CM | POA: Diagnosis present

## 2014-12-11 DIAGNOSIS — D259 Leiomyoma of uterus, unspecified: Secondary | ICD-10-CM | POA: Insufficient documentation

## 2014-12-11 LAB — CBC
HCT: 38.6 % (ref 36.0–46.0)
Hemoglobin: 12.7 g/dL (ref 12.0–15.0)
MCH: 29 pg (ref 26.0–34.0)
MCHC: 32.9 g/dL (ref 30.0–36.0)
MCV: 88.1 fL (ref 78.0–100.0)
PLATELETS: 223 10*3/uL (ref 150–400)
RBC: 4.38 MIL/uL (ref 3.87–5.11)
RDW: 12.9 % (ref 11.5–15.5)
WBC: 6.4 10*3/uL (ref 4.0–10.5)

## 2014-12-11 LAB — TYPE AND SCREEN
ABO/RH(D): B POS
Antibody Screen: NEGATIVE

## 2014-12-11 NOTE — Patient Instructions (Signed)
Your procedure is scheduled on: December 23, 2014    Enter through the Main Entrance of [facility] at: 10:30 am   Pick up the phone at the desk and dial [phone].  Call this number if you have problems the morning of surgery: [phone].  Remember: Do NOT eat food: after midnight on Monday the 12th Do NOT drink clear liquids after: 8:00 am  Take these medicines the morning of surgery with a SIP OF WATER: none   Do NOT wear jewelry (body piercing), metal hair clips/bobby pins, make-up, or nail polish. Do NOT wear lotions, powders, or perfumes.  You may wear deoderant. Do NOT shave for 48 hours prior to surgery. Do NOT bring valuables to the hospital. Contacts, dentures, or bridgework may not be worn into surgery. Leave suitcase in car.  After surgery it may be brought to your room.  For patients admitted to the hospital, checkout time is 11:00 AM the day of discharge.

## 2014-12-23 ENCOUNTER — Inpatient Hospital Stay (HOSPITAL_COMMUNITY): Payer: Medicaid Other | Admitting: Anesthesiology

## 2014-12-23 ENCOUNTER — Encounter (HOSPITAL_COMMUNITY)
Admission: RE | Disposition: A | Discharge status: Home / Self Care | Payer: Self-pay | Source: Ambulatory Visit | Attending: Obstetrics and Gynecology

## 2014-12-23 ENCOUNTER — Inpatient Hospital Stay (HOSPITAL_COMMUNITY)
Admission: RE | Admit: 2014-12-23 | Discharge: 2014-12-25 | DRG: 743 | Disposition: A | Discharge status: Home / Self Care | Payer: Medicaid Other | Source: Ambulatory Visit | Attending: Obstetrics & Gynecology | Admitting: Obstetrics & Gynecology

## 2014-12-23 ENCOUNTER — Encounter (HOSPITAL_COMMUNITY): Payer: Self-pay | Admitting: *Deleted

## 2014-12-23 ENCOUNTER — Encounter (HOSPITAL_COMMUNITY): Payer: Self-pay | Admitting: Obstetrics & Gynecology

## 2014-12-23 DIAGNOSIS — D259 Leiomyoma of uterus, unspecified: Secondary | ICD-10-CM | POA: Diagnosis present

## 2014-12-23 DIAGNOSIS — N920 Excessive and frequent menstruation with regular cycle: Secondary | ICD-10-CM | POA: Diagnosis present

## 2014-12-23 DIAGNOSIS — N938 Other specified abnormal uterine and vaginal bleeding: Secondary | ICD-10-CM | POA: Diagnosis present

## 2014-12-23 DIAGNOSIS — Z90710 Acquired absence of both cervix and uterus: Secondary | ICD-10-CM | POA: Diagnosis present

## 2014-12-23 DIAGNOSIS — Z23 Encounter for immunization: Secondary | ICD-10-CM

## 2014-12-23 HISTORY — PX: BILATERAL SALPINGECTOMY: SHX5743

## 2014-12-23 HISTORY — PX: ABDOMINAL HYSTERECTOMY: SHX81

## 2014-12-23 LAB — PREGNANCY, URINE: PREG TEST UR: NEGATIVE

## 2014-12-23 SURGERY — HYSTERECTOMY, ABDOMINAL
Anesthesia: Monitor Anesthesia Care | Site: Abdomen

## 2014-12-23 MED ORDER — CEFAZOLIN SODIUM-DEXTROSE 2-3 GM-% IV SOLR
INTRAVENOUS | Status: AC
Start: 2014-12-23 — End: 2014-12-23
  Filled 2014-12-23: qty 50

## 2014-12-23 MED ORDER — FENTANYL CITRATE (PF) 250 MCG/5ML IJ SOLN
INTRAMUSCULAR | Status: AC
Start: 2014-12-23 — End: 2014-12-23
  Filled 2014-12-23: qty 5

## 2014-12-23 MED ORDER — HYDROMORPHONE HCL 1 MG/ML IJ SOLN
INTRAMUSCULAR | Status: AC
  Filled 2014-12-23: qty 1

## 2014-12-23 MED ORDER — MIDAZOLAM HCL 2 MG/2ML IJ SOLN
INTRAMUSCULAR | Status: DC | PRN
  Administered 2014-12-23: 2 mg via INTRAVENOUS

## 2014-12-23 MED ORDER — FENTANYL CITRATE (PF) 100 MCG/2ML IJ SOLN
INTRAMUSCULAR | Status: DC | PRN
  Administered 2014-12-23: 100 ug via INTRAVENOUS
  Administered 2014-12-23 (×6): 50 ug via INTRAVENOUS

## 2014-12-23 MED ORDER — NEOSTIGMINE METHYLSULFATE 10 MG/10ML IV SOLN
INTRAVENOUS | Status: DC | PRN
  Administered 2014-12-23: 3 mg via INTRAVENOUS

## 2014-12-23 MED ORDER — FENTANYL CITRATE (PF) 250 MCG/5ML IJ SOLN
INTRAMUSCULAR | Status: AC
  Filled 2014-12-23: qty 5

## 2014-12-23 MED ORDER — NEOSTIGMINE METHYLSULFATE 10 MG/10ML IV SOLN
INTRAVENOUS | Status: AC
  Filled 2014-12-23: qty 1

## 2014-12-23 MED ORDER — LIDOCAINE HCL (CARDIAC) 20 MG/ML IV SOLN
INTRAVENOUS | Status: AC
  Filled 2014-12-23: qty 5

## 2014-12-23 MED ORDER — ROCURONIUM BROMIDE 100 MG/10ML IV SOLN
INTRAVENOUS | Status: DC | PRN
  Administered 2014-12-23 (×2): 10 mg via INTRAVENOUS
  Administered 2014-12-23: 30 mg via INTRAVENOUS
  Administered 2014-12-23: 10 mg via INTRAVENOUS

## 2014-12-23 MED ORDER — DEXAMETHASONE SODIUM PHOSPHATE 10 MG/ML IJ SOLN
INTRAMUSCULAR | Status: AC
  Filled 2014-12-23: qty 1

## 2014-12-23 MED ORDER — LACTATED RINGERS IV SOLN
INTRAVENOUS | Status: DC
  Administered 2014-12-23 – 2014-12-24 (×2): via INTRAVENOUS

## 2014-12-23 MED ORDER — LIDOCAINE HCL (CARDIAC) 20 MG/ML IV SOLN
INTRAVENOUS | Status: DC | PRN
  Administered 2014-12-23: 80 mg via INTRAVENOUS

## 2014-12-23 MED ORDER — GLYCOPYRROLATE 0.2 MG/ML IJ SOLN
INTRAMUSCULAR | Status: AC
  Filled 2014-12-23: qty 3

## 2014-12-23 MED ORDER — ONDANSETRON HCL 4 MG PO TABS
4.0000 mg | ORAL_TABLET | Freq: Four times a day (QID) | ORAL | Status: DC | PRN
Start: 2014-12-23 — End: 2014-12-25

## 2014-12-23 MED ORDER — KETOROLAC TROMETHAMINE 30 MG/ML IJ SOLN
INTRAMUSCULAR | Status: AC
  Filled 2014-12-23: qty 1

## 2014-12-23 MED ORDER — BUPIVACAINE HCL (PF) 0.5 % IJ SOLN
INTRAMUSCULAR | Status: AC
  Filled 2014-12-23: qty 30

## 2014-12-23 MED ORDER — DIPHENHYDRAMINE HCL 12.5 MG/5ML PO ELIX: 12.5000 mg | ORAL_SOLUTION | Freq: Four times a day (QID) | ORAL | Status: DC | PRN

## 2014-12-23 MED ORDER — ROCURONIUM BROMIDE 100 MG/10ML IV SOLN
INTRAVENOUS | Status: AC
  Filled 2014-12-23: qty 1

## 2014-12-23 MED ORDER — OXYCODONE-ACETAMINOPHEN 5-325 MG PO TABS
1.0000 | ORAL_TABLET | ORAL | Status: DC | PRN
  Administered 2014-12-24 (×4): 2 via ORAL
  Administered 2014-12-25: 1 via ORAL
  Administered 2014-12-25: 2 via ORAL
  Filled 2014-12-23: qty 2
  Filled 2014-12-23: qty 1
  Filled 2014-12-23 (×4): qty 2

## 2014-12-23 MED ORDER — DEXAMETHASONE SODIUM PHOSPHATE 10 MG/ML IJ SOLN
INTRAMUSCULAR | Status: DC | PRN
  Administered 2014-12-23: 8 mg via INTRAVENOUS

## 2014-12-23 MED ORDER — LACTATED RINGERS IV SOLN: INTRAVENOUS | Status: DC

## 2014-12-23 MED ORDER — METOCLOPRAMIDE HCL 5 MG/ML IJ SOLN: 10.0000 mg | Freq: Once | INTRAMUSCULAR | Status: DC | PRN

## 2014-12-23 MED ORDER — SCOPOLAMINE 1 MG/3DAYS TD PT72
1.0000 | MEDICATED_PATCH | Freq: Once | TRANSDERMAL | Status: DC
  Administered 2014-12-23: 1.5 mg via TRANSDERMAL

## 2014-12-23 MED ORDER — ONDANSETRON HCL 4 MG/2ML IJ SOLN
INTRAMUSCULAR | Status: DC | PRN
  Administered 2014-12-23: 4 mg via INTRAVENOUS

## 2014-12-23 MED ORDER — DIPHENHYDRAMINE HCL 50 MG/ML IJ SOLN
12.5000 mg | Freq: Four times a day (QID) | INTRAMUSCULAR | Status: DC | PRN
  Administered 2014-12-24: 12.5 mg via INTRAVENOUS
  Filled 2014-12-23: qty 1

## 2014-12-23 MED ORDER — HYDROMORPHONE 1 MG/ML IV SOLN
INTRAVENOUS | Status: DC
  Administered 2014-12-23 (×2): 0.6 mg via INTRAVENOUS
  Administered 2014-12-23: 16:00:00 via INTRAVENOUS
  Administered 2014-12-24: 0.6 mg via INTRAVENOUS
  Administered 2014-12-24: 3.8 mL via INTRAVENOUS
  Administered 2014-12-24: 0.4 mg via INTRAVENOUS
  Filled 2014-12-23: qty 25

## 2014-12-23 MED ORDER — ONDANSETRON HCL 4 MG/2ML IJ SOLN: 4.0000 mg | Freq: Four times a day (QID) | INTRAMUSCULAR | Status: DC | PRN

## 2014-12-23 MED ORDER — SCOPOLAMINE 1 MG/3DAYS TD PT72
MEDICATED_PATCH | TRANSDERMAL | Status: AC
  Administered 2014-12-23: 1.5 mg via TRANSDERMAL
  Filled 2014-12-23: qty 1

## 2014-12-23 MED ORDER — MEPERIDINE HCL 25 MG/ML IJ SOLN: 6.2500 mg | INTRAMUSCULAR | Status: DC | PRN

## 2014-12-23 MED ORDER — LACTATED RINGERS IV SOLN
INTRAVENOUS | Status: DC
  Administered 2014-12-23 (×3): via INTRAVENOUS

## 2014-12-23 MED ORDER — MIDAZOLAM HCL 2 MG/2ML IJ SOLN
INTRAMUSCULAR | Status: AC
  Filled 2014-12-23: qty 2

## 2014-12-23 MED ORDER — GLYCOPYRROLATE 0.2 MG/ML IJ SOLN
INTRAMUSCULAR | Status: DC | PRN
  Administered 2014-12-23: 0.6 mg via INTRAVENOUS

## 2014-12-23 MED ORDER — SODIUM CHLORIDE 0.9 % IJ SOLN: 9.0000 mL | INTRAMUSCULAR | Status: DC | PRN

## 2014-12-23 MED ORDER — CEFAZOLIN SODIUM-DEXTROSE 2-3 GM-% IV SOLR
2.0000 g | INTRAVENOUS | Status: AC
  Administered 2014-12-23: 2 g via INTRAVENOUS

## 2014-12-23 MED ORDER — HYDROMORPHONE HCL 1 MG/ML IJ SOLN
0.2500 mg | INTRAMUSCULAR | Status: DC | PRN
  Administered 2014-12-23 (×4): 0.5 mg via INTRAVENOUS

## 2014-12-23 MED ORDER — PROPOFOL 10 MG/ML IV BOLUS
INTRAVENOUS | Status: DC | PRN
  Administered 2014-12-23: 150 mg via INTRAVENOUS

## 2014-12-23 MED ORDER — BUPIVACAINE HCL (PF) 0.5 % IJ SOLN
INTRAMUSCULAR | Status: DC | PRN
  Administered 2014-12-23: 20 mL

## 2014-12-23 MED ORDER — INFLUENZA VAC SPLIT QUAD 0.5 ML IM SUSY
0.5000 mL | PREFILLED_SYRINGE | INTRAMUSCULAR | Status: AC
  Administered 2014-12-25: 0.5 mL via INTRAMUSCULAR
  Filled 2014-12-23: qty 0.5

## 2014-12-23 MED ORDER — HYDROMORPHONE HCL 1 MG/ML IJ SOLN
INTRAMUSCULAR | Status: DC | PRN
  Administered 2014-12-23: 1 mg via INTRAVENOUS

## 2014-12-23 MED ORDER — NALOXONE HCL 0.4 MG/ML IJ SOLN: 0.4000 mg | INTRAMUSCULAR | Status: DC | PRN

## 2014-12-23 MED ORDER — PROPOFOL 10 MG/ML IV BOLUS
INTRAVENOUS | Status: AC
  Filled 2014-12-23: qty 20

## 2014-12-23 MED ORDER — ONDANSETRON HCL 4 MG/2ML IJ SOLN
INTRAMUSCULAR | Status: AC
  Filled 2014-12-23: qty 2

## 2014-12-23 SURGICAL SUPPLY — 31 items
CANISTER SUCT 3000ML (MISCELLANEOUS) ×3 IMPLANT
CLOTH BEACON ORANGE TIMEOUT ST (SAFETY) ×3 IMPLANT
CONT PATH 16OZ SNAP LID 3702 (MISCELLANEOUS) ×3 IMPLANT
DRAPE WARM FLUID 44X44 (DRAPE) ×1 IMPLANT
DRSG OPSITE POSTOP 4X10 (GAUZE/BANDAGES/DRESSINGS) ×3 IMPLANT
DURAPREP 26ML APPLICATOR (WOUND CARE) ×3 IMPLANT
GAUZE SPONGE 4X4 16PLY XRAY LF (GAUZE/BANDAGES/DRESSINGS) IMPLANT
GLOVE BIO SURGEON STRL SZ 6.5 (GLOVE) ×3 IMPLANT
GLOVE BIOGEL PI IND STRL 7.0 (GLOVE) ×8 IMPLANT
GLOVE BIOGEL PI INDICATOR 7.0 (GLOVE) ×4
GOWN STRL REUS W/TWL LRG LVL3 (GOWN DISPOSABLE) ×9 IMPLANT
NEEDLE HYPO 22GX1.5 SAFETY (NEEDLE) ×3 IMPLANT
NS IRRIG 1000ML POUR BTL (IV SOLUTION) ×3 IMPLANT
PACK ABDOMINAL GYN (CUSTOM PROCEDURE TRAY) ×3 IMPLANT
PAD OB MATERNITY 4.3X12.25 (PERSONAL CARE ITEMS) ×3 IMPLANT
PENCIL SMOKE EVAC W/HOLSTER (ELECTROSURGICAL) ×3 IMPLANT
SPONGE LAP 18X18 X RAY DECT (DISPOSABLE) ×6 IMPLANT
STAPLER VISISTAT 35W (STAPLE) IMPLANT
SUT VIC AB 0 CT1 18XCR BRD8 (SUTURE) ×8 IMPLANT
SUT VIC AB 0 CT1 27 (SUTURE) ×3
SUT VIC AB 0 CT1 27XBRD ANBCTR (SUTURE) ×2 IMPLANT
SUT VIC AB 0 CT1 36 (SUTURE) ×6 IMPLANT
SUT VIC AB 0 CT1 8-18 (SUTURE) ×12
SUT VIC AB 2-0 CT1 27 (SUTURE) ×3
SUT VIC AB 2-0 CT1 TAPERPNT 27 (SUTURE) ×2 IMPLANT
SUT VIC AB 4-0 PS2 27 (SUTURE) IMPLANT
SUT VICRYL 0 TIES 12 18 (SUTURE) ×3 IMPLANT
SYR CONTROL 10ML LL (SYRINGE) ×3 IMPLANT
TOWEL OR 17X24 6PK STRL BLUE (TOWEL DISPOSABLE) ×6 IMPLANT
TRAY FOLEY CATH SILVER 14FR (SET/KITS/TRAYS/PACK) ×3 IMPLANT
WATER STERILE IRR 1000ML POUR (IV SOLUTION) ×3 IMPLANT

## 2014-12-23 NOTE — Transfer of Care (Signed)
Immediate Anesthesia Transfer of Care Note  Patient: Maria Logan  Procedure(s) Performed: Procedure(s) with comments: HYSTERECTOMY ABDOMINAL (N/A) - Requested 12/23/14 @ 1:00p BILATERAL SALPINGECTOMY (Bilateral)  Patient Location: PACU  Anesthesia Type:General  Level of Consciousness: awake, alert , oriented and patient cooperative  Airway & Oxygen Therapy: Patient Spontanous Breathing and Patient connected to nasal cannula oxygen  Post-op Assessment: Report given to RN and Post -op Vital signs reviewed and stable  Post vital signs: Reviewed and stable  Last Vitals:  Filed Vitals:   12/23/14 1041  BP: 109/75  Pulse: 65  Temp: 36.7 C  Resp: 18    Complications: No apparent anesthesia complications

## 2014-12-23 NOTE — Anesthesia Procedure Notes (Signed)
Procedure Name: Intubation Date/Time: 12/23/2014 11:54 AM Performed by: Georgeanne Nim Pre-anesthesia Checklist: Patient identified, Patient being monitored, Emergency Drugs available, Timeout performed and Suction available Patient Re-evaluated:Patient Re-evaluated prior to inductionOxygen Delivery Method: Circle system utilized Preoxygenation: Pre-oxygenation with 100% oxygen Intubation Type: IV induction Ventilation: Mask ventilation without difficulty Laryngoscope Size: Mac and 3 Grade View: Grade I Tube type: Oral Tube size: 7.0 mm Number of attempts: 1 Airway Equipment and Method: Stylet Placement Confirmation: ETT inserted through vocal cords under direct vision,  breath sounds checked- equal and bilateral,  positive ETCO2 and CO2 detector Secured at: 20 cm Tube secured with: Tape Dental Injury: Teeth and Oropharynx as per pre-operative assessment

## 2014-12-23 NOTE — H&P (Signed)
Patient ID: Maria Logan, female DOB: [birth year], 49 y.o. MRN: [id number]  Chief Complaint  Patient presents with  . Vaginal Bleeding  DUB 2 weeks, h/o fibroid  HPI Maria Logan is a 49 y.o. female. [name] Patient's last menstrual period was 10/06/2014.She was originally followed by Dr Ruthann Cancer for fibroid uterus and heavy menses. She received Lupron Depot and po provera. Now would like a hysterectomy. The procedure and the risk of anesthesia, bleeding, infection, bowel and bladder injury were discussed and her questions were answered.   HPI  Past Medical History  Diagnosis Date  . Fibroids     Past Surgical History  Procedure Laterality Date  . Examination under anesthesia N/A 06/04/2014    Procedure: EXAM UNDER ANESTHESIA; Surgeon: Guss Bunde, MD; Location: [facility]; Service: Gynecology; Laterality: N/A;  . Operative ultrasound N/A 06/04/2014    Procedure: OPERATIVE ULTRASOUND; Surgeon: Guss Bunde, MD; Location: [facility]; Service: Gynecology; Laterality: N/A;    Family History  Problem Relation Age of Onset  . Alcohol abuse Neg Hx   . Arthritis Neg Hx   . Asthma Neg Hx   . Birth defects Neg Hx   . Cancer Neg Hx   . COPD Neg Hx   . Depression Neg Hx   . Diabetes Neg Hx   . Drug abuse Neg Hx   . Early death Neg Hx   . Hearing loss Neg Hx   . Heart disease Neg Hx   . Hyperlipidemia Neg Hx   . Hypertension Neg Hx   . Kidney disease Neg Hx   . Learning disabilities Neg Hx   . Mental illness Neg Hx   . Mental retardation Neg Hx   . Miscarriages / Stillbirths Neg Hx   . Stroke Neg Hx   . Vision loss Neg Hx   . Varicose Veins Neg Hx     Social History Social History  Substance Use Topics  . Smoking status: Never Smoker   . Smokeless tobacco: Never Used  . Alcohol Use: No    No Known Allergies  Current  Outpatient Prescriptions  Medication Sig Dispense Refill  . diclofenac sodium (VOLTAREN) 1 % GEL Apply 4 g topically 4 (four) times daily. 100 g 1  . medroxyPROGESTERone (PROVERA) 10 MG tablet Take 2 tablets (20 mg total) by mouth daily. 30 tablet 2   Current Facility-Administered Medications  Medication Dose Route Frequency Provider Last Rate Last Dose  . leuprolide (LUPRON) injection 11.25 mg 11.25 mg Intramuscular Once Woodroe Mode, MD    . medroxyPROGESTERone (DEPO-PROVERA) injection 150 mg 150 mg Intramuscular Q90 days Osborne Oman, MD  150 mg at 08/27/14 1343    Review of Systems Review of Systems  Constitutional: Negative.  Respiratory: Negative.  Gastrointestinal: Negative.  Genitourinary: Positive for vaginal bleeding, vaginal discharge, menstrual problem and pelvic pain.    Filed Vitals:   12/23/14 1041  BP: 109/75  Pulse: 65  Temp: 98 F (36.7 C)  Resp: 18     Physical Exam Physical Exam  Constitutional: She is oriented to person, place, and time. She appears well-developed. No distress.  Pulmonary/Chest: Effort normal.  Abdominal: Soft. She exhibits mass (to umbilicus).  Genitourinary: Vagina normal.  Dark blood, cx nl, uterus 18 week size  Neurological: She is alert and oriented to person, place, and time.  Skin: Skin is warm and dry.  Psychiatric: She has a normal mood and affect. Her behavior is normal.    Patient given post procedure  instructions. Data Reviewed CBC  Labs (Brief)       Component Value Date/Time   WBC 6.9 07/16/2014 1544   RBC 3.63* 07/16/2014 1544   HGB 10.5* 07/16/2014 1544   HCT 32.2* 07/16/2014 1544   PLT 299 07/16/2014 1544   MCV 88.7 07/16/2014 1544   MCH 28.9 07/16/2014 1544   MCHC 32.6 07/16/2014 1544   RDW 12.6 07/16/2014 1544   LYMPHSABS 1.0 05/28/2014 0800   MONOABS 1.2* 05/28/2014 0800   EOSABS 0.0 05/28/2014  0800   BASOSABS 0.0 05/28/2014 0800      CBC    Component Value Date/Time   WBC 6.4 12/11/2014 0850   RBC 4.38 12/11/2014 0850   HGB 12.7 12/11/2014 0850   HCT 38.6 12/11/2014 0850   PLT 223 12/11/2014 0850   MCV 88.1 12/11/2014 0850   MCH 29.0 12/11/2014 0850   MCHC 32.9 12/11/2014 0850   RDW 12.9 12/11/2014 0850   LYMPHSABS 1.0 05/28/2014 0800   MONOABS 1.2* 05/28/2014 0800   EOSABS 0.0 05/28/2014 0800   BASOSABS 0.0 05/28/2014 0800     CLINICAL DATA: Acute left pelvic pain for 1 week  EXAM: TRANSABDOMINAL AND TRANSVAGINAL ULTRASOUND OF PELVIS  TECHNIQUE: Both transabdominal and transvaginal ultrasound examinations of the pelvis were performed. Transabdominal technique was performed for global imaging of the pelvis including uterus, ovaries, adnexal regions, and pelvic cul-de-sac. It was necessary to proceed with endovaginal exam following the transabdominal exam to visualize the uterus and adnexae in better detail.  COMPARISON: 07/08/2009, 05/25/2010  FINDINGS: Uterus  Measurements: 14 x 9.3 x 9.9 cm. Diffuse heterogeneous enlargement. Two heterogeneous dominant fibroids noted. 1 in the fundus measures 9.5 x 9.4 x 0.5 cm. This is the largest fibroid. Second measurable uterine fibroid is right fundal measuring 4.7 x 4.4 x 4.4 cm. These appear larger than on prior studies. Cervical nabothian cysts evident.  Endometrium  Thickness: Not visualized. Obscured by the fibroids.  Right ovary  Measurements: 1.4 x 0.9 x 1.1 cm. Normal appearance/no adnexal mass.  Left ovary  Measurements: 3.9 x 1.5 x 3.3 cm. Grossly normal appearance. Only seen transabdominally.  Other findings  No free fluid.  IMPRESSION: Enlarged fibroid uterus. Uterine fibroids appear larger than the 2 comparison studies.  No other significant acute process by ultrasound.   Electronically Signed  By: Daryll Brod M.D.  On: 12/14/2013 12:16         Assessment    Patient Active Problem List   Diagnosis Date Noted  . Complete miscarriage 08/27/2014  . Inappropriate change in quantitative hCG in early pregnancy 07/28/2014  . Intramural leiomyoma of uterus   Sx fibroids, large DUB, menorrhagia     Plan She had benign tissue on endometrial biopsy. Negative pap with cotesting 08/2010 Offered TAH/BS for fibroids. The procedure and the risk of anesthesia, bleeding, infection, bowel and bladder injury,were discussed and her questions were answered.             Tekela Garguilo    12/23/2014

## 2014-12-23 NOTE — Op Note (Signed)
[facility] [place] PROCEDURE DATE: 12/23/2014  PREOPERATIVE DIAGNOSES:  Symptomatic fibroids, abnormal uterine bleeding POSTOPERATIVE DIAGNOSES:  The same SURGEON:   Woodroe Mode, MD  ASSISTANT: Philippa Chester, M.D. OPERATION:  Total abdominal hysterectomy, Bilateral Salpingectomy  ANESTHESIA:  General endotracheal.  INDICATIONS: The patient is a 49 y.o. [name] with the aforementioned diagnoses who desires definitive surgical management. On the preoperative visit, the risks, benefits, indications, and alternatives of the procedure were reviewed with the patient.  On the day of surgery, the risks of surgery were again discussed with the patient including but not limited to: bleeding which may require transfusion or reoperation; infection which may require antibiotics; injury to bowel, bladder, ureters or other surrounding organs; need for additional procedures; thromboembolic phenomenon, incisional problems and other postoperative/anesthesia complications. Written informed consent was obtained.    OPERATIVE FINDINGS: A 16 week size uterus with normal tubes and ovaries bilaterally.  ESTIMATED BLOOD LOSS: 300 ml FLUIDS:  2000 ml of Lactated Ringers URINE OUTPUT:  100 ml of clear yellow urine. SPECIMENS:  Uterus,cervix,  bilateral fallopian tubes  sent to pathology COMPLICATIONS:  None immediate.   DESCRIPTION OF PROCEDURE:  The patient received intravenous antibiotics and had sequential compression devices applied to her lower extremities while in the preoperative area.   She was taken to the operating room and placed under general anesthesia without difficulty.The abdomen and perineum were prepped and draped in a sterile manner, and she was placed in a dorsal supine position.  A Foley catheter was inserted into the bladder and attached to constant drainage. After an adequate timeout was performed, a Pfannensteil skin incision was made. This incision was taken down to the fascia using  electrocautery with care given to maintain good hemostasis. The fascia was incised in the midline and the fascial incision was then extended bilaterally using electrocautery without difficulty. The fascia was then dissected off the underlying rectus muscles using blunt and sharp dissection. The rectus muscles were split bluntly in the midline and the peritoneum entered sharply without complication. This peritoneal incision was then extended superiorly and inferiorly with care given to prevent bowel or bladder injury. Attention was then turned to the pelvis.The uterus at this point was noted to be mobilized and was delivered up out of the abdomen.  The round ligaments on each side were clamped, suture ligated with 0 Vicryl, and transected with electrocautery allowing entry into the broad ligament. Of note, all sutures used in this procedure are 0 Vicryl unless otherwise noted. The anterior and posterior leaves of the broad ligament were separated, and the ureters were inspected to be safely away from the area of dissection bilaterally.  Adnexa were clamped on the patient's right side, cut, and doubly suture ligated. This procedure was repeated in an identical fashion on the left site allowing for both adnexa to remain in place.  Kelly clamps were placed on the mesosalpinx of the right fallopian tube, and the fallopian tube was excised.  The pedicle was then secured with a free tie.  A similar process was carried out on the left side, allowing for bilateral salpingectomy.    .  A bladder flap was then created.  The bladder was then bluntly dissected off the lower uterine segment and cervix with good hemostasis noted. The uterine arteries were then skeletonized bilaterally and then clamped, cut, and doubly suture ligated with care given to prevent ureteral injury. The uterosacral ligaments were then clamped, cut, and ligated bilaterally.  Finally, the cardinal ligaments were clamped,  cut, and ligated bilaterally.   Acutely curved clamps were placed across the vagina just under the cervix, and the specimen was amputated and sent to pathology. The vaginal cuff angles were closed with Heaney stiches with care given to incorporate the uterosacral-cardinal ligament pedicles on both sides. The middle of the vaginal cuff was closed with an interrupted figure-of-eight suture with care given to incorporate the anterior pubocervical fascia and the posterior rectovaginal fascia.   The pelvis was irrigated and hemostasis was reconfirmed at all pedicles and along the pelvic sidewall.  The ureters were inspected and noted to be peristalsing bilaterally.  All laparotomy sponges and instruments were removed from the abdomen. The peritoneum was closed with a running stitch, and the fascia was also closed in a running fashion.  The skin was closed with a 4-0 Vicryl subcuticular stitch. 0.5% Marcaine was injected subcutaneously.  A sterile dressing was applied. Sponge, lap, needle, and instrument counts were correct times two. The patient was taken to the recovery area awake, extubated and in stable condition.  Woodroe Mode, MD Attending [place], [facility]

## 2014-12-23 NOTE — Anesthesia Preprocedure Evaluation (Addendum)
Anesthesia Evaluation  Patient identified by MRN, date of birth, ID band Patient awake    Reviewed: Allergy & Precautions, NPO status , Patient's Chart, lab work & pertinent test results  Airway Mallampati: II  TM Distance: >3 FB Neck ROM: Full    Dental no notable dental hx. (+) Teeth Intact   Pulmonary neg pulmonary ROS,    Pulmonary exam normal breath sounds clear to auscultation       Cardiovascular negative cardio ROS Normal cardiovascular exam Rhythm:Regular Rate:Normal     Neuro/Psych negative neurological ROS  negative psych ROS   GI/Hepatic negative GI ROS, Neg liver ROS,   Endo/Other  Obesity  Renal/GU negative Renal ROS  negative genitourinary   Musculoskeletal negative musculoskeletal ROS (+)   Abdominal (+) + obese,   Peds  Hematology negative hematology ROS (+)   Anesthesia Other Findings   Reproductive/Obstetrics Uterine Fibroids                            Anesthesia Physical  Anesthesia Plan  ASA: I  Anesthesia Plan: General   Post-op Pain Management:    Induction: Intravenous  Airway Management Planned: Oral ETT  Additional Equipment:   Intra-op Plan:   Post-operative Plan: Extubation in OR  Informed Consent: I have reviewed the patients History and Physical, chart, labs and discussed the procedure including the risks, benefits and alternatives for the proposed anesthesia with the patient or authorized representative who has indicated his/her understanding and acceptance.   Dental advisory given  Plan Discussed with: Anesthesiologist, CRNA and Surgeon  Anesthesia Plan Comments:        Anesthesia Quick Evaluation

## 2014-12-23 NOTE — Anesthesia Postprocedure Evaluation (Signed)
Anesthesia Post Note  Patient: HETTIE GALLMEYER  Procedure(s) Performed: Procedure(s) (LRB): HYSTERECTOMY ABDOMINAL (N/A) BILATERAL SALPINGECTOMY (Bilateral)  Patient location during evaluation: PACU Anesthesia Type: General Level of consciousness: awake and alert and oriented Pain management: pain level controlled Vital Signs Assessment: post-procedure vital signs reviewed and stable Respiratory status: spontaneous breathing, nonlabored ventilation, respiratory function stable and patient connected to nasal cannula oxygen Cardiovascular status: blood pressure returned to baseline and stable Postop Assessment: no signs of nausea or vomiting Anesthetic complications: no    Last Vitals:  Filed Vitals:   12/23/14 1415 12/23/14 1430  BP: 133/77 137/71  Pulse: 72 72  Temp:    Resp: 16 14    Last Pain:  Filed Vitals:   12/23/14 1442  PainSc: 6                  Vennie Waymire A.

## 2014-12-24 ENCOUNTER — Encounter (HOSPITAL_COMMUNITY): Payer: Self-pay | Admitting: Obstetrics & Gynecology

## 2014-12-24 DIAGNOSIS — D259 Leiomyoma of uterus, unspecified: Secondary | ICD-10-CM | POA: Diagnosis present

## 2014-12-24 DIAGNOSIS — Z90710 Acquired absence of both cervix and uterus: Secondary | ICD-10-CM | POA: Diagnosis present

## 2014-12-24 LAB — CBC
HEMATOCRIT: 31.6 % — AB (ref 36.0–46.0)
HEMOGLOBIN: 10.5 g/dL — AB (ref 12.0–15.0)
MCH: 29.4 pg (ref 26.0–34.0)
MCHC: 33.2 g/dL (ref 30.0–36.0)
MCV: 88.5 fL (ref 78.0–100.0)
Platelets: 195 10*3/uL (ref 150–400)
RBC: 3.57 MIL/uL — AB (ref 3.87–5.11)
RDW: 13 % (ref 11.5–15.5)
WBC: 11.6 10*3/uL — ABNORMAL HIGH (ref 4.0–10.5)

## 2014-12-24 MED ORDER — DIPHENHYDRAMINE HCL 50 MG/ML IJ SOLN: 25.0000 mg | Freq: Four times a day (QID) | INTRAMUSCULAR | Status: DC | PRN

## 2014-12-24 MED ORDER — DIPHENHYDRAMINE HCL 25 MG PO CAPS
50.0000 mg | ORAL_CAPSULE | Freq: Four times a day (QID) | ORAL | Status: DC | PRN
  Administered 2014-12-24: 50 mg via ORAL
  Filled 2014-12-24: qty 2

## 2014-12-24 NOTE — Progress Notes (Signed)
1 Day Post-Op Procedure(s) (LRB): HYSTERECTOMY ABDOMINAL (N/A) BILATERAL SALPINGECTOMY (Bilateral)  Subjective: Patient reports incisional pain and tolerating PO.    Objective: I have reviewed patient's vital signs, medications and labs. Blood pressure 107/57, pulse 61, temperature 97.8 F (36.6 C), temperature source Oral, resp. rate 17, SpO2 100 %.  General: alert, cooperative and no distress GI: soft, non-tender; bowel sounds normal; no masses,  no organomegaly and incision: clean, dry and intact Extremities: extremities normal, atraumatic, no cyanosis or edema CBC    Component Value Date/Time   WBC 11.6* 12/24/2014 0540   RBC 3.57* 12/24/2014 0540   HGB 10.5* 12/24/2014 0540   HCT 31.6* 12/24/2014 0540   PLT 195 12/24/2014 0540   MCV 88.5 12/24/2014 0540   MCH 29.4 12/24/2014 0540   MCHC 33.2 12/24/2014 0540   RDW 13.0 12/24/2014 0540   LYMPHSABS 1.0 05/28/2014 0800   MONOABS 1.2* 05/28/2014 0800   EOSABS 0.0 05/28/2014 0800   BASOSABS 0.0 05/28/2014 0800     Assessment: s/p Procedure(s) with comments: HYSTERECTOMY ABDOMINAL (N/A) - Requested 12/23/14 @ 1:00p BILATERAL SALPINGECTOMY (Bilateral): progressing well  Plan: Advance diet Encourage ambulation Advance to PO medication  LOS: 1 day    Saydie Gerdts 12/24/2014, 8:53 AM

## 2014-12-24 NOTE — Progress Notes (Signed)
I offered spiritual support and blessing to pt following her surgery.  She was very appreciative and very grateful to be on the other side of her surgery.  She had good family support present during my visit and was in good spirits.  [place], [place] Pager, [phone] 4:18 PM    12/24/14 1600  Clinical Encounter Type  Visited With Patient and family together  Visit Type Spiritual support  Referral From Nurse

## 2014-12-25 MED ORDER — BISACODYL 10 MG RE SUPP
10.0000 mg | Freq: Once | RECTAL | Status: AC
  Administered 2014-12-25: 10 mg via RECTAL
  Filled 2014-12-25: qty 1

## 2014-12-25 MED ORDER — OXYCODONE-ACETAMINOPHEN 5-325 MG PO TABS: 1.0000 | ORAL_TABLET | ORAL | Status: DC | PRN

## 2014-12-25 NOTE — Discharge Summary (Signed)
Physician Discharge Summary  Patient ID: Maria Logan MRN: [id number] DOB/AGE: [birth year] 49 y.o.  Admit date: 12/23/2014 Discharge date: 12/25/2014  Admission Diagnoses:fibroid uterus and menorrhagia Discharge Diagnoses: same Active Problems:   Fibroid uterus   S/P TAH (total abdominal hysterectomy) bilateral salpingectomy  Discharged Condition: good  Hospital Course:   Chief Complaint  Patient presents with  . Vaginal Bleeding  DUB 2 weeks, h/o fibroid  HPI Maria Logan is a 49 y.o. female. [name] Patient's last menstrual period was 10/06/2014.She was originally followed by Dr Ruthann Cancer for fibroid uterus and heavy menses. She received Lupron Depot and po provera. Now would like a hysterectomy. The procedure and the risk of anesthesia, bleeding, infection, bowel and bladder injury were discussed and her questions were answered.   HPI  Past Medical History  Diagnosis Date  . Fibroids     Past Surgical History  Procedure Laterality Date  . Examination under anesthesia N/A 06/04/2014    Procedure: EXAM UNDER ANESTHESIA; Surgeon: Guss Bunde, MD; Location: [facility]; Service: Gynecology; Laterality: N/A;  . Operative ultrasound N/A 06/04/2014    Procedure: OPERATIVE ULTRASOUND; Surgeon: Guss Bunde, MD; Location: [facility]; Service: Gynecology; Laterality: N/A;    Family History  Problem Relation Age of Onset  . Alcohol abuse Neg Hx   . Arthritis Neg Hx   . Asthma Neg Hx   . Birth defects Neg Hx   . Cancer Neg Hx   . COPD Neg Hx   . Depression Neg Hx   . Diabetes Neg Hx   . Drug abuse Neg Hx   . Early death Neg Hx   . Hearing loss Neg Hx   . Heart disease Neg Hx   . Hyperlipidemia Neg Hx   . Hypertension Neg Hx   . Kidney disease Neg Hx   . Learning  disabilities Neg Hx   . Mental illness Neg Hx   . Mental retardation Neg Hx   . Miscarriages / Stillbirths Neg Hx   . Stroke Neg Hx   . Vision loss Neg Hx   . Varicose Veins Neg Hx     Social History Social History  Substance Use Topics  . Smoking status: Never Smoker   . Smokeless tobacco: Never Used  . Alcohol Use: No    No Known Allergies  Current Outpatient Prescriptions  Medication Sig Dispense Refill  . diclofenac sodium (VOLTAREN) 1 % GEL Apply 4 g topically 4 (four) times daily. 100 g 1  . medroxyPROGESTERone (PROVERA) 10 MG tablet Take 2 tablets (20 mg total) by mouth daily. 30 tablet 2   Current Facility-Administered Medications  Medication Dose Route Frequency Provider Last Rate Last Dose  . leuprolide (LUPRON) injection 11.25 mg 11.25 mg Intramuscular Once Woodroe Mode, MD    . medroxyPROGESTERone (DEPO-PROVERA) injection 150 mg 150 mg Intramuscular Q90 days Osborne Oman, MD  150 mg at 08/27/14 1343    Review of Systems Review of Systems  Constitutional: Negative.  Respiratory: Negative.  Gastrointestinal: Negative.            Consults: None  Significant Diagnostic Studies: labs:  CBC    Component Value Date/Time   WBC 11.6* 12/24/2014 0540   RBC 3.57* 12/24/2014 0540   HGB 10.5* 12/24/2014 0540   HCT 31.6* 12/24/2014 0540   PLT 195 12/24/2014 0540   MCV 88.5 12/24/2014 0540   MCH 29.4 12/24/2014 0540   MCHC 33.2 12/24/2014 0540   RDW 13.0 12/24/2014 0540  LYMPHSABS 1.0 05/28/2014 0800   MONOABS 1.2* 05/28/2014 0800   EOSABS 0.0 05/28/2014 0800   BASOSABS 0.0 05/28/2014 0800      Treatments: surgery: TAH/BS  Discharge Exam: Blood pressure 121/68, pulse 89, temperature 99.4 F (37.4 C), temperature source Oral, resp. rate 16, height 5\' 6"  (1.676 m), weight 174 lb 6  oz (79.096 kg), SpO2 99 %. General appearance: alert, cooperative and no distress GI: soft, non-tender; bowel sounds normal; no masses,  no organomegaly Extremities: extremities normal, atraumatic, no cyanosis or edema Incision/Wound:dressing dry and intact  Disposition: 01-Home or Self Care     Medication List    STOP taking these medications        medroxyPROGESTERone 10 MG tablet  Commonly known as:  PROVERA      TAKE these medications        diclofenac sodium 1 % Gel  Commonly known as:  VOLTAREN  Apply 4 g topically 4 (four) times daily.     oxyCODONE-acetaminophen 5-325 MG tablet  Commonly known as:  PERCOCET/ROXICET  Take 1-2 tablets by mouth every 4 (four) hours as needed for severe pain (moderate to severe pain (when tolerating fluids)).           Follow-up Information    Follow up with [facility] In 4 weeks.   Specialty:  Obstetrics and Gynecology   Contact information:   [place] [phone]      Signed: Emeterio Reeve 12/25/2014, 9:20 AM

## 2014-12-25 NOTE — Progress Notes (Signed)
Pt d/c  Ambulated out teaching complete

## 2014-12-25 NOTE — Plan of Care (Signed)
Problem: Bowel/Gastric: Goal: Will not experience complications related to bowel motility Outcome: Completed/Met Date Met:  12/25/14 Pt will use stool softner as needed

## 2014-12-25 NOTE — Discharge Instructions (Signed)
Abdominal Hysterectomy, Care After °Refer to this sheet in the next few weeks. These instructions provide you with information on caring for yourself after your procedure. Your health care provider may also give you more specific instructions. Your treatment has been planned according to current medical practices, but problems sometimes occur. Call your health care provider if you have any problems or questions after your procedure.  °WHAT TO EXPECT AFTER THE PROCEDURE °After your procedure, it is typical to have the following: °· Pain. °· Feeling tired. °· Poor appetite. °· Less interest in sex. °It takes 4-6 weeks to recover from this surgery.  °HOME CARE INSTRUCTIONS  °· Take pain medicines only as directed by your health care provider. Do not take over-the-counter pain medicines without checking with your health care provider first.  °· Change your bandage as directed by your health care provider. °· Return to your health care provider to have your sutures taken out. °· Take showers instead of baths for 2-3 weeks. Ask your health care provider when it is safe to start showering.  °· Do not douche, use tampons, or have sexual intercourse for at least 6 weeks or until your health care provider says you can.   °· Follow your health care provider's advice about exercise, lifting, driving, and general activities. °· Get plenty of rest and sleep.   °· Do not lift anything heavier than a gallon of milk (about 10 lb [4.5 kg]) for the first month after surgery. °· You can resume your normal diet if your health care provider says it is okay.   °· Do not drink alcohol until your health care provider says you can.   °· If you are constipated, ask your health care provider if you can take a mild laxative. °· Eating foods high in fiber may also help with constipation. Eat plenty of raw fruits and vegetables, whole grains, and beans. °· Drink enough fluids to keep your urine clear or pale yellow.   °· Try to have someone at  home with you for the first 1-2 weeks to help around the house. °· Keep all follow-up appointments. °SEEK MEDICAL CARE IF:  °· You have chills or fever. °· You have swelling, redness, or pain in the area of your incision that is getting worse.   °· You have pus coming from the incision.   °· You notice a bad smell coming from the incision or bandage.   °· Your incision breaks open.   °· You feel dizzy or light-headed.   °· You have pain or bleeding when you urinate.   °· You have persistent diarrhea.   °· You have persistent nausea and vomiting.   °· You have abnormal vaginal discharge.   °· You have a rash.   °· You have any type of abnormal reaction or develop an allergy to your medicine.   °· Your pain medicine is not helping.   °SEEK IMMEDIATE MEDICAL CARE IF:  °· You have a fever and your symptoms suddenly get worse. °· You have severe abdominal pain. °· You have chest pain. °· You have shortness of breath. °· You faint. °· You have pain, swelling, or redness of your leg. °· You have heavy vaginal bleeding with blood clots. °MAKE SURE YOU: °· Understand these instructions. °· Will watch your condition. °· Will get help right away if you are not doing well or get worse. °  °This information is not intended to replace advice given to you by your health care provider. Make sure you discuss any questions you have with your health care provider. °  °Document   Released: 07/16/2004 Document Revised: 01/17/2014 Document Reviewed: 10/19/2012 °Elsevier Interactive Patient Education ©2016 [employer] ° °

## 2014-12-26 ENCOUNTER — Emergency Department (HOSPITAL_COMMUNITY)
Admission: EM | Admit: 2014-12-26 | Discharge: 2014-12-27 | Disposition: A | Discharge status: Home / Self Care | Payer: Medicaid Other | Attending: Emergency Medicine | Admitting: Emergency Medicine

## 2014-12-26 ENCOUNTER — Encounter (HOSPITAL_COMMUNITY): Payer: Self-pay | Admitting: Emergency Medicine

## 2014-12-26 DIAGNOSIS — K5903 Drug induced constipation: Secondary | ICD-10-CM

## 2014-12-26 DIAGNOSIS — K5909 Other constipation: Secondary | ICD-10-CM | POA: Diagnosis not present

## 2014-12-26 DIAGNOSIS — T402X5A Adverse effect of other opioids, initial encounter: Secondary | ICD-10-CM | POA: Insufficient documentation

## 2014-12-26 DIAGNOSIS — Z90710 Acquired absence of both cervix and uterus: Secondary | ICD-10-CM | POA: Diagnosis not present

## 2014-12-26 DIAGNOSIS — R1084 Generalized abdominal pain: Secondary | ICD-10-CM | POA: Insufficient documentation

## 2014-12-26 DIAGNOSIS — R42 Dizziness and giddiness: Secondary | ICD-10-CM | POA: Insufficient documentation

## 2014-12-26 DIAGNOSIS — G8918 Other acute postprocedural pain: Secondary | ICD-10-CM

## 2014-12-26 DIAGNOSIS — Z86018 Personal history of other benign neoplasm: Secondary | ICD-10-CM | POA: Diagnosis not present

## 2014-12-26 LAB — CBC WITH DIFFERENTIAL/PLATELET
BASOS ABS: 0 10*3/uL (ref 0.0–0.1)
BASOS PCT: 0 %
Eosinophils Absolute: 0.1 10*3/uL (ref 0.0–0.7)
Eosinophils Relative: 1 %
HEMATOCRIT: 35.3 % — AB (ref 36.0–46.0)
Hemoglobin: 11.5 g/dL — ABNORMAL LOW (ref 12.0–15.0)
Lymphocytes Relative: 9 %
Lymphs Abs: 0.9 10*3/uL (ref 0.7–4.0)
MCH: 29.1 pg (ref 26.0–34.0)
MCHC: 32.6 g/dL (ref 30.0–36.0)
MCV: 89.4 fL (ref 78.0–100.0)
MONO ABS: 1 10*3/uL (ref 0.1–1.0)
Monocytes Relative: 10 %
NEUTROS ABS: 8.6 10*3/uL — AB (ref 1.7–7.7)
Neutrophils Relative %: 80 %
Platelets: 213 10*3/uL (ref 150–400)
RBC: 3.95 MIL/uL (ref 3.87–5.11)
RDW: 12.4 % (ref 11.5–15.5)
WBC: 10.5 10*3/uL (ref 4.0–10.5)

## 2014-12-26 LAB — COMPREHENSIVE METABOLIC PANEL
ALBUMIN: 3.4 g/dL — AB (ref 3.5–5.0)
ALT: 16 U/L (ref 14–54)
AST: 20 U/L (ref 15–41)
Alkaline Phosphatase: 53 U/L (ref 38–126)
Anion gap: 9 (ref 5–15)
BILIRUBIN TOTAL: 0.8 mg/dL (ref 0.3–1.2)
BUN: 5 mg/dL — AB (ref 6–20)
CHLORIDE: 104 mmol/L (ref 101–111)
CO2: 26 mmol/L (ref 22–32)
Calcium: 9.2 mg/dL (ref 8.9–10.3)
Creatinine, Ser: 0.89 mg/dL (ref 0.44–1.00)
GFR calc Af Amer: >60 mL/min (ref >60)
GFR calc non Af Amer: >60 mL/min (ref >60)
GLUCOSE: 118 mg/dL — AB (ref 65–99)
POTASSIUM: 3.2 mmol/L — AB (ref 3.5–5.1)
Sodium: 139 mmol/L (ref 135–145)
TOTAL PROTEIN: 6.9 g/dL (ref 6.5–8.1)

## 2014-12-26 LAB — LIPASE, BLOOD: Lipase: 36 U/L (ref 11–51)

## 2014-12-26 MED ORDER — MORPHINE SULFATE (PF) 4 MG/ML IV SOLN
4.0000 mg | Freq: Once | INTRAVENOUS | Status: AC
  Administered 2014-12-26: 4 mg via INTRAVENOUS
  Filled 2014-12-26: qty 1

## 2014-12-26 MED ORDER — SODIUM CHLORIDE 0.9 % IV BOLUS (SEPSIS)
1000.0000 mL | Freq: Once | INTRAVENOUS | Status: AC
  Administered 2014-12-26: 1000 mL via INTRAVENOUS

## 2014-12-26 MED ORDER — ONDANSETRON HCL 4 MG/2ML IJ SOLN
4.0000 mg | Freq: Once | INTRAMUSCULAR | Status: AC
  Administered 2014-12-26: 4 mg via INTRAVENOUS
  Filled 2014-12-26: qty 2

## 2014-12-26 MED ORDER — MORPHINE SULFATE (PF) 4 MG/ML IV SOLN
4.0000 mg | Freq: Once | INTRAVENOUS | Status: AC
  Administered 2014-12-27: 4 mg via INTRAVENOUS
  Filled 2014-12-26: qty 1

## 2014-12-26 NOTE — ED Notes (Signed)
Pt presents from home via EMS c/o 10/10 lower abdominal pain after hysterectomy on Tuesday 12/13. No nausea, vomiting, or fever.  Pt states she has felt slightly light-headed today as well.  She is moaning and restless on assessment.  No BM since Tuesday. Vitals WNL, NSR on EKG per EMS.

## 2014-12-26 NOTE — ED Provider Notes (Signed)
CSN: [id number]     Arrival date & time 12/26/14  2121 History   First MD Initiated Contact with Patient 12/26/14 2144     Chief Complaint  Patient presents with  . Post-op Problem     (Consider location/radiation/quality/duration/timing/severity/associated sxs/prior Treatment) HPI Comments: Abdominal pain near incision Across and shoots upwards, spasms to back and stays 10/10 Constipation Haven't moved bowels since Monday Tried miralax, took 1 cap earlier, then 1 cap later today     Past Medical History  Diagnosis Date  . Fibroids   . Vaginal delivery 1988, 1990, 2005   Past Surgical History  Procedure Laterality Date  . Examination under anesthesia N/A 06/04/2014    Procedure: EXAM UNDER ANESTHESIA;  Surgeon: Guss Bunde, MD;  Location: [facility];  Service: Gynecology;  Laterality: N/A;  . Operative ultrasound N/A 06/04/2014    Procedure: OPERATIVE ULTRASOUND;  Surgeon: Guss Bunde, MD;  Location: [facility];  Service: Gynecology;  Laterality: N/A;  . Abdominal hysterectomy N/A 12/23/2014    Procedure: HYSTERECTOMY ABDOMINAL;  Surgeon: Woodroe Mode, MD;  Location: [facility];  Service: Gynecology;  Laterality: N/A;  Requested 12/23/14 @ 1:00p  . Bilateral salpingectomy Bilateral 12/23/2014    Procedure: BILATERAL SALPINGECTOMY;  Surgeon: Woodroe Mode, MD;  Location: [facility];  Service: Gynecology;  Laterality: Bilateral;   Family History  Problem Relation Age of Onset  . Alcohol abuse Neg Hx   . Arthritis Neg Hx   . Asthma Neg Hx   . Birth defects Neg Hx   . Cancer Neg Hx   . COPD Neg Hx   . Depression Neg Hx   . Diabetes Neg Hx   . Drug abuse Neg Hx   . Early death Neg Hx   . Hearing loss Neg Hx   . Heart disease Neg Hx   . Hyperlipidemia Neg Hx   . Hypertension Neg Hx   . Kidney disease Neg Hx   . Learning disabilities Neg Hx   . Mental illness Neg Hx   . Mental retardation Neg Hx   . Miscarriages / Stillbirths Neg Hx   . Stroke Neg Hx   . Vision loss Neg  Hx   . Varicose Veins Neg Hx    Social History  Substance Use Topics  . Smoking status: Never Smoker   . Smokeless tobacco: Never Used  . Alcohol Use: No   OB History    Gravida Para Term Preterm AB TAB SAB Ectopic Multiple Living   4 3 3  1  1   3      Review of Systems  Constitutional: Positive for chills. Negative for fever.  HENT: Negative for sore throat.   Eyes: Negative for visual disturbance.  Respiratory: Negative for cough and shortness of breath.   Cardiovascular: Negative for chest pain.  Gastrointestinal: Positive for abdominal pain and constipation. Negative for nausea, vomiting, diarrhea and blood in stool.  Genitourinary: Negative for dysuria, frequency and difficulty urinating.  Musculoskeletal: Negative for back pain and neck pain.  Skin: Negative for rash.  Neurological: Positive for light-headedness. Negative for syncope and headaches.      Allergies  Review of patient's allergies indicates no known allergies.  Home Medications   Prior to Admission medications   Medication Sig Start Date End Date Taking? Authorizing Provider  oxyCODONE-acetaminophen (PERCOCET/ROXICET) 5-325 MG tablet Take 1-2 tablets by mouth every 4 (four) hours as needed for severe pain (moderate to severe pain (when tolerating fluids)). 12/25/14  Yes Jeneen Rinks  Diona Browner, MD  diclofenac sodium (VOLTAREN) 1 % GEL Apply 4 g topically 4 (four) times daily. Patient taking differently: Apply 4 g topically daily as needed (knee pain).  10/14/14   Antonietta Breach, PA-C  docusate sodium (COLACE) 100 MG capsule Take 1 capsule (100 mg total) by mouth 2 (two) times daily. 12/27/14   Gareth Morgan, MD  magnesium citrate SOLN Take 296 mLs (1 Bottle total) by mouth once. 12/27/14   Gareth Morgan, MD   BP 130/82 mmHg  Pulse 94  Temp(Src) 98.1 F (36.7 C) (Oral)  Resp 15  Ht 5\' 6"  (1.676 m)  Wt 165 lb (74.844 kg)  BMI 26.64 kg/m2  SpO2 100%  LMP 12/09/2014 (Approximate) Physical Exam   Constitutional: She is oriented to person, place, and time. She appears well-developed and well-nourished. No distress.  HENT:  Head: Normocephalic and atraumatic.  Eyes: Conjunctivae and EOM are normal.  Neck: Normal range of motion.  Cardiovascular: Normal rate, regular rhythm, normal heart sounds and intact distal pulses.  Exam reveals no gallop and no friction rub.   No murmur heard. Pulmonary/Chest: Effort normal and breath sounds normal. No respiratory distress. She has no wheezes. She has no rales.  Abdominal: She exhibits no distension. There is tenderness (diffuse). There is guarding.  Musculoskeletal: She exhibits no edema or tenderness.  Neurological: She is alert and oriented to person, place, and time.  Skin: Skin is warm and dry. No rash noted. She is not diaphoretic. No erythema.  Nursing note and vitals reviewed.   ED Course  Procedures (including critical care time) Labs Review Labs Reviewed  CBC WITH DIFFERENTIAL/PLATELET - Abnormal; Notable for the following:    Hemoglobin 11.5 (*)    HCT 35.3 (*)    Neutro Abs 8.6 (*)    All other components within normal limits  COMPREHENSIVE METABOLIC PANEL - Abnormal; Notable for the following:    Potassium 3.2 (*)    Glucose, Bld 118 (*)    BUN 5 (*)    Albumin 3.4 (*)    All other components within normal limits  URINALYSIS, ROUTINE W REFLEX MICROSCOPIC (NOT AT [facility]) - Abnormal; Notable for the following:    APPearance CLOUDY (*)    Specific Gravity, Urine 1.002 (*)    Hgb urine dipstick SMALL (*)    All other components within normal limits  URINE MICROSCOPIC-ADD ON - Abnormal; Notable for the following:    Squamous Epithelial / LPF 0-5 (*)    Bacteria, UA RARE (*)    All other components within normal limits  URINE CULTURE  LIPASE, BLOOD  URIC ACID    Imaging Review Ct Abdomen Pelvis W Contrast  12/27/2014  CLINICAL DATA:  Severe lower abdominal pain. Hysterectomy three days prior 12/23/2014 EXAM: CT ABDOMEN  AND PELVIS WITH CONTRAST TECHNIQUE: Multidetector CT imaging of the abdomen and pelvis was performed using the standard protocol following bolus administration of intravenous contrast. CONTRAST:  40mL OMNIPAQUE IOHEXOL 300 MG/ML SOLN, 163mL OMNIPAQUE IOHEXOL 300 MG/ML SOLN COMPARISON:  Preoperative MRI 05/15/2014 FINDINGS: Lower chest:  Minimal dependent atelectasis at the lung bases. Liver: No focal lesion. Hepatobiliary: Gallbladder physiologically distended, no biliary dilatation. Pancreas: No ductal dilatation or inflammation. Spleen: Normal. Adrenal glands: No nodule. Kidneys: Symmetric renal enhancement and excretion. No hydronephrosis. Bilateral renal cysts, largest measuring 3.1 cm in the lower left kidney. No perinephric stranding. Stomach/Bowel: Stomach physiologically distended. There are no dilated or thickened small bowel loops. Moderate to large stool burden in the ascending, transverse,  and proximal descending colon, the distal most colon is decompressed. The appendix is tentatively identified and normal. Vascular/Lymphatic: No retroperitoneal adenopathy. Abdominal aorta is normal in caliber. Reproductive: Post hysterectomy with soft tissue edema of the pelvis. Minimal free fluid dependently but no peripherally enhancing fluid collection. Scattered foci of air in the pelvis and anterior abdominal wall, a normal finding post recent hysterectomy. Ovaries are not confidently identified. Bladder: Distended, almost to the level of the umbilicus. Other: Scattered free air in the anterior abdomen, an expected finding post recent abdominal surgery. Small amount of free fluid in the right pericolic gutter. Postsurgical change in the subcutaneous tissues anteriorly, no subcutaneous abscess. Musculoskeletal: There are no acute or suspicious osseous abnormalities. IMPRESSION: 1. Edema and free fluid in the pelvis, no rim enhancing fluid collection to suggest abscess post hysterectomy. Expected postsurgical air  within the abdomen. 2. Distended urinary bladder. 3. Moderate to large stool burden from the ascending through the descending colon. Electronically Signed   By: Jeb Levering M.D.   On: 12/27/2014 01:06   I have personally reviewed and evaluated these images and lab results as part of my medical decision-making.   EKG Interpretation None      MDM   Final diagnoses:  Post-operative pain  Constipation due to opioid therapy   49 year old female with a history of total abdominal hysterectomy for fibroids presents with concern for severe abdominal pain and constipation.  CT abdomen and pelvis ordered which showed no evidence of enhancing fluid, and showed extensive postoperative findings. Patient without leukocytosis on CBC, and is a afebrile the emergency department. CMP and lipase are within normal limits. Urinalysis shows no sign of infection. Constipation is most likely etiology of patient's severe abdominal pain. Discussed constipation management in ED and patient preference is for enema and milk and mollasses ordered in the ED.  Patient with BM in ED, improvement in pain. Recommend continued hydration, fiber, stool softener, miralax and follow up with GYN. Patient discharged in stable condition with understanding of reasons to return.    Gareth Morgan, MD 12/27/14 [phone]

## 2014-12-26 NOTE — ED Notes (Signed)
Bed: WA01 Expected date:  Expected time:  Means of arrival:  Comments: EMS 49 year old abdominal pain

## 2014-12-27 ENCOUNTER — Emergency Department (HOSPITAL_COMMUNITY): Payer: Medicaid Other

## 2014-12-27 LAB — URINE MICROSCOPIC-ADD ON

## 2014-12-27 LAB — URINALYSIS, ROUTINE W REFLEX MICROSCOPIC
BILIRUBIN URINE: NEGATIVE
Glucose, UA: NEGATIVE mg/dL
KETONES UR: NEGATIVE mg/dL
Leukocytes, UA: NEGATIVE
NITRITE: NEGATIVE
PH: 8 (ref 5.0–8.0)
Protein, ur: NEGATIVE mg/dL
Specific Gravity, Urine: 1.002 — ABNORMAL LOW (ref 1.005–1.030)

## 2014-12-27 LAB — URIC ACID: URIC ACID, SERUM: 2.9 mg/dL (ref 2.3–6.6)

## 2014-12-27 MED ORDER — IOHEXOL 300 MG/ML  SOLN
50.0000 mL | Freq: Once | INTRAMUSCULAR | Status: AC | PRN
  Administered 2014-12-27: 50 mL via ORAL

## 2014-12-27 MED ORDER — IOHEXOL 300 MG/ML  SOLN
100.0000 mL | Freq: Once | INTRAMUSCULAR | Status: AC | PRN
  Administered 2014-12-27: 100 mL via INTRAVENOUS

## 2014-12-27 MED ORDER — MAGNESIUM CITRATE PO SOLN: 1.0000 | Freq: Once | ORAL | Status: DC

## 2014-12-27 MED ORDER — MILK AND MOLASSES ENEMA
1.0000 | Freq: Once | RECTAL | Status: AC
  Administered 2014-12-27: 250 mL via RECTAL
  Filled 2014-12-27: qty 250

## 2014-12-27 MED ORDER — DOCUSATE SODIUM 100 MG PO CAPS: 100.0000 mg | ORAL_CAPSULE | Freq: Two times a day (BID) | ORAL | Status: DC

## 2014-12-28 LAB — URINE CULTURE

## 2014-12-29 ENCOUNTER — Telehealth: Payer: Self-pay | Admitting: *Deleted

## 2014-12-29 ENCOUNTER — Telehealth: Payer: Self-pay

## 2014-12-29 NOTE — Telephone Encounter (Signed)
Pt left messages on nurse voice mail on 12/16 @ 7:32pm, 7:38pm and 7:52pm. The clinic was closed at the time. All of her messages stated that she was in pain, lightheaded and her sx were getting worse. Pt reported having had surgery on 12/13. Per chart review, pt subsequently went to ED due to her pain and was treated for constipation.

## 2014-12-29 NOTE — Telephone Encounter (Signed)
Pt called in stating she is constipated from taking pain meds from having surgery. I advised patient that she can go get suppository to help with constipation. She was also informed to continued taking colace for constipation.

## 2014-12-30 ENCOUNTER — Inpatient Hospital Stay (HOSPITAL_COMMUNITY)
Admission: AD | Admit: 2014-12-30 | Discharge: 2014-12-30 | Disposition: A | Discharge status: Home / Self Care | Payer: Medicaid Other | Source: Ambulatory Visit | Attending: Obstetrics & Gynecology | Admitting: Obstetrics & Gynecology

## 2014-12-30 ENCOUNTER — Encounter (HOSPITAL_COMMUNITY): Payer: Self-pay | Admitting: *Deleted

## 2014-12-30 ENCOUNTER — Inpatient Hospital Stay (HOSPITAL_COMMUNITY): Payer: Medicaid Other

## 2014-12-30 DIAGNOSIS — R198 Other specified symptoms and signs involving the digestive system and abdomen: Secondary | ICD-10-CM | POA: Diagnosis present

## 2014-12-30 DIAGNOSIS — R109 Unspecified abdominal pain: Secondary | ICD-10-CM

## 2014-12-30 DIAGNOSIS — R194 Change in bowel habit: Secondary | ICD-10-CM | POA: Diagnosis not present

## 2014-12-30 DIAGNOSIS — Z90710 Acquired absence of both cervix and uterus: Secondary | ICD-10-CM | POA: Diagnosis not present

## 2014-12-30 MED ORDER — IBUPROFEN 600 MG PO TABS: 600.0000 mg | ORAL_TABLET | Freq: Four times a day (QID) | ORAL | Status: DC | PRN

## 2014-12-30 MED ORDER — IBUPROFEN 600 MG PO TABS
600.0000 mg | ORAL_TABLET | Freq: Once | ORAL | Status: AC
  Administered 2014-12-30: 600 mg via ORAL
  Filled 2014-12-30: qty 1

## 2014-12-30 NOTE — MAU Note (Addendum)
Arrived via EMS. S/P hysterectomy 12/13 by Dr. Roselie Awkward. C/O abdominal pain and constipation. Has tried miralax, colace and mag citrate. Now states she has some diarrhea X 3 and a lot of gas. Describes pain as "bubbly."C/O dizziness off and on.

## 2014-12-30 NOTE — Discharge Instructions (Signed)
Ileus ° Ileus is a condition in which the intestines, also called the bowels, stop working and moving correctly. If the intestines stop working, food cannot pass through to get digested. The intestines are hollow organs that digest food after the food leaves the stomach. These organs are long, muscular tubes that connect the stomach to the rectum. When ileus occurs, the muscular contractions that cause food to move through the intestines stop happening as they normally would. °Ileus can occur for various reasons. This condition is a serious problem that usually requires hospitalization. It can cause symptoms such as nausea, abdominal pain, and bloating. Ileus can last from a few hours to a few days. If the intestines stop working because of a blockage, that is a different condition that is called a bowel obstruction. °CAUSES °This condition may be caused by: °· Surgery on the abdomen. °· An infection or inflammation in the abdomen. This includes inflammation of the lining of the abdomen (peritonitis). °· Infection or inflammation in other parts of the body, such as pneumonia or pancreatitis. °· Passage of gallstones or kidney stones. °· Damage to the nerves or blood vessels that go to the intestines. °· A collection of blood within the abdominal cavity. °· Imbalance in the salts in the blood (electrolytes). °· Injury to the brain or spinal cord. °· Medicines. Many medicines, including strong pain medicines, can cause ileus or make it worse. °SYMPTOMS °Symptoms of this condition include: °· Bloating of the abdomen. °· Pain or discomfort in the abdomen. °· Poor appetite. °· Nausea and vomiting. °· Lack of normal bowel sounds, such as "growling" in the stomach. °DIAGNOSIS °This condition may be diagnosed with: °· A physical exam and medical history. °· X-rays or a CT scan of the abdomen. °You may also have other tests to help find the cause of the condition. °TREATMENT °Treatment for this condition may  include: °· Resting the intestines until they start to work again. This is often done by: °¨ Stopping oral intake of food and drink. You will be given fluid through an IV tube to prevent dehydration. °¨ Placing a small tube (nasogastric tube or NG tube) that is passed through your nose and into your stomach. The tube is attached to a suction device and keeps the stomach emptied out. This allows the bowels to rest and also helps to reduce nausea and vomiting. °· Correcting any electrolyte imbalance by giving supplements in the IV fluid. °· Stopping any medicines that might make ileus worse. °· Treating any condition that may have caused ileus. °HOME CARE INSTRUCTIONS °· Follow instructions from your health care provider about diet and fluid intake. Usually, you will be told to: °¨ Drink plenty of clear fluids. °¨ Avoid alcohol. °¨ Avoid caffeine. °¨ Eat a bland diet. °· Get plenty of rest. Return to your normal activities as told by your health care provider. °· Take over-the-counter and prescription medicines only as told by your health care provider. °· Keep all follow-up visits as told by your health care provider. This is important. °SEEK MEDICAL CARE IF: °· You have nausea, vomiting, or abdominal discomfort. °· You have a fever. °SEEK IMMEDIATE MEDICAL CARE IF: °· You have severe abdominal pain or bloating. °· You cannot eat or drink without vomiting. °  °This information is not intended to replace advice given to you by your health care provider. Make sure you discuss any questions you have with your health care provider. °  °Document Released: 12/30/2002 Document Revised: 09/17/2014 Document   Reviewed: 02/20/2014 °Elsevier Interactive Patient Education ©2016 [employer]. ° °

## 2014-12-30 NOTE — MAU Provider Note (Signed)
History     CSN: [id number]  Arrival date and time: 12/30/14 [id number]   First Provider Initiated Contact with Patient 12/30/14 [phone]      Chief Complaint  Patient presents with  . Abdominal Pain  . Constipation   HPI  Maria Logan 49 y.o. [id number] presents to MAU with bloating and abdominal pain after having a hysteretomy on 12/23/14. She had an uncomplicated surgery by Dr Roselie Awkward and has been taking percocet only since surgery. She states that she took miralax, 6 gas-x and colace and had a episode of diarrhea yesterday but continues to have bloating and pain. She denies n/v.  Past Medical History  Diagnosis Date  . Fibroids   . Vaginal delivery 1988, 1990, 2005    Past Surgical History  Procedure Laterality Date  . Examination under anesthesia N/A 06/04/2014    Procedure: EXAM UNDER ANESTHESIA;  Surgeon: Guss Bunde, MD;  Location: [facility];  Service: Gynecology;  Laterality: N/A;  . Operative ultrasound N/A 06/04/2014    Procedure: OPERATIVE ULTRASOUND;  Surgeon: Guss Bunde, MD;  Location: [facility];  Service: Gynecology;  Laterality: N/A;  . Abdominal hysterectomy N/A 12/23/2014    Procedure: HYSTERECTOMY ABDOMINAL;  Surgeon: Woodroe Mode, MD;  Location: [facility];  Service: Gynecology;  Laterality: N/A;  Requested 12/23/14 @ 1:00p  . Bilateral salpingectomy Bilateral 12/23/2014    Procedure: BILATERAL SALPINGECTOMY;  Surgeon: Woodroe Mode, MD;  Location: [facility];  Service: Gynecology;  Laterality: Bilateral;    Family History  Problem Relation Age of Onset  . Alcohol abuse Neg Hx   . Arthritis Neg Hx   . Asthma Neg Hx   . Birth defects Neg Hx   . Cancer Neg Hx   . COPD Neg Hx   . Depression Neg Hx   . Diabetes Neg Hx   . Drug abuse Neg Hx   . Early death Neg Hx   . Hearing loss Neg Hx   . Heart disease Neg Hx   . Hyperlipidemia Neg Hx   . Hypertension Neg Hx   . Kidney disease Neg Hx   . Learning disabilities Neg Hx   . Mental illness Neg Hx   . Mental  retardation Neg Hx   . Miscarriages / Stillbirths Neg Hx   . Stroke Neg Hx   . Vision loss Neg Hx   . Varicose Veins Neg Hx     Social History  Substance Use Topics  . Smoking status: Never Smoker   . Smokeless tobacco: Never Used  . Alcohol Use: No    Allergies: No Known Allergies  Prescriptions prior to admission  Medication Sig Dispense Refill Last Dose  . diclofenac sodium (VOLTAREN) 1 % GEL Apply 4 g topically 4 (four) times daily. (Patient taking differently: Apply 4 g topically daily as needed (knee pain). ) 100 g 1 unknown at unknown time  . docusate sodium (COLACE) 100 MG capsule Take 1 capsule (100 mg total) by mouth 2 (two) times daily. 10 capsule 0   . magnesium citrate SOLN Take 296 mLs (1 Bottle total) by mouth once. 195 mL 0   . oxyCODONE-acetaminophen (PERCOCET/ROXICET) 5-325 MG tablet Take 1-2 tablets by mouth every 4 (four) hours as needed for severe pain (moderate to severe pain (when tolerating fluids)). 30 tablet 0 12/26/2014 at Unknown time    Review of Systems  Constitutional: Negative for fever.  Gastrointestinal: Positive for abdominal pain, diarrhea and constipation. Negative for nausea and vomiting.  Physical Exam   Blood pressure 98/61, pulse 104, temperature 98.3 F (36.8 C), temperature source Oral, resp. rate 18, last menstrual period 12/09/2014, SpO2 100 %.  Physical Exam  Nursing note and vitals reviewed. Constitutional: She appears well-developed and well-nourished. No distress.  HENT:  Head: Normocephalic and atraumatic.  Cardiovascular: Normal rate.   Respiratory: Effort normal. No respiratory distress.  GI: Soft. She exhibits distension. Bowel sounds are increased. There is tenderness in the suprapubic area.  Skin: Skin is warm and dry.  Psychiatric: She has a normal mood and affect. Her behavior is normal. Judgment and thought content normal.   Dg Abd 2 Views  12/30/2014  CLINICAL DATA:  Status post hysterectomy 12/23/2014.  Incisional pain radiating into the mid abdomen and low back for 3 days. EXAM: ABDOMEN - 2 VIEW COMPARISON:  CT abdomen and pelvis 12/27/2014. FINDINGS: There is gaseous distention of the colon with air-fluid levels identified. No evidence of small bowel obstruction is seen. No free intraperitoneal air is identified. No abnormal abdominal calcification is seen. IMPRESSION: Gaseous distention of the colon may be due to ileus. Negative for free intraperitoneal air or small bowel obstruction. Electronically Signed   By: Inge Rise M.D.   On: 12/30/2014 09:41   MAU Course  Procedures  MDM X ray to r/o ileus ; bowel obstruction. Pt is not vomiting . Spoke with Dr Roselie Awkward and will instruct patient to not eat anything today . I f she is feeling worse or start vomiting she will need to come back to the hospital. Try to switch to ibuprofen instead of percocet for pain control. Follow up in office 4-6 weeks  Assessment and Plan  Altered Bowel function  Discharge   Maria Logan 12/30/2014, 9:15 AM

## 2014-12-31 ENCOUNTER — Telehealth: Payer: Self-pay | Admitting: General Practice

## 2014-12-31 NOTE — Telephone Encounter (Signed)
Patient called and left message stating she had surgery on 12/13. Patient states she has been back and forth to the ER. Patient states she is having a brown discharge with odor like what you have at the end of a period. Patient requesting callback. Called patient and she states at the discharge was occuring yesterday while she was here but she didn't say anything because she was in so much pain from the gas so she was focused on that. Patient states she is having a brown d/c with a horrible odor. Patient denies fever/chills. Offered patient to come in for an appt tomorrow with Dr Roselie Awkward at [facility]. Patient verbalized understanding and states she will be here then. Patient had no questions

## 2015-01-01 ENCOUNTER — Encounter (HOSPITAL_COMMUNITY): Payer: Self-pay | Admitting: *Deleted

## 2015-01-01 ENCOUNTER — Inpatient Hospital Stay (HOSPITAL_COMMUNITY)
Admission: AD | Admit: 2015-01-01 | Discharge: 2015-01-04 | DRG: 863 | Disposition: A | Discharge status: Home / Self Care | Payer: Medicaid Other | Source: Ambulatory Visit | Attending: Obstetrics & Gynecology | Admitting: Obstetrics & Gynecology

## 2015-01-01 ENCOUNTER — Encounter: Payer: Self-pay | Admitting: Obstetrics & Gynecology

## 2015-01-01 ENCOUNTER — Ambulatory Visit (INDEPENDENT_AMBULATORY_CARE_PROVIDER_SITE_OTHER): Payer: Medicaid Other | Admitting: Obstetrics & Gynecology

## 2015-01-01 VITALS — BP 72/48 | HR 100 | Temp 97.9°F | Ht 66.0 in | Wt 164.3 lb

## 2015-01-01 DIAGNOSIS — N898 Other specified noninflammatory disorders of vagina: Secondary | ICD-10-CM

## 2015-01-01 DIAGNOSIS — L03818 Cellulitis of other sites: Secondary | ICD-10-CM | POA: Diagnosis present

## 2015-01-01 DIAGNOSIS — T814XXA Infection following a procedure, initial encounter: Principal | ICD-10-CM | POA: Diagnosis present

## 2015-01-01 DIAGNOSIS — Z90710 Acquired absence of both cervix and uterus: Secondary | ICD-10-CM

## 2015-01-01 DIAGNOSIS — Z9079 Acquired absence of other genital organ(s): Secondary | ICD-10-CM

## 2015-01-01 DIAGNOSIS — N9489 Other specified conditions associated with female genital organs and menstrual cycle: Secondary | ICD-10-CM

## 2015-01-01 DIAGNOSIS — Y836 Removal of other organ (partial) (total) as the cause of abnormal reaction of the patient, or of later complication, without mention of misadventure at the time of the procedure: Secondary | ICD-10-CM | POA: Diagnosis present

## 2015-01-01 DIAGNOSIS — N760 Acute vaginitis: Secondary | ICD-10-CM | POA: Diagnosis present

## 2015-01-01 DIAGNOSIS — R109 Unspecified abdominal pain: Secondary | ICD-10-CM | POA: Diagnosis present

## 2015-01-01 DIAGNOSIS — N732 Unspecified parametritis and pelvic cellulitis: Secondary | ICD-10-CM | POA: Diagnosis not present

## 2015-01-01 LAB — COMPREHENSIVE METABOLIC PANEL
ALK PHOS: 289 U/L — AB (ref 38–126)
ALT: 65 U/L — ABNORMAL HIGH (ref 14–54)
ANION GAP: 12 (ref 5–15)
AST: 66 U/L — ABNORMAL HIGH (ref 15–41)
Albumin: 2.2 g/dL — ABNORMAL LOW (ref 3.5–5.0)
BILIRUBIN TOTAL: 1.6 mg/dL — AB (ref 0.3–1.2)
BUN: 7 mg/dL (ref 6–20)
CALCIUM: 8.2 mg/dL — AB (ref 8.9–10.3)
CO2: 27 mmol/L (ref 22–32)
Chloride: 97 mmol/L — ABNORMAL LOW (ref 101–111)
Creatinine, Ser: 0.74 mg/dL (ref 0.44–1.00)
GFR calc Af Amer: >60 mL/min (ref >60)
Glucose, Bld: 106 mg/dL — ABNORMAL HIGH (ref 65–99)
POTASSIUM: 2.8 mmol/L — AB (ref 3.5–5.1)
Sodium: 136 mmol/L (ref 135–145)
TOTAL PROTEIN: 6.2 g/dL — AB (ref 6.5–8.1)

## 2015-01-01 LAB — URINE MICROSCOPIC-ADD ON: BACTERIA UA: NONE SEEN

## 2015-01-01 LAB — CBC WITH DIFFERENTIAL/PLATELET
BASOS PCT: 0 %
BLASTS: 0 %
Band Neutrophils: 0 %
Basophils Absolute: 0 10*3/uL (ref 0.0–0.1)
Eosinophils Absolute: 0.3 10*3/uL (ref 0.0–0.7)
Eosinophils Relative: 2 %
HEMATOCRIT: 28.6 % — AB (ref 36.0–46.0)
HEMOGLOBIN: 9.7 g/dL — AB (ref 12.0–15.0)
LYMPHS PCT: 8 %
Lymphs Abs: 1.2 10*3/uL (ref 0.7–4.0)
MCH: 29.1 pg (ref 26.0–34.0)
MCHC: 33.9 g/dL (ref 30.0–36.0)
MCV: 85.9 fL (ref 78.0–100.0)
MYELOCYTES: 0 %
Metamyelocytes Relative: 0 %
Monocytes Absolute: 0.8 10*3/uL (ref 0.1–1.0)
Monocytes Relative: 5 %
NEUTROS PCT: 85 %
NRBC: 0 /100{WBCs}
Neutro Abs: 13.1 10*3/uL — ABNORMAL HIGH (ref 1.7–7.7)
OTHER: 0 %
PROMYELOCYTES ABS: 0 %
Platelets: 312 10*3/uL (ref 150–400)
RBC: 3.33 MIL/uL — AB (ref 3.87–5.11)
RDW: 13.2 % (ref 11.5–15.5)
WBC: 15.4 10*3/uL — AB (ref 4.0–10.5)

## 2015-01-01 LAB — URINALYSIS, ROUTINE W REFLEX MICROSCOPIC
GLUCOSE, UA: NEGATIVE mg/dL
Ketones, ur: 15 mg/dL — AB
Nitrite: NEGATIVE
Protein, ur: 30 mg/dL — AB
Specific Gravity, Urine: <1.005 — ABNORMAL LOW (ref 1.005–1.030)
pH: 6 (ref 5.0–8.0)

## 2015-01-01 MED ORDER — GENTAMICIN SULFATE 40 MG/ML IJ SOLN
450.0000 mg | INTRAVENOUS | Status: DC
  Administered 2015-01-01 – 2015-01-03 (×3): 450 mg via INTRAVENOUS
  Filled 2015-01-01 (×4): qty 11.25

## 2015-01-01 MED ORDER — ONDANSETRON HCL 4 MG PO TABS: 4.0000 mg | ORAL_TABLET | Freq: Four times a day (QID) | ORAL | Status: DC | PRN

## 2015-01-01 MED ORDER — LACTATED RINGERS IV SOLN
INTRAVENOUS | Status: DC
  Administered 2015-01-01: 15:00:00 via INTRAVENOUS

## 2015-01-01 MED ORDER — PRENATAL MULTIVITAMIN CH
1.0000 | ORAL_TABLET | Freq: Every day | ORAL | Status: DC
  Administered 2015-01-03: 1 via ORAL
  Filled 2015-01-01: qty 1

## 2015-01-01 MED ORDER — CLINDAMYCIN PHOSPHATE 900 MG/50ML IV SOLN
900.0000 mg | Freq: Three times a day (TID) | INTRAVENOUS | Status: DC
  Administered 2015-01-01 – 2015-01-04 (×9): 900 mg via INTRAVENOUS
  Filled 2015-01-01 (×11): qty 50

## 2015-01-01 MED ORDER — POTASSIUM CHLORIDE CRYS ER 20 MEQ PO TBCR
20.0000 meq | EXTENDED_RELEASE_TABLET | Freq: Two times a day (BID) | ORAL | Status: DC
  Administered 2015-01-01: 20 meq via ORAL
  Filled 2015-01-01 (×2): qty 1

## 2015-01-01 MED ORDER — POTASSIUM CHLORIDE 2 MEQ/ML IV SOLN
INTRAVENOUS | Status: DC
  Administered 2015-01-01 – 2015-01-02 (×3): via INTRAVENOUS
  Filled 2015-01-01 (×8): qty 1000

## 2015-01-01 MED ORDER — ZOLPIDEM TARTRATE 5 MG PO TABS
5.0000 mg | ORAL_TABLET | Freq: Every evening | ORAL | Status: DC | PRN
  Administered 2015-01-01: 5 mg via ORAL
  Filled 2015-01-01: qty 1

## 2015-01-01 MED ORDER — HYDROMORPHONE HCL 1 MG/ML IJ SOLN
0.2000 mg | INTRAMUSCULAR | Status: DC | PRN
  Administered 2015-01-01: 0.5 mg via INTRAVENOUS
  Administered 2015-01-01: 0.6 mg via INTRAVENOUS
  Administered 2015-01-01: 0.4 mg via INTRAVENOUS
  Administered 2015-01-02 (×2): 0.6 mg via INTRAVENOUS
  Filled 2015-01-01 (×5): qty 1

## 2015-01-01 MED ORDER — OXYCODONE-ACETAMINOPHEN 5-325 MG PO TABS
1.0000 | ORAL_TABLET | ORAL | Status: DC | PRN
  Administered 2015-01-02 (×2): 2 via ORAL
  Administered 2015-01-03: 1 via ORAL
  Filled 2015-01-01 (×2): qty 2
  Filled 2015-01-01: qty 1

## 2015-01-01 MED ORDER — ONDANSETRON HCL 4 MG/2ML IJ SOLN: 4.0000 mg | Freq: Four times a day (QID) | INTRAMUSCULAR | Status: DC | PRN

## 2015-01-01 NOTE — H&P (Signed)
Patient ID: Maria Logan, female DOB: [birth year], 49 y.o. MRN: [id number]  Chief Complaint  Patient presents with  . Vaginal odor  postop from TAH 12/23/14, low abdominal pain  HPI Maria Logan is a 49 y.o. female. [name] Patient's last menstrual period was 12/09/2014 (approximate). S/P TAH/BS for large fibroid [id number] with no complications, has had increased pain since 12/17 and was seen in ED and then MAU 12/19, feels bloated, has flatus and was constipated, decreased appetite. No fever but sweats and chills. CT was unremarkable and abd film showed increased colon distention.  HPI  Past Medical History  Diagnosis Date  . Fibroids   . Vaginal delivery 1988, 1990, 2005    Past Surgical History  Procedure Laterality Date  . Examination under anesthesia N/A 06/04/2014    Procedure: EXAM UNDER ANESTHESIA; Surgeon: Guss Bunde, MD; Location: [facility]; Service: Gynecology; Laterality: N/A;  . Operative ultrasound N/A 06/04/2014    Procedure: OPERATIVE ULTRASOUND; Surgeon: Guss Bunde, MD; Location: [facility]; Service: Gynecology; Laterality: N/A;  . Abdominal hysterectomy N/A 12/23/2014    Procedure: HYSTERECTOMY ABDOMINAL; Surgeon: Woodroe Mode, MD; Location: [facility]; Service: Gynecology; Laterality: N/A; Requested 12/23/14 @ 1:00p  . Bilateral salpingectomy Bilateral 12/23/2014    Procedure: BILATERAL SALPINGECTOMY; Surgeon: Woodroe Mode, MD; Location: [facility]; Service: Gynecology; Laterality: Bilateral;    Family History  Problem Relation Age of Onset  . Alcohol abuse Neg Hx   . Arthritis Neg Hx   . Asthma Neg Hx   . Birth defects Neg Hx   . Cancer Neg Hx   . COPD Neg Hx   . Depression Neg Hx   . Diabetes Neg Hx   . Drug abuse Neg Hx   . Early death Neg Hx   . Hearing loss Neg Hx   . Heart disease Neg Hx   . Hyperlipidemia Neg Hx   .  Hypertension Neg Hx   . Kidney disease Neg Hx   . Learning disabilities Neg Hx   . Mental illness Neg Hx   . Mental retardation Neg Hx   . Miscarriages / Stillbirths Neg Hx   . Stroke Neg Hx   . Vision loss Neg Hx   . Varicose Veins Neg Hx     Social History Social History  Substance Use Topics  . Smoking status: Never Smoker   . Smokeless tobacco: Never Used  . Alcohol Use: No    No Known Allergies  Current Outpatient Prescriptions  Medication Sig Dispense Refill  . docusate sodium (COLACE) 100 MG capsule Take 1 capsule (100 mg total) by mouth 2 (two) times daily. 10 capsule 0  . ibuprofen (ADVIL,MOTRIN) 600 MG tablet Take 1 tablet (600 mg total) by mouth every 6 (six) hours as needed for moderate pain or cramping. 30 tablet 0  . diclofenac sodium (VOLTAREN) 1 % GEL Apply 4 g topically 4 (four) times daily. (Patient not taking: Reported on 12/30/2014) 100 g 1  . simethicone (GAS-X) 80 MG chewable tablet Chew 320 mg by mouth daily as needed for flatulence. Reported on 01/01/2015     No current facility-administered medications for this visit.    Review of Systems Review of Systems  Constitutional: Positive for appetite change.  Gastrointestinal: Positive for abdominal pain, diarrhea (few days ago) and abdominal distention. Negative for nausea and vomiting.  Genitourinary: Positive for vaginal discharge (foul smell and copious) and pelvic pain. Negative for vaginal bleeding.    Blood pressure 72/48, pulse 100, temperature  97.9 F (36.6 C), temperature source Oral, height 5\' 6"  (1.676 m), weight 164 lb 4.8 oz (74.526 kg), last menstrual period 12/09/2014.  Physical Exam Physical Exam  Constitutional: She is oriented to person, place, and time. She appears well-developed. She appears distressed.  Pulmonary/Chest: Effort normal. No respiratory distress. Clear to auscultation Abdominal: She exhibits  distension. Active BS She exhibits no mass. There is tenderness (no guarding).  Genitourinary: Vaginal discharge (copious foul grey-brown ) found.  Cuff intact but tender  Neurological: She is alert and oriented to person, place, and time.  Skin: Skin is warm and dry.  Psychiatric: She has a normal mood and affect.    Data Reviewed  CLINICAL DATA: Severe lower abdominal pain. Hysterectomy three days prior 12/23/2014  EXAM: CT ABDOMEN AND PELVIS WITH CONTRAST  TECHNIQUE: Multidetector CT imaging of the abdomen and pelvis was performed using the standard protocol following bolus administration of intravenous contrast.  CONTRAST: 79mL OMNIPAQUE IOHEXOL 300 MG/ML SOLN, 140mL OMNIPAQUE IOHEXOL 300 MG/ML SOLN  COMPARISON: Preoperative MRI 05/15/2014  FINDINGS: Lower chest: Minimal dependent atelectasis at the lung bases.  Liver: No focal lesion.  Hepatobiliary: Gallbladder physiologically distended, no biliary dilatation.  Pancreas: No ductal dilatation or inflammation.  Spleen: Normal.  Adrenal glands: No nodule.  Kidneys: Symmetric renal enhancement and excretion. No hydronephrosis. Bilateral renal cysts, largest measuring 3.1 cm in the lower left kidney. No perinephric stranding.  Stomach/Bowel: Stomach physiologically distended. There are no dilated or thickened small bowel loops. Moderate to large stool burden in the ascending, transverse, and proximal descending colon, the distal most colon is decompressed. The appendix is tentatively identified and normal.  Vascular/Lymphatic: No retroperitoneal adenopathy. Abdominal aorta is normal in caliber.  Reproductive: Post hysterectomy with soft tissue edema of the pelvis. Minimal free fluid dependently but no peripherally enhancing fluid collection. Scattered foci of air in the pelvis and anterior abdominal wall, a normal finding post recent hysterectomy. Ovaries are not confidently  identified.  Bladder: Distended, almost to the level of the umbilicus.  Other: Scattered free air in the anterior abdomen, an expected finding post recent abdominal surgery. Small amount of free fluid in the right pericolic gutter. Postsurgical change in the subcutaneous tissues anteriorly, no subcutaneous abscess.  Musculoskeletal: There are no acute or suspicious osseous abnormalities.  IMPRESSION: 1. Edema and free fluid in the pelvis, no rim enhancing fluid collection to suggest abscess post hysterectomy. Expected postsurgical air within the abdomen. 2. Distended urinary bladder. 3. Moderate to large stool burden from the ascending through the descending colon.   Electronically Signed  By: Jeb Levering M.D.  On: 12/27/2014 01:06           CMP     Component Value Date/Time   NA 136 01/01/2015 1505   K 2.8* 01/01/2015 1505   CL 97* 01/01/2015 1505   CO2 27 01/01/2015 1505   GLUCOSE 106* 01/01/2015 1505   BUN 7 01/01/2015 1505   CREATININE 0.74 01/01/2015 1505   CREATININE 0.84 07/16/2014 1544   CALCIUM 8.2* 01/01/2015 1505   PROT 6.2* 01/01/2015 1505   ALBUMIN 2.2* 01/01/2015 1505   AST 66* 01/01/2015 1505   ALT 65* 01/01/2015 1505   ALKPHOS 289* 01/01/2015 1505   BILITOT 1.6* 01/01/2015 1505   GFRNONAA >60 01/01/2015 1505   GFRAA >60 01/01/2015 1505    CBC    Component Value Date/Time   WBC 15.4* 01/01/2015 1505   RBC 3.33* 01/01/2015 1505   HGB 9.7* 01/01/2015 1505   HCT 28.6*  01/01/2015 1505   PLT 312 01/01/2015 1505   MCV 85.9 01/01/2015 1505   MCH 29.1 01/01/2015 1505   MCHC 33.9 01/01/2015 1505   RDW 13.2 01/01/2015 1505   LYMPHSABS PENDING 01/01/2015 1505   MONOABS PENDING 01/01/2015 1505   EOSABS PENDING 01/01/2015 1505   BASOSABS PENDING 01/01/2015 1505     Assessment    Postoperative vaginal cuff infection Possible mild colonic ileus     Plan    Admit for IV antibiotics, gentamycin and clindamycin Pain  management  Clear liquid diet        ARNOLD,JAMES 01/01/2015, 1:49 PM

## 2015-01-01 NOTE — Progress Notes (Signed)
Patient ID: Maria Logan, female   DOB: 05-Jan-1966, 49 y.o.   MRN: [id number]  Chief Complaint  Patient presents with  . Vaginal odor  postop from TAH 12/23/14, low abdominal pain  HPI Maria Logan is a 49 y.o. female.  [id number] Patient's last menstrual period was 12/09/2014 (approximate).  S/P TAH/BS for large fibroid [id number] with no complications, has had increased pain since 12/17 and was seen in ED and then MAU 12/19, feels bloated, has flatus and was constipated, decreased appetite. No fever but sweats and chills. CT was unremarkable and abd film showed increased colon distention.  HPI  Past Medical History  Diagnosis Date  . Fibroids   . Vaginal delivery 1988, 1990, 2005    Past Surgical History  Procedure Laterality Date  . Examination under anesthesia N/A 06/04/2014    Procedure: EXAM UNDER ANESTHESIA;  Surgeon: Guss Bunde, MD;  Location: [facility];  Service: Gynecology;  Laterality: N/A;  . Operative ultrasound N/A 06/04/2014    Procedure: OPERATIVE ULTRASOUND;  Surgeon: Guss Bunde, MD;  Location: [facility];  Service: Gynecology;  Laterality: N/A;  . Abdominal hysterectomy N/A 12/23/2014    Procedure: HYSTERECTOMY ABDOMINAL;  Surgeon: Woodroe Mode, MD;  Location: [facility];  Service: Gynecology;  Laterality: N/A;  Requested 12/23/14 @ 1:00p  . Bilateral salpingectomy Bilateral 12/23/2014    Procedure: BILATERAL SALPINGECTOMY;  Surgeon: Woodroe Mode, MD;  Location: [facility];  Service: Gynecology;  Laterality: Bilateral;    Family History  Problem Relation Age of Onset  . Alcohol abuse Neg Hx   . Arthritis Neg Hx   . Asthma Neg Hx   . Birth defects Neg Hx   . Cancer Neg Hx   . COPD Neg Hx   . Depression Neg Hx   . Diabetes Neg Hx   . Drug abuse Neg Hx   . Early death Neg Hx   . Hearing loss Neg Hx   . Heart disease Neg Hx   . Hyperlipidemia Neg Hx   . Hypertension Neg Hx   . Kidney disease Neg Hx   . Learning disabilities Neg Hx   . Mental illness Neg Hx    . Mental retardation Neg Hx   . Miscarriages / Stillbirths Neg Hx   . Stroke Neg Hx   . Vision loss Neg Hx   . Varicose Veins Neg Hx     Social History Social History  Substance Use Topics  . Smoking status: Never Smoker   . Smokeless tobacco: Never Used  . Alcohol Use: No    No Known Allergies  Current Outpatient Prescriptions  Medication Sig Dispense Refill  . docusate sodium (COLACE) 100 MG capsule Take 1 capsule (100 mg total) by mouth 2 (two) times daily. 10 capsule 0  . ibuprofen (ADVIL,MOTRIN) 600 MG tablet Take 1 tablet (600 mg total) by mouth every 6 (six) hours as needed for moderate pain or cramping. 30 tablet 0  . diclofenac sodium (VOLTAREN) 1 % GEL Apply 4 g topically 4 (four) times daily. (Patient not taking: Reported on 12/30/2014) 100 g 1  . simethicone (GAS-X) 80 MG chewable tablet Chew 320 mg by mouth daily as needed for flatulence. Reported on 01/01/2015     No current facility-administered medications for this visit.    Review of Systems Review of Systems  Constitutional: Positive for appetite change.  Gastrointestinal: Positive for abdominal pain, diarrhea (few days ago) and abdominal distention. Negative for nausea and vomiting.  Genitourinary: Positive  for vaginal discharge (foul smell and copious) and pelvic pain. Negative for vaginal bleeding.    Blood pressure 72/48, pulse 100, temperature 97.9 F (36.6 C), temperature source Oral, height 5\' 6"  (1.676 m), weight 164 lb 4.8 oz (74.526 kg), last menstrual period 12/09/2014.  Physical Exam Physical Exam  Constitutional: She is oriented to person, place, and time. She appears well-developed. She appears distressed.  Pulmonary/Chest: Effort normal. No respiratory distress.  Abdominal: She exhibits distension. She exhibits no mass. There is tenderness (no guarding).  Genitourinary: Vaginal discharge (copious foul grey-brown ) found.  Cuff intact but tender  Neurological: She is alert and oriented to  person, place, and time.  Skin: Skin is warm and dry.  Psychiatric: She has a normal mood and affect.    Data Reviewed  CLINICAL DATA: Severe lower abdominal pain. Hysterectomy three days prior 12/23/2014  EXAM: CT ABDOMEN AND PELVIS WITH CONTRAST  TECHNIQUE: Multidetector CT imaging of the abdomen and pelvis was performed using the standard protocol following bolus administration of intravenous contrast.  CONTRAST: 61mL OMNIPAQUE IOHEXOL 300 MG/ML SOLN, 165mL OMNIPAQUE IOHEXOL 300 MG/ML SOLN  COMPARISON: Preoperative MRI 05/15/2014  FINDINGS: Lower chest: Minimal dependent atelectasis at the lung bases.  Liver: No focal lesion.  Hepatobiliary: Gallbladder physiologically distended, no biliary dilatation.  Pancreas: No ductal dilatation or inflammation.  Spleen: Normal.  Adrenal glands: No nodule.  Kidneys: Symmetric renal enhancement and excretion. No hydronephrosis. Bilateral renal cysts, largest measuring 3.1 cm in the lower left kidney. No perinephric stranding.  Stomach/Bowel: Stomach physiologically distended. There are no dilated or thickened small bowel loops. Moderate to large stool burden in the ascending, transverse, and proximal descending colon, the distal most colon is decompressed. The appendix is tentatively identified and normal.  Vascular/Lymphatic: No retroperitoneal adenopathy. Abdominal aorta is normal in caliber.  Reproductive: Post hysterectomy with soft tissue edema of the pelvis. Minimal free fluid dependently but no peripherally enhancing fluid collection. Scattered foci of air in the pelvis and anterior abdominal wall, a normal finding post recent hysterectomy. Ovaries are not confidently identified.  Bladder: Distended, almost to the level of the umbilicus.  Other: Scattered free air in the anterior abdomen, an expected finding post recent abdominal surgery. Small amount of free fluid in the right pericolic  gutter. Postsurgical change in the subcutaneous tissues anteriorly, no subcutaneous abscess.  Musculoskeletal: There are no acute or suspicious osseous abnormalities.  IMPRESSION: 1. Edema and free fluid in the pelvis, no rim enhancing fluid collection to suggest abscess post hysterectomy. Expected postsurgical air within the abdomen. 2. Distended urinary bladder. 3. Moderate to large stool burden from the ascending through the descending colon.   Electronically Signed  By: Jeb Levering M.D.  On: 12/27/2014 01:06             Assessment    Postoperative vaginal cuff infection Possible mild colonic ileus     Plan    Admit for IV antibiotics, gentamycin and clindamycin Pain management  Clear liquid diet         Shermika Balthaser 01/01/2015, 1:49 PM

## 2015-01-01 NOTE — Progress Notes (Signed)
ANTIBIOTIC CONSULT NOTE - INITIAL  Pharmacy Consult for Gentamicin/ will start extended interval dosing Indication: Wound infection  No Known Allergies  Patient Measurements: Height: 5\' 6"  (167.6 cm) Weight: 165 lb (74.844 kg) IBW/kg (Calculated) : 59.3 Adjusted Body Weight: 64 kg  Vital Signs: Temp: 97.8 F (36.6 C) (12/22 1358) Temp Source: Oral (12/22 1358) BP: 112/73 mmHg (12/22 1358) Pulse Rate: 85 (12/22 1358)     Labs: No results for input(s): WBC, HGB, PLT, LABCREA, CREATININE in the last 72 hours. Estimated Creatinine Clearance: 80.8 mL/min (by C-G formula based on Cr of 0.89).  Microbiology: Recent Results (from the past 720 hour(s))  Urine culture     Status: None   Collection Time: 12/26/14 11:52 PM  Result Value Ref Range Status   Specimen Description URINE, CLEAN CATCH  Final   Special Requests NONE  Final   Culture   Final    2,000 COLONIES/mL INSIGNIFICANT GROWTH Performed at [facility]    Report Status 12/28/2014 FINAL  Final    Medical History: Past Medical History  Diagnosis Date  . Fibroids   . Vaginal delivery 1988, 1990, 2005    Medications: Clindamycin 900 mg IV Q8 >> 01/01/15  Assessment: Pt had hysterectomy & bilateral salpingectomy 12/23/14.  Has c/o during last few days of gas pain, brown vaginal discharge with strong odor.  Denies fever/chills.   Goal of Therapy: Follow [place] Extended interval dosing nomogram.  Trough <4 mg/L after 10 hrs.  Plan: Begin Gentamicin 450mg  IV Q24 hours.   Hovey-Rankin, Maria Logan 01/01/2015,3:03 PM

## 2015-01-01 NOTE — Patient Instructions (Signed)
Abdominal Hysterectomy, Care After °Refer to this sheet in the next few weeks. These instructions provide you with information on caring for yourself after your procedure. Your health care provider may also give you more specific instructions. Your treatment has been planned according to current medical practices, but problems sometimes occur. Call your health care provider if you have any problems or questions after your procedure.  °WHAT TO EXPECT AFTER THE PROCEDURE °After your procedure, it is typical to have the following: °· Pain. °· Feeling tired. °· Poor appetite. °· Less interest in sex. °It takes 4-6 weeks to recover from this surgery.  °HOME CARE INSTRUCTIONS  °· Take pain medicines only as directed by your health care provider. Do not take over-the-counter pain medicines without checking with your health care provider first.  °· Change your bandage as directed by your health care provider. °· Return to your health care provider to have your sutures taken out. °· Take showers instead of baths for 2-3 weeks. Ask your health care provider when it is safe to start showering.  °· Do not douche, use tampons, or have sexual intercourse for at least 6 weeks or until your health care provider says you can.   °· Follow your health care provider's advice about exercise, lifting, driving, and general activities. °· Get plenty of rest and sleep.   °· Do not lift anything heavier than a gallon of milk (about 10 lb [4.5 kg]) for the first month after surgery. °· You can resume your normal diet if your health care provider says it is okay.   °· Do not drink alcohol until your health care provider says you can.   °· If you are constipated, ask your health care provider if you can take a mild laxative. °· Eating foods high in fiber may also help with constipation. Eat plenty of raw fruits and vegetables, whole grains, and beans. °· Drink enough fluids to keep your urine clear or pale yellow.   °· Try to have someone at  home with you for the first 1-2 weeks to help around the house. °· Keep all follow-up appointments. °SEEK MEDICAL CARE IF:  °· You have chills or fever. °· You have swelling, redness, or pain in the area of your incision that is getting worse.   °· You have pus coming from the incision.   °· You notice a bad smell coming from the incision or bandage.   °· Your incision breaks open.   °· You feel dizzy or light-headed.   °· You have pain or bleeding when you urinate.   °· You have persistent diarrhea.   °· You have persistent nausea and vomiting.   °· You have abnormal vaginal discharge.   °· You have a rash.   °· You have any type of abnormal reaction or develop an allergy to your medicine.   °· Your pain medicine is not helping.   °SEEK IMMEDIATE MEDICAL CARE IF:  °· You have a fever and your symptoms suddenly get worse. °· You have severe abdominal pain. °· You have chest pain. °· You have shortness of breath. °· You faint. °· You have pain, swelling, or redness of your leg. °· You have heavy vaginal bleeding with blood clots. °MAKE SURE YOU: °· Understand these instructions. °· Will watch your condition. °· Will get help right away if you are not doing well or get worse. °  °This information is not intended to replace advice given to you by your health care provider. Make sure you discuss any questions you have with your health care provider. °  °Document   Released: 07/16/2004 Document Revised: 01/17/2014 Document Reviewed: 10/19/2012 °Elsevier Interactive Patient Education ©2016 [employer] ° °

## 2015-01-02 LAB — COMPREHENSIVE METABOLIC PANEL
ALBUMIN: 2.2 g/dL — AB (ref 3.5–5.0)
ALT: 50 U/L (ref 14–54)
AST: 34 U/L (ref 15–41)
Alkaline Phosphatase: 226 U/L — ABNORMAL HIGH (ref 38–126)
Anion gap: 9 (ref 5–15)
BUN: 8 mg/dL (ref 6–20)
CHLORIDE: 101 mmol/L (ref 101–111)
CO2: 27 mmol/L (ref 22–32)
Calcium: 8.4 mg/dL — ABNORMAL LOW (ref 8.9–10.3)
Creatinine, Ser: 0.89 mg/dL (ref 0.44–1.00)
GFR calc Af Amer: >60 mL/min (ref >60)
GFR calc non Af Amer: >60 mL/min (ref >60)
GLUCOSE: 100 mg/dL — AB (ref 65–99)
POTASSIUM: 4.2 mmol/L (ref 3.5–5.1)
SODIUM: 137 mmol/L (ref 135–145)
Total Bilirubin: 1.2 mg/dL (ref 0.3–1.2)
Total Protein: 6.5 g/dL (ref 6.5–8.1)

## 2015-01-02 LAB — CBC WITH DIFFERENTIAL/PLATELET
BASOS PCT: 1 %
Basophils Absolute: 0.1 10*3/uL (ref 0.0–0.1)
EOS PCT: 0 %
Eosinophils Absolute: 0 10*3/uL (ref 0.0–0.7)
HEMATOCRIT: 25.7 % — AB (ref 36.0–46.0)
HEMOGLOBIN: 8.7 g/dL — AB (ref 12.0–15.0)
Lymphocytes Relative: 17 %
Lymphs Abs: 2.4 10*3/uL (ref 0.7–4.0)
MCH: 29.1 pg (ref 26.0–34.0)
MCHC: 33.9 g/dL (ref 30.0–36.0)
MCV: 86 fL (ref 78.0–100.0)
MONOS PCT: 5 %
Monocytes Absolute: 0.7 10*3/uL (ref 0.1–1.0)
NEUTROS ABS: 10.7 10*3/uL — AB (ref 1.7–7.7)
Neutrophils Relative %: 77 %
OTHER: 0 %
Platelets: 340 10*3/uL (ref 150–400)
RBC: 2.99 MIL/uL — AB (ref 3.87–5.11)
RDW: 13.4 % (ref 11.5–15.5)
WBC: 13.9 10*3/uL — AB (ref 4.0–10.5)

## 2015-01-02 MED ORDER — IBUPROFEN 600 MG PO TABS
600.0000 mg | ORAL_TABLET | Freq: Four times a day (QID) | ORAL | Status: DC | PRN
  Administered 2015-01-03 – 2015-01-04 (×3): 600 mg via ORAL
  Filled 2015-01-02 (×3): qty 1

## 2015-01-02 MED ORDER — BISACODYL 10 MG RE SUPP
10.0000 mg | Freq: Every day | RECTAL | Status: DC | PRN
  Administered 2015-01-02 – 2015-01-03 (×2): 10 mg via RECTAL
  Filled 2015-01-02 (×2): qty 1

## 2015-01-02 MED ORDER — LACTATED RINGERS IV SOLN
INTRAVENOUS | Status: DC
  Administered 2015-01-02: 10:00:00 via INTRAVENOUS

## 2015-01-02 MED ORDER — FLEET ENEMA 7-19 GM/118ML RE ENEM: 1.0000 | ENEMA | Freq: Every day | RECTAL | Status: DC | PRN

## 2015-01-02 NOTE — Progress Notes (Signed)
POD 10 TAH/BS Postop infection  Subjective: Patient reports tolerating PO and + flatus.  Decreased appetite, still has vaginal drainage  Objective: I have reviewed patient's vital signs, intake and output, medications and labs.  General: alert, cooperative and mild distress GI: soft, non-tender; bowel sounds normal; no masses,  no organomegaly Extremities: extremities normal, atraumatic, no cyanosis or edema CBC    Component Value Date/Time   WBC 13.9* 01/02/2015 0544   RBC 2.99* 01/02/2015 0544   HGB 8.7* 01/02/2015 0544   HCT 25.7* 01/02/2015 0544   PLT 340 01/02/2015 0544   MCV 86.0 01/02/2015 0544   MCH 29.1 01/02/2015 0544   MCHC 33.9 01/02/2015 0544   RDW 13.4 01/02/2015 0544   LYMPHSABS 2.4 01/02/2015 0544   MONOABS 0.7 01/02/2015 0544   EOSABS 0.0 01/02/2015 0544   BASOSABS 0.1 01/02/2015 0544   CMP Latest Ref Rng 01/02/2015 01/01/2015 12/26/2014  Glucose 65 - 99 mg/dL 100(H) 106(H) 118(H)  BUN 6 - 20 mg/dL 8 7 5(L)  Creatinine 0.44 - 1.00 mg/dL 0.89 0.74 0.89  Sodium 135 - 145 mmol/L 137 136 139  Potassium 3.5 - 5.1 mmol/L 4.2 2.8(L) 3.2(L)  Chloride 101 - 111 mmol/L 101 97(L) 104  CO2 22 - 32 mmol/L 27 27 26   Calcium 8.9 - 10.3 mg/dL 8.4(L) 8.2(L) 9.2  Total Protein 6.5 - 8.1 g/dL 6.5 6.2(L) 6.9  Total Bilirubin 0.3 - 1.2 mg/dL 1.2 1.6(H) 0.8  Alkaline Phos 38 - 126 U/L 226(H) 289(H) 53  AST 15 - 41 U/L 34 66(H) 20  ALT 14 - 54 U/L 50 65(H) 16      Assessment: S/p TAH with cuff infection  Plan: Encourage ambulation Continue ABX therapy due to Post-op infection  LOS: 1 day    ARNOLD,JAMES 01/02/2015, 8:13 AM

## 2015-01-03 MED ORDER — OXYCODONE-ACETAMINOPHEN 5-325 MG PO TABS: 1.0000 | ORAL_TABLET | ORAL | Status: DC | PRN

## 2015-01-03 MED ORDER — METRONIDAZOLE 500 MG PO TABS: 500.0000 mg | ORAL_TABLET | Freq: Two times a day (BID) | ORAL | Status: DC

## 2015-01-03 MED ORDER — DOXYCYCLINE HYCLATE 100 MG PO CAPS: 100.0000 mg | ORAL_CAPSULE | Freq: Two times a day (BID) | ORAL | Status: DC

## 2015-01-03 NOTE — Progress Notes (Signed)
POD#11 s/p TAH with bilateral salpingectomy   Subjective: Patient reports slowly feeling better. She is tolerating a regular diet. She is passing flatus. She denies nausea/emesis. She reports persistent vaginal discharge but significantly less than on admission    Objective: I have reviewed patient's vital signs, medications and labs. Blood pressure 96/47, pulse 79, temperature 98.4 F (36.9 C), temperature source Oral, resp. rate 18, height 5\' 6"  (1.676 m), weight 165 lb (74.844 kg), last menstrual period 12/09/2014, SpO2 100 %.  General: alert, cooperative and no distress Resp: clear to auscultation bilaterally Cardio: regular rate and rhythm GI: soft, non-tender; bowel sounds normal; no masses,  no organomegaly Extremities: no edema, redness or tenderness in the calves or thighs  Assessment: s/p TAH/bilateral salpingectomy readmitted with a vaginal cuff infection: stable  Plan: Encourage ambulation  Continue parenteral antibiotics Plan for discharge tomorrow Continue current care  LOS: 2 days    Abdimalik Mayorquin 01/03/2015, 8:51 AM

## 2015-01-03 NOTE — Discharge Summary (Signed)
Physician Discharge Summary  Patient ID: Maria Logan MRN: [id number] DOB/AGE: September 30, 1965 49 y.o.  Admit date: 01/01/2015 Discharge date: 01/04/2015  Admission Diagnoses: vaginal cuff infection  Discharge Diagnoses:  Active Problems:   Vaginal cuff cellulitis   Discharged Condition: good  Hospital Course: Patient admitted secondary to the presence of a purulent vaginal discharge consistent with a vaginal cuff infection. She received IV antibiotics for a little more that 48 hours. She remained afebrile throughout her stay. Her vaginal drainage decreased over the course of her admission. She tolerated oral intake, passed flatus and ambulated without complications. Discharge instructions were provided. Patient is to follow up on 01/21/2014 following the 14-day course of antibiotics  Consults: None  Significant Diagnostic Studies: labs: WBC 15.4 -->13.9  Treatments: antibiotics: gentamycin and clindamycin  Discharge Exam: Blood pressure 94/63, pulse 80, temperature 98.1 F (36.7 C), temperature source Oral, resp. rate 18, height 5\' 6"  (1.676 m), weight 165 lb (74.844 kg), last menstrual period 12/09/2014, SpO2 100 %. General appearance: alert, cooperative and no distress Resp: clear to auscultation bilaterally Cardio: regular rate and rhythm GI: soft, non-tender; bowel sounds normal; no masses,  no organomegaly Extremities: extremities normal, atraumatic, no cyanosis or edema and no edema, redness or tenderness in the calves or thighs Incision/Wound: no erythema, induration or drainage  Disposition: 01-Home or Self Care     Medication List    TAKE these medications        docusate sodium 100 MG capsule  Commonly known as:  COLACE  Take 1 capsule (100 mg total) by mouth 2 (two) times daily.     doxycycline 100 MG capsule  Commonly known as:  VIBRAMYCIN  Take 1 capsule (100 mg total) by mouth 2 (two) times daily.     ibuprofen 600 MG tablet  Commonly known as:   ADVIL,MOTRIN  Take 1 tablet (600 mg total) by mouth every 6 (six) hours as needed for moderate pain or cramping.     metroNIDAZOLE 500 MG tablet  Commonly known as:  FLAGYL  Take 1 tablet (500 mg total) by mouth 2 (two) times daily.     oxyCODONE-acetaminophen 5-325 MG tablet  Commonly known as:  PERCOCET/ROXICET  Take 1-2 tablets by mouth every 3 (three) hours as needed (moderate to severe pain (when tolerating fluids)).       Follow-up Information    Follow up with [facility].   Specialty:  Obstetrics and Gynecology   Why:  as scheduled on 01/21/2014   Contact information:   [address] [place] [phone]      Signed: [place] 01/04/2015, 7:11 AM

## 2015-01-04 DIAGNOSIS — N732 Unspecified parametritis and pelvic cellulitis: Secondary | ICD-10-CM

## 2015-01-04 NOTE — Progress Notes (Signed)
Pt ambulated out teaching complete  

## 2015-01-09 ENCOUNTER — Inpatient Hospital Stay (HOSPITAL_COMMUNITY): Payer: Medicaid Other

## 2015-01-09 ENCOUNTER — Inpatient Hospital Stay (HOSPITAL_COMMUNITY)
Admission: AD | Admit: 2015-01-09 | Discharge: 2015-01-12 | DRG: 863 | Disposition: A | Discharge status: Home / Self Care | Payer: Medicaid Other | Source: Ambulatory Visit | Attending: Obstetrics and Gynecology | Admitting: Obstetrics and Gynecology

## 2015-01-09 ENCOUNTER — Encounter (HOSPITAL_COMMUNITY): Payer: Self-pay | Admitting: *Deleted

## 2015-01-09 DIAGNOSIS — T814XXA Infection following a procedure, initial encounter: Principal | ICD-10-CM | POA: Diagnosis present

## 2015-01-09 DIAGNOSIS — N898 Other specified noninflammatory disorders of vagina: Secondary | ICD-10-CM | POA: Diagnosis present

## 2015-01-09 DIAGNOSIS — N760 Acute vaginitis: Secondary | ICD-10-CM

## 2015-01-09 DIAGNOSIS — N739 Female pelvic inflammatory disease, unspecified: Secondary | ICD-10-CM | POA: Diagnosis present

## 2015-01-09 LAB — URINALYSIS, ROUTINE W REFLEX MICROSCOPIC
Bilirubin Urine: NEGATIVE
GLUCOSE, UA: NEGATIVE mg/dL
KETONES UR: NEGATIVE mg/dL
LEUKOCYTES UA: NEGATIVE
NITRITE: NEGATIVE
PROTEIN: NEGATIVE mg/dL
Specific Gravity, Urine: 1.025 (ref 1.005–1.030)
pH: 5.5 (ref 5.0–8.0)

## 2015-01-09 LAB — COMPREHENSIVE METABOLIC PANEL
ALBUMIN: 2.7 g/dL — AB (ref 3.5–5.0)
ALT: 35 U/L (ref 14–54)
ANION GAP: 9 (ref 5–15)
AST: 18 U/L (ref 15–41)
Alkaline Phosphatase: 157 U/L — ABNORMAL HIGH (ref 38–126)
BILIRUBIN TOTAL: 0.5 mg/dL (ref 0.3–1.2)
BUN: 14 mg/dL (ref 6–20)
CO2: 24 mmol/L (ref 22–32)
Calcium: 8.7 mg/dL — ABNORMAL LOW (ref 8.9–10.3)
Chloride: 103 mmol/L (ref 101–111)
Creatinine, Ser: 0.93 mg/dL (ref 0.44–1.00)
GFR calc Af Amer: >60 mL/min (ref >60)
Glucose, Bld: 94 mg/dL (ref 65–99)
POTASSIUM: 3.8 mmol/L (ref 3.5–5.1)
Sodium: 136 mmol/L (ref 135–145)
TOTAL PROTEIN: 7.2 g/dL (ref 6.5–8.1)

## 2015-01-09 LAB — CBC WITH DIFFERENTIAL/PLATELET
BASOS PCT: 0 %
Basophils Absolute: 0 10*3/uL (ref 0.0–0.1)
Eosinophils Absolute: 0.1 10*3/uL (ref 0.0–0.7)
Eosinophils Relative: 1 %
HEMATOCRIT: 30.9 % — AB (ref 36.0–46.0)
Hemoglobin: 10 g/dL — ABNORMAL LOW (ref 12.0–15.0)
Lymphocytes Relative: 24 %
Lymphs Abs: 2.2 10*3/uL (ref 0.7–4.0)
MCH: 28.8 pg (ref 26.0–34.0)
MCHC: 32.4 g/dL (ref 30.0–36.0)
MCV: 89 fL (ref 78.0–100.0)
MONO ABS: 0.4 10*3/uL (ref 0.1–1.0)
MONOS PCT: 4 %
NEUTROS ABS: 6.6 10*3/uL (ref 1.7–7.7)
Neutrophils Relative %: 71 %
Platelets: 511 10*3/uL — ABNORMAL HIGH (ref 150–400)
RBC: 3.47 MIL/uL — ABNORMAL LOW (ref 3.87–5.11)
RDW: 13.7 % (ref 11.5–15.5)
WBC: 9.3 10*3/uL (ref 4.0–10.5)

## 2015-01-09 LAB — URINE MICROSCOPIC-ADD ON

## 2015-01-09 MED ORDER — ALUM & MAG HYDROXIDE-SIMETH 200-200-20 MG/5ML PO SUSP: 30.0000 mL | ORAL | Status: DC | PRN

## 2015-01-09 MED ORDER — IBUPROFEN 600 MG PO TABS
600.0000 mg | ORAL_TABLET | Freq: Four times a day (QID) | ORAL | Status: DC | PRN
  Administered 2015-01-10 – 2015-01-12 (×6): 600 mg via ORAL
  Filled 2015-01-09 (×6): qty 1

## 2015-01-09 MED ORDER — ZOLPIDEM TARTRATE 5 MG PO TABS
5.0000 mg | ORAL_TABLET | Freq: Every evening | ORAL | Status: DC | PRN
  Administered 2015-01-09 – 2015-01-10 (×2): 5 mg via ORAL
  Filled 2015-01-09 (×2): qty 1

## 2015-01-09 MED ORDER — HYDROMORPHONE HCL 1 MG/ML IJ SOLN
1.0000 mg | INTRAMUSCULAR | Status: DC | PRN
  Administered 2015-01-09 – 2015-01-11 (×6): 1 mg via INTRAVENOUS
  Filled 2015-01-09 (×6): qty 1

## 2015-01-09 MED ORDER — POLYETHYLENE GLYCOL 3350 17 G PO PACK
17.0000 g | PACK | Freq: Every day | ORAL | Status: DC | PRN
  Administered 2015-01-11 – 2015-01-12 (×2): 17 g via ORAL
  Filled 2015-01-09 (×2): qty 1

## 2015-01-09 MED ORDER — ONDANSETRON HCL 4 MG PO TABS: 4.0000 mg | ORAL_TABLET | Freq: Four times a day (QID) | ORAL | Status: DC | PRN

## 2015-01-09 MED ORDER — OXYCODONE-ACETAMINOPHEN 5-325 MG PO TABS: 1.0000 | ORAL_TABLET | ORAL | Status: DC | PRN

## 2015-01-09 MED ORDER — ONDANSETRON HCL 4 MG/2ML IJ SOLN: 4.0000 mg | Freq: Four times a day (QID) | INTRAMUSCULAR | Status: DC | PRN

## 2015-01-09 MED ORDER — SODIUM CHLORIDE 0.9 % IV SOLN
INTRAVENOUS | Status: DC
  Administered 2015-01-09: 16:00:00 via INTRAVENOUS

## 2015-01-09 MED ORDER — PIPERACILLIN-TAZOBACTAM 3.375 G IVPB 30 MIN
3.3750 g | Freq: Once | INTRAVENOUS | Status: AC
  Administered 2015-01-09: 3.375 g via INTRAVENOUS
  Filled 2015-01-09: qty 50

## 2015-01-09 MED ORDER — SODIUM CHLORIDE 0.9 % IV SOLN
Freq: Once | INTRAVENOUS | Status: AC
  Administered 2015-01-09: 20:00:00 via INTRAVENOUS
  Filled 2015-01-09 (×2): qty 1000

## 2015-01-09 MED ORDER — IOHEXOL 300 MG/ML  SOLN
100.0000 mL | Freq: Once | INTRAMUSCULAR | Status: AC | PRN
  Administered 2015-01-09: 100 mL via INTRAVENOUS

## 2015-01-09 MED ORDER — PIPERACILLIN-TAZOBACTAM 3.375 G IVPB
3.3750 g | Freq: Three times a day (TID) | INTRAVENOUS | Status: DC
  Administered 2015-01-10 – 2015-01-12 (×7): 3.375 g via INTRAVENOUS
  Filled 2015-01-09 (×8): qty 50

## 2015-01-09 MED ORDER — PRENATAL MULTIVITAMIN CH
1.0000 | ORAL_TABLET | Freq: Every day | ORAL | Status: DC
  Administered 2015-01-10 – 2015-01-11 (×2): 1 via ORAL
  Filled 2015-01-09 (×2): qty 1

## 2015-01-09 MED ORDER — DEXTROSE-NACL 5-0.45 % IV SOLN
INTRAVENOUS | Status: DC
  Administered 2015-01-10 (×3): via INTRAVENOUS

## 2015-01-09 NOTE — MAU Provider Note (Signed)
Chief Complaint:  No chief complaint on file.   First Provider Initiated Contact with Patient 01/09/15 1532     HPI  HPI: Maria Logan is a 49 y.o. [id number] who presents to maternity admissions reporting abdominal pain, vaginal discharge which flows out when she goes to the bathroom, and trouble sleeping. . She reports no vaginal bleeding, vaginal itching/burning, urinary symptoms, h/a, dizziness, n/v, or fever/chills.   Had a TAH on 12/23/14 for symptomatic fibroids.  She was found to have vaginal cuff infection on 01/01/15 and was admitted for antibiotics. She was discharged home with PO antibiotics x 3 on 01/04/15.   Since discharge she has continued to have abdominal pain and copious vaginal discharge.    RN Note: Patient had total abdominal hysterectomy on 12/23/14, had infection after surgery was released on Christmas morning, taking antibiotics, lower abdominal cramping, having vaginal discharge still, not sleeping.         Past Medical History: Past Medical History  Diagnosis Date  . Fibroids   . Vaginal delivery 1988, 1990, 2005    Past obstetric history: OB History  Gravida Para Term Preterm AB SAB TAB Ectopic Multiple Living  3 3 3   0 0    3    # Outcome Date GA Lbr Len/2nd Weight Sex Delivery Anes PTL Lv  3 Term 07/24/03 [redacted]w[redacted]d    Vag-Spont     2 Term 04/02/88 [redacted]w[redacted]d    Vag-Spont     1 Term 07/20/86 [redacted]w[redacted]d    Vag-Spont        Comments: no complications      Past Surgical History: Past Surgical History  Procedure Laterality Date  . Examination under anesthesia N/A 06/04/2014    Procedure: EXAM UNDER ANESTHESIA;  Surgeon: Guss Bunde, MD;  Location: [facility];  Service: Gynecology;  Laterality: N/A;  . Operative ultrasound N/A 06/04/2014    Procedure: OPERATIVE ULTRASOUND;  Surgeon: Guss Bunde, MD;  Location: [facility];  Service: Gynecology;  Laterality: N/A;  . Abdominal hysterectomy N/A 12/23/2014    Procedure: HYSTERECTOMY ABDOMINAL;  Surgeon: Woodroe Mode, MD;  Location: [facility];  Service: Gynecology;  Laterality: N/A;  Requested 12/23/14 @ 1:00p  . Bilateral salpingectomy Bilateral 12/23/2014    Procedure: BILATERAL SALPINGECTOMY;  Surgeon: Woodroe Mode, MD;  Location: [facility];  Service: Gynecology;  Laterality: Bilateral;    Family History: Family History  Problem Relation Age of Onset  . Alcohol abuse Neg Hx   . Arthritis Neg Hx   . Asthma Neg Hx   . Birth defects Neg Hx   . Cancer Neg Hx   . COPD Neg Hx   . Depression Neg Hx   . Diabetes Neg Hx   . Drug abuse Neg Hx   . Early death Neg Hx   . Hearing loss Neg Hx   . Heart disease Neg Hx   . Hyperlipidemia Neg Hx   . Hypertension Neg Hx   . Kidney disease Neg Hx   . Learning disabilities Neg Hx   . Mental illness Neg Hx   . Mental retardation Neg Hx   . Miscarriages / Stillbirths Neg Hx   . Stroke Neg Hx   . Vision loss Neg Hx   . Varicose Veins Neg Hx     Social History: Social History  Substance Use Topics  . Smoking status: Never Smoker   . Smokeless tobacco: Never Used  . Alcohol Use: No    Allergies: No Known Allergies  Meds:  Prescriptions prior to admission  Medication Sig Dispense Refill Last Dose  . docusate sodium (COLACE) 100 MG capsule Take 1 capsule (100 mg total) by mouth 2 (two) times daily. 10 capsule 0 01/09/2015 at Unknown time  . doxycycline (VIBRAMYCIN) 100 MG capsule Take 1 capsule (100 mg total) by mouth 2 (two) times daily. 28 capsule 0 01/09/2015 at Unknown time  . ibuprofen (ADVIL,MOTRIN) 600 MG tablet Take 1 tablet (600 mg total) by mouth every 6 (six) hours as needed for moderate pain or cramping. 30 tablet 0 01/08/2015 at Unknown time  . metroNIDAZOLE (FLAGYL) 500 MG tablet Take 1 tablet (500 mg total) by mouth 2 (two) times daily. 28 tablet 0 01/09/2015 at Unknown time  . oxyCODONE-acetaminophen (PERCOCET/ROXICET) 5-325 MG tablet Take 1-2 tablets by mouth every 3 (three) hours as needed (moderate to severe pain (when tolerating  fluids)). 30 tablet 0 Past Week at Unknown time    Review of Systems  Constitutional: Positive for fatigue. Negative for fever and chills.  Respiratory: Negative for shortness of breath.   Gastrointestinal: Positive for abdominal pain. Negative for nausea, vomiting, diarrhea and constipation.  Genitourinary: Positive for vaginal discharge and pelvic pain. Negative for dysuria, vaginal bleeding and difficulty urinating.  Musculoskeletal: Negative for myalgias and back pain.  Other systems negative  I have reviewed patient's Past Medical Hx, Surgical Hx, Family Hx, Social Hx, medications and allergies.   Physical Exam  Patient Vitals for the past 24 hrs:  BP Temp Temp src Pulse Resp Height Weight  01/09/15 1352 111/71 mmHg 98.4 F (36.9 C) Oral 92 18 5\' 6"  (1.676 m) 72.576 kg (160 lb)   Constitutional: Well-developed, well-nourished female in no acute distress.  Cardiovascular: normal rate and rhythm, no ectopy audible, S1 & S2 heard, no murmur Respiratory: normal effort, no distress. Lungs CTAB with no wheezes or crackles GI: Abd soft, tender, particularly over suprapubic and midline abdominal areas.  Nondistended.  No rebound, No guarding.  Bowel Sounds audible  MS: Extremities nontender, no edema, normal ROM Neurologic: Alert and oriented x 4.   Grossly nonfocal. GU: Neg CVAT. Skin:  Warm and Dry Psych:  Affect appropriate.  PELVIC EXAM: Done by Dr Gala Romney who reports there was copious vaginal discharge and small opening at the top of the vaginal cuff.   Labs: Results for orders placed or performed during the hospital encounter of 01/09/15 (from the past 24 hour(s))  Urinalysis, Routine w reflex microscopic (not at [facility])     Status: Abnormal   Collection Time: 01/09/15  2:00 PM  Result Value Ref Range   Color, Urine YELLOW YELLOW   APPearance CLEAR CLEAR   Specific Gravity, Urine 1.025 1.005 - 1.030   pH 5.5 5.0 - 8.0   Glucose, UA NEGATIVE NEGATIVE mg/dL   Hgb urine  dipstick LARGE (A) NEGATIVE   Bilirubin Urine NEGATIVE NEGATIVE   Ketones, ur NEGATIVE NEGATIVE mg/dL   Protein, ur NEGATIVE NEGATIVE mg/dL   Nitrite NEGATIVE NEGATIVE   Leukocytes, UA NEGATIVE NEGATIVE  Urine microscopic-add on     Status: Abnormal   Collection Time: 01/09/15  2:00 PM  Result Value Ref Range   Squamous Epithelial / LPF 0-5 (A) NONE SEEN   WBC, UA 0-5 0 - 5 WBC/hpf   RBC / HPF 0-5 0 - 5 RBC/hpf   Bacteria, UA RARE (A) NONE SEEN   --/--/B POS (12/01 0850)  Imaging:  Ct Abdomen Pelvis W Contrast  01/09/2015  CLINICAL DATA:  Status post  total abdominal hysterectomy on 0000000 complicated by vaginal cuff cellulitis, now presenting with lower pelvic cramping and vaginal discharge and clinical concern for abscess. EXAM: CT ABDOMEN AND PELVIS WITH CONTRAST TECHNIQUE: Multidetector CT imaging of the abdomen and pelvis was performed using the standard protocol following bolus administration of intravenous contrast. CONTRAST:  163mL OMNIPAQUE IOHEXOL 300 MG/ML  SOLN COMPARISON:  12/27/2014 CT abdomen/ pelvis. FINDINGS: Lower chest: No significant pulmonary nodules or acute consolidative airspace disease. Hepatobiliary: Normal liver with no liver mass. Normal gallbladder with no radiopaque cholelithiasis. No biliary ductal dilatation. Pancreas: Normal, with no mass or duct dilation. Spleen: Normal size. No mass. Adrenals/Urinary Tract: Normal adrenals. Stable subcentimeter hypodense renal cortical lesion in the upper right kidney, too small to characterize. Stable simple 3.0 cm renal cyst in the anterior mid to lower left kidney. No hydronephrosis. Normal bladder. Stomach/Bowel: Small hiatal hernia. Otherwise collapsed and grossly normal stomach. Normal caliber small bowel with no small bowel wall thickening. Normal appendix. Normal large bowel with no diverticulosis, large bowel wall thickening or pericolonic fat stranding. Vascular/Lymphatic: Mildly atherosclerotic nonaneurysmal  abdominal aorta. Patent portal, splenic, hepatic and renal veins. No pathologically enlarged lymph nodes in the abdomen or pelvis. Reproductive: Status post hysterectomy. At the vaginal cuff there is an irregular 4.8 x 2.3 x 3.9 cm gas and fluid containing collection (series 2/ image 65) with a thick enhancing wall and prominent surrounding inflammatory fat stranding, in keeping with a pelvic abscess. No adnexal mass. Other: No abdominal ascites. Musculoskeletal: No aggressive appearing focal osseous lesions. Expected fat stranding and ill-defined fluid at the transverse laparotomy site in the ventral superficial pelvic wall, with no focal drainable superficial fluid collection. IMPRESSION: Irregular pelvic abscess measuring 4.8 x 2.3 x 3.9 cm at the vaginal cuff. Electronically Signed   By: Ilona Sorrel M.D.   On: 01/09/2015 17:32   Ct Abdomen Pelvis W Contrast  12/27/2014  CLINICAL DATA:  Severe lower abdominal pain. Hysterectomy three days prior 12/23/2014 EXAM: CT ABDOMEN AND PELVIS WITH CONTRAST TECHNIQUE: Multidetector CT imaging of the abdomen and pelvis was performed using the standard protocol following bolus administration of intravenous contrast. CONTRAST:  63mL OMNIPAQUE IOHEXOL 300 MG/ML SOLN, 145mL OMNIPAQUE IOHEXOL 300 MG/ML SOLN COMPARISON:  Preoperative MRI 05/15/2014 FINDINGS: Lower chest:  Minimal dependent atelectasis at the lung bases. Liver: No focal lesion. Hepatobiliary: Gallbladder physiologically distended, no biliary dilatation. Pancreas: No ductal dilatation or inflammation. Spleen: Normal. Adrenal glands: No nodule. Kidneys: Symmetric renal enhancement and excretion. No hydronephrosis. Bilateral renal cysts, largest measuring 3.1 cm in the lower left kidney. No perinephric stranding. Stomach/Bowel: Stomach physiologically distended. There are no dilated or thickened small bowel loops. Moderate to large stool burden in the ascending, transverse, and proximal descending colon, the  distal most colon is decompressed. The appendix is tentatively identified and normal. Vascular/Lymphatic: No retroperitoneal adenopathy. Abdominal aorta is normal in caliber. Reproductive: Post hysterectomy with soft tissue edema of the pelvis. Minimal free fluid dependently but no peripherally enhancing fluid collection. Scattered foci of air in the pelvis and anterior abdominal wall, a normal finding post recent hysterectomy. Ovaries are not confidently identified. Bladder: Distended, almost to the level of the umbilicus. Other: Scattered free air in the anterior abdomen, an expected finding post recent abdominal surgery. Small amount of free fluid in the right pericolic gutter. Postsurgical change in the subcutaneous tissues anteriorly, no subcutaneous abscess. Musculoskeletal: There are no acute or suspicious osseous abnormalities. IMPRESSION: 1. Edema and free fluid in the pelvis, no rim enhancing  fluid collection to suggest abscess post hysterectomy. Expected postsurgical air within the abdomen. 2. Distended urinary bladder. 3. Moderate to large stool burden from the ascending through the descending colon. Electronically Signed   By: Jeb Levering M.D.   On: 12/27/2014 01:06   Dg Abd 2 Views  12/30/2014  CLINICAL DATA:  Status post hysterectomy 12/23/2014. Incisional pain radiating into the mid abdomen and low back for 3 days. EXAM: ABDOMEN - 2 VIEW COMPARISON:  CT abdomen and pelvis 12/27/2014. FINDINGS: There is gaseous distention of the colon with air-fluid levels identified. No evidence of small bowel obstruction is seen. No free intraperitoneal air is identified. No abnormal abdominal calcification is seen. IMPRESSION: Gaseous distention of the colon may be due to ileus. Negative for free intraperitoneal air or small bowel obstruction. Electronically Signed   By: Inge Rise M.D.   On: 12/30/2014 09:41    MAU Course/MDM: I have ordered labs as follows: CBC and CMET Imaging ordered:  Abdominal Pelvic CT with contrast Results reviewed.   Consult Dr Glo Herring  Treatments in MAU included analgesia and antibiotics (first dose of Zosyn)  Assessment: Pelvic Abscess  Plan: Admit to [facility] Routine orders Zosyn for abscess treatment     Medication List    ASK your doctor about these medications        docusate sodium 100 MG capsule  Commonly known as:  COLACE  Take 1 capsule (100 mg total) by mouth 2 (two) times daily.     doxycycline 100 MG capsule  Commonly known as:  VIBRAMYCIN  Take 1 capsule (100 mg total) by mouth 2 (two) times daily.     ibuprofen 600 MG tablet  Commonly known as:  ADVIL,MOTRIN  Take 1 tablet (600 mg total) by mouth every 6 (six) hours as needed for moderate pain or cramping.     metroNIDAZOLE 500 MG tablet  Commonly known as:  FLAGYL  Take 1 tablet (500 mg total) by mouth 2 (two) times daily.     oxyCODONE-acetaminophen 5-325 MG tablet  Commonly known as:  PERCOCET/ROXICET  Take 1-2 tablets by mouth every 3 (three) hours as needed (moderate to severe pain (when tolerating fluids)).       Encouraged to return here or to other Urgent Care/ED if she develops worsening of symptoms, increase in pain, fever, or other concerning symptoms.   Hansel Feinstein CNM, MSN Certified Nurse-Midwife 01/09/2015 3:32 PM

## 2015-01-09 NOTE — H&P (Signed)
Chief Complaint:  No chief complaint on file.   First Provider Initiated Contact with Patient 01/09/15 1532     HPI  HPI: Maria Logan is a 49 y.o. [id number] who presents to maternity admissions reporting abdominal pain, vaginal discharge which flows out when she goes to the bathroom, and trouble sleeping. . She reports no vaginal bleeding, vaginal itching/burning, urinary symptoms, h/a, dizziness, n/v, or fever/chills.   Had a TAH on 12/23/14 for symptomatic fibroids.  She was found to have vaginal cuff infection on 01/01/15 and was admitted for antibiotics. She was discharged home with PO antibiotics x 3 on 01/04/15.   Since discharge she has continued to have abdominal pain and copious vaginal discharge.    RN Note: Patient had total abdominal hysterectomy on 12/23/14, had infection after surgery was released on Christmas morning, taking antibiotics, lower abdominal cramping, having vaginal discharge still, not sleeping.         Past Medical History: Past Medical History  Diagnosis Date  . Fibroids   . Vaginal delivery 1988, 1990, 2005    Past obstetric history: OB History  Gravida Para Term Preterm AB SAB TAB Ectopic Multiple Living  3 3 3   0 0    3    # Outcome Date GA Lbr Len/2nd Weight Sex Delivery Anes PTL Lv  3 Term 07/24/03 [redacted]w[redacted]d    Vag-Spont     2 Term 04/02/88 [redacted]w[redacted]d    Vag-Spont     1 Term 07/20/86 [redacted]w[redacted]d    Vag-Spont        Comments: no complications      Past Surgical History: Past Surgical History  Procedure Laterality Date  . Examination under anesthesia N/A 06/04/2014    Procedure: EXAM UNDER ANESTHESIA;  Surgeon: Guss Bunde, MD;  Location: [facility];  Service: Gynecology;  Laterality: N/A;  . Operative ultrasound N/A 06/04/2014    Procedure: OPERATIVE ULTRASOUND;  Surgeon: Guss Bunde, MD;  Location: [facility];  Service: Gynecology;  Laterality: N/A;  . Abdominal hysterectomy N/A 12/23/2014    Procedure: HYSTERECTOMY ABDOMINAL;  Surgeon: Woodroe Mode, MD;  Location: [facility];  Service: Gynecology;  Laterality: N/A;  Requested 12/23/14 @ 1:00p  . Bilateral salpingectomy Bilateral 12/23/2014    Procedure: BILATERAL SALPINGECTOMY;  Surgeon: Woodroe Mode, MD;  Location: [facility];  Service: Gynecology;  Laterality: Bilateral;    Family History: Family History  Problem Relation Age of Onset  . Alcohol abuse Neg Hx   . Arthritis Neg Hx   . Asthma Neg Hx   . Birth defects Neg Hx   . Cancer Neg Hx   . COPD Neg Hx   . Depression Neg Hx   . Diabetes Neg Hx   . Drug abuse Neg Hx   . Early death Neg Hx   . Hearing loss Neg Hx   . Heart disease Neg Hx   . Hyperlipidemia Neg Hx   . Hypertension Neg Hx   . Kidney disease Neg Hx   . Learning disabilities Neg Hx   . Mental illness Neg Hx   . Mental retardation Neg Hx   . Miscarriages / Stillbirths Neg Hx   . Stroke Neg Hx   . Vision loss Neg Hx   . Varicose Veins Neg Hx     Social History: Social History  Substance Use Topics  . Smoking status: Never Smoker   . Smokeless tobacco: Never Used  . Alcohol Use: No    Allergies: No Known Allergies  Meds:  Prescriptions prior to admission  Medication Sig Dispense Refill Last Dose  . docusate sodium (COLACE) 100 MG capsule Take 1 capsule (100 mg total) by mouth 2 (two) times daily. 10 capsule 0 01/09/2015 at Unknown time  . doxycycline (VIBRAMYCIN) 100 MG capsule Take 1 capsule (100 mg total) by mouth 2 (two) times daily. 28 capsule 0 01/09/2015 at Unknown time  . ibuprofen (ADVIL,MOTRIN) 600 MG tablet Take 1 tablet (600 mg total) by mouth every 6 (six) hours as needed for moderate pain or cramping. 30 tablet 0 01/08/2015 at Unknown time  . metroNIDAZOLE (FLAGYL) 500 MG tablet Take 1 tablet (500 mg total) by mouth 2 (two) times daily. 28 tablet 0 01/09/2015 at Unknown time  . oxyCODONE-acetaminophen (PERCOCET/ROXICET) 5-325 MG tablet Take 1-2 tablets by mouth every 3 (three) hours as needed (moderate to severe pain (when tolerating  fluids)). 30 tablet 0 Past Week at Unknown time    Review of Systems  Constitutional: Positive for fatigue. Negative for fever and chills.  Respiratory: Negative for shortness of breath.   Gastrointestinal: Positive for abdominal pain. Negative for nausea, vomiting, diarrhea and constipation.  Genitourinary: Positive for vaginal discharge and pelvic pain. Negative for dysuria, vaginal bleeding and difficulty urinating.  Musculoskeletal: Negative for myalgias and back pain.  Other systems negative  I have reviewed patient's Past Medical Hx, Surgical Hx, Family Hx, Social Hx, medications and allergies.   Physical Exam  Patient Vitals for the past 24 hrs:  BP Temp Temp src Pulse Resp Height Weight  01/09/15 1352 111/71 mmHg 98.4 F (36.9 C) Oral 92 18 5\' 6"  (1.676 m) 72.576 kg (160 lb)   Constitutional: Well-developed, well-nourished female in no acute distress.  Cardiovascular: normal rate and rhythm, no ectopy audible, S1 & S2 heard, no murmur Respiratory: normal effort, no distress. Lungs CTAB with no wheezes or crackles GI: Abd soft, tender, particularly over suprapubic and midline abdominal areas.  Nondistended.  No rebound, No guarding.  Bowel Sounds audible  MS: Extremities nontender, no edema, normal ROM Neurologic: Alert and oriented x 4.   Grossly nonfocal. GU: Neg CVAT. Skin:  Warm and Dry Psych:  Affect appropriate.  PELVIC EXAM: Done by Dr Gala Romney who reports there was copious vaginal discharge and small opening at the top of the vaginal cuff.   Labs: Results for orders placed or performed during the hospital encounter of 01/09/15 (from the past 24 hour(s))  Urinalysis, Routine w reflex microscopic (not at [facility])     Status: Abnormal   Collection Time: 01/09/15  2:00 PM  Result Value Ref Range   Color, Urine YELLOW YELLOW   APPearance CLEAR CLEAR   Specific Gravity, Urine 1.025 1.005 - 1.030   pH 5.5 5.0 - 8.0   Glucose, UA NEGATIVE NEGATIVE mg/dL   Hgb urine  dipstick LARGE (A) NEGATIVE   Bilirubin Urine NEGATIVE NEGATIVE   Ketones, ur NEGATIVE NEGATIVE mg/dL   Protein, ur NEGATIVE NEGATIVE mg/dL   Nitrite NEGATIVE NEGATIVE   Leukocytes, UA NEGATIVE NEGATIVE  Urine microscopic-add on     Status: Abnormal   Collection Time: 01/09/15  2:00 PM  Result Value Ref Range   Squamous Epithelial / LPF 0-5 (A) NONE SEEN   WBC, UA 0-5 0 - 5 WBC/hpf   RBC / HPF 0-5 0 - 5 RBC/hpf   Bacteria, UA RARE (A) NONE SEEN   --/--/B POS (12/01 0850)  Imaging:  Ct Abdomen Pelvis W Contrast  01/09/2015  CLINICAL DATA:  Status post  total abdominal hysterectomy on 0000000 complicated by vaginal cuff cellulitis, now presenting with lower pelvic cramping and vaginal discharge and clinical concern for abscess. EXAM: CT ABDOMEN AND PELVIS WITH CONTRAST TECHNIQUE: Multidetector CT imaging of the abdomen and pelvis was performed using the standard protocol following bolus administration of intravenous contrast. CONTRAST:  144mL OMNIPAQUE IOHEXOL 300 MG/ML  SOLN COMPARISON:  12/27/2014 CT abdomen/ pelvis. FINDINGS: Lower chest: No significant pulmonary nodules or acute consolidative airspace disease. Hepatobiliary: Normal liver with no liver mass. Normal gallbladder with no radiopaque cholelithiasis. No biliary ductal dilatation. Pancreas: Normal, with no mass or duct dilation. Spleen: Normal size. No mass. Adrenals/Urinary Tract: Normal adrenals. Stable subcentimeter hypodense renal cortical lesion in the upper right kidney, too small to characterize. Stable simple 3.0 cm renal cyst in the anterior mid to lower left kidney. No hydronephrosis. Normal bladder. Stomach/Bowel: Small hiatal hernia. Otherwise collapsed and grossly normal stomach. Normal caliber small bowel with no small bowel wall thickening. Normal appendix. Normal large bowel with no diverticulosis, large bowel wall thickening or pericolonic fat stranding. Vascular/Lymphatic: Mildly atherosclerotic nonaneurysmal  abdominal aorta. Patent portal, splenic, hepatic and renal veins. No pathologically enlarged lymph nodes in the abdomen or pelvis. Reproductive: Status post hysterectomy. At the vaginal cuff there is an irregular 4.8 x 2.3 x 3.9 cm gas and fluid containing collection (series 2/ image 65) with a thick enhancing wall and prominent surrounding inflammatory fat stranding, in keeping with a pelvic abscess. No adnexal mass. Other: No abdominal ascites. Musculoskeletal: No aggressive appearing focal osseous lesions. Expected fat stranding and ill-defined fluid at the transverse laparotomy site in the ventral superficial pelvic wall, with no focal drainable superficial fluid collection. IMPRESSION: Irregular pelvic abscess measuring 4.8 x 2.3 x 3.9 cm at the vaginal cuff. Electronically Signed   By: Ilona Sorrel M.D.   On: 01/09/2015 17:32   Ct Abdomen Pelvis W Contrast  12/27/2014  CLINICAL DATA:  Severe lower abdominal pain. Hysterectomy three days prior 12/23/2014 EXAM: CT ABDOMEN AND PELVIS WITH CONTRAST TECHNIQUE: Multidetector CT imaging of the abdomen and pelvis was performed using the standard protocol following bolus administration of intravenous contrast. CONTRAST:  48mL OMNIPAQUE IOHEXOL 300 MG/ML SOLN, 182mL OMNIPAQUE IOHEXOL 300 MG/ML SOLN COMPARISON:  Preoperative MRI 05/15/2014 FINDINGS: Lower chest:  Minimal dependent atelectasis at the lung bases. Liver: No focal lesion. Hepatobiliary: Gallbladder physiologically distended, no biliary dilatation. Pancreas: No ductal dilatation or inflammation. Spleen: Normal. Adrenal glands: No nodule. Kidneys: Symmetric renal enhancement and excretion. No hydronephrosis. Bilateral renal cysts, largest measuring 3.1 cm in the lower left kidney. No perinephric stranding. Stomach/Bowel: Stomach physiologically distended. There are no dilated or thickened small bowel loops. Moderate to large stool burden in the ascending, transverse, and proximal descending colon, the  distal most colon is decompressed. The appendix is tentatively identified and normal. Vascular/Lymphatic: No retroperitoneal adenopathy. Abdominal aorta is normal in caliber. Reproductive: Post hysterectomy with soft tissue edema of the pelvis. Minimal free fluid dependently but no peripherally enhancing fluid collection. Scattered foci of air in the pelvis and anterior abdominal wall, a normal finding post recent hysterectomy. Ovaries are not confidently identified. Bladder: Distended, almost to the level of the umbilicus. Other: Scattered free air in the anterior abdomen, an expected finding post recent abdominal surgery. Small amount of free fluid in the right pericolic gutter. Postsurgical change in the subcutaneous tissues anteriorly, no subcutaneous abscess. Musculoskeletal: There are no acute or suspicious osseous abnormalities. IMPRESSION: 1. Edema and free fluid in the pelvis, no rim enhancing  fluid collection to suggest abscess post hysterectomy. Expected postsurgical air within the abdomen. 2. Distended urinary bladder. 3. Moderate to large stool burden from the ascending through the descending colon. Electronically Signed   By: Jeb Levering M.D.   On: 12/27/2014 01:06   Dg Abd 2 Views  12/30/2014  CLINICAL DATA:  Status post hysterectomy 12/23/2014. Incisional pain radiating into the mid abdomen and low back for 3 days. EXAM: ABDOMEN - 2 VIEW COMPARISON:  CT abdomen and pelvis 12/27/2014. FINDINGS: There is gaseous distention of the colon with air-fluid levels identified. No evidence of small bowel obstruction is seen. No free intraperitoneal air is identified. No abnormal abdominal calcification is seen. IMPRESSION: Gaseous distention of the colon may be due to ileus. Negative for free intraperitoneal air or small bowel obstruction. Electronically Signed   By: Inge Rise M.D.   On: 12/30/2014 09:41    MAU Course/MDM: I have ordered labs as follows: CBC and CMET Imaging ordered:  Abdominal Pelvic CT with contrast Results reviewed.   Consult Dr Glo Herring  Treatments in MAU included analgesia and antibiotics (first dose of Zosyn)  Assessment: Pelvic Abscess  Plan: Admit to [facility] Unit Routine orders Zosyn for abscess treatment     Medication List    ASK your doctor about these medications        docusate sodium 100 MG capsule  Commonly known as:  COLACE  Take 1 capsule (100 mg total) by mouth 2 (two) times daily.     doxycycline 100 MG capsule  Commonly known as:  VIBRAMYCIN  Take 1 capsule (100 mg total) by mouth 2 (two) times daily.     ibuprofen 600 MG tablet  Commonly known as:  ADVIL,MOTRIN  Take 1 tablet (600 mg total) by mouth every 6 (six) hours as needed for moderate pain or cramping.     metroNIDAZOLE 500 MG tablet  Commonly known as:  FLAGYL  Take 1 tablet (500 mg total) by mouth 2 (two) times daily.     oxyCODONE-acetaminophen 5-325 MG tablet  Commonly known as:  PERCOCET/ROXICET  Take 1-2 tablets by mouth every 3 (three) hours as needed (moderate to severe pain (when tolerating fluids)).       Encouraged to return here or to other Urgent Care/ED if she develops worsening of symptoms, increase in pain, fever, or other concerning symptoms.   Hansel Feinstein CNM, MSN Certified Nurse-Midwife 01/09/2015 3:32 PM

## 2015-01-09 NOTE — MAU Note (Signed)
Patient had total abdominal hysterectomy on 12/23/14, had infection after surgery was released on Christmas morning, taking antibiotics, lower abdominal cramping, having vaginal discharge still, not sleeping.

## 2015-01-09 NOTE — Progress Notes (Signed)
ANTIBIOTIC CONSULT NOTE - INITIAL  Pharmacy Consult for Zosyn Indication: Vaginal cuff cellulitis  No Known Allergies  Patient Measurements: Height: 5\' 6"  (167.6 cm) Weight: 160 lb (72.576 kg) IBW/kg (Calculated) : 59.3   Vital Signs: Temp: 98.4 F (36.9 C) (12/30 1830) Temp Source: Oral (12/30 1830) BP: 111/61 mmHg (12/30 1830) Pulse Rate: 76 (12/30 1830)  Labs:  Recent Labs  01/09/15 1752  WBC 9.3  HGB 10.0*  PLT 511*  CREATININE 0.93     Microbiology: Recent Results (from the past 720 hour(s))  Urine culture     Status: None   Collection Time: 12/26/14 11:52 PM  Result Value Ref Range Status   Specimen Description URINE, CLEAN CATCH  Final   Special Requests NONE  Final   Culture   Final    2,000 COLONIES/mL INSIGNIFICANT GROWTH Performed at [facility]    Report Status 12/28/2014 FINAL  Final    Medications:  Failed outpatient therapy with Flagyl and Doxycycline  Assessment: 49 y.o. female [id number] s/p TAH on 12/13 and re-admitted on 12/22 due to vaginal cuff infection. Pt was d/c'd on 12/25 on oral therapy and now presents with increased abdominal pain and vaginal discharge   Goal of Therapy:  Resolution of infection post multi-admissions  Plan:  Zosyn 3.375grams IV q8h. No dosage adjustment necessary. Will continue to follow and available for further assistance if requested.  Maria Logan 01/09/2015,6:58 PM

## 2015-01-11 NOTE — Progress Notes (Signed)
Patient ID: Maria Logan, female   DOB: September 14, 1965, 50 y.o.   MRN: [id number] Post op TAHBSO 12/23/2014 with post operative cuff abscess  Subjective: Patient reports nausea and tolerating PO.   Continues to drain, crampy pain is better  Objective: I have reviewed patient's vital signs, intake and output, medications, labs, microbiology, pathology and radiology results.  General: alert, cooperative and mild distress GI: incision: clean, dry and intact and soft moderately tender no rebound  Assessment: s/p TAHBSO with cuff abscess: stable improving slowly  Plan: Continue IV zosyn for 3-4 days or as clinically indicated  Discharge on long course on of antibiotics cipro/flagyl  LOS: 2 days    Golden Emile H 01/11/2015, 7:57 AM

## 2015-01-11 NOTE — Progress Notes (Signed)
Post op TAHBSO 12/23/2014 with post operative cuff abscess  Subjective: Patient reports nausea and tolerating PO.   Continues to drain, crampy pain is better  Objective: I have reviewed patient's vital signs, intake and output, medications, labs, microbiology, pathology and radiology results.  General: alert, cooperative and mild distress GI: incision: clean, dry and intact and soft moderately tender no rebound  Assessment: s/p TAHBSO with cuff abscess: stable improving slowly  Plan: Continue IV zosyn for 3-4 days or as clinically indicated  Discharge on long course on of antibiotics cipro/flagyl  LOS: 2 days    Joyia Riehle H 01/11/2015, 7:54 AM

## 2015-01-12 MED ORDER — CIPROFLOXACIN HCL 500 MG PO TABS: 500.0000 mg | ORAL_TABLET | Freq: Two times a day (BID) | ORAL | Status: DC

## 2015-01-12 MED ORDER — OXYCODONE-ACETAMINOPHEN 5-325 MG PO TABS: 1.0000 | ORAL_TABLET | ORAL | Status: DC | PRN

## 2015-01-12 NOTE — Progress Notes (Signed)
Subjective: day 3 of antibiotics for small pelvic cuff abscess, draining per vagina,  Patient reports nausea, vomiting, tolerating PO, + flatus, + BM and no problems voiding.   Pain is a 4/10. Objective: I have reviewed patient's vital signs, medications and labs.  General: alert, cooperative and no distress Resp: clear to auscultation bilaterally GI: soft, non-tender; bowel sounds normal; no masses,  no organomegaly Vaginal Bleeding: lighter discharge than friday. pt has her meds now and will commit to reliably taking them   Assessment/Plan: Persistent vag cuff abscess, now draining per vagina. Afebrile x 3 days. Will convert to out patient care.resume doxycycline, and flagyl.   LOS: 3 days    Charlei Ramsaran V 01/12/2015, 7:28 AM

## 2015-01-12 NOTE — Progress Notes (Signed)
Patient discharged to home... Patient drove self to hospital on day of admission, and was stable to drive home day of discharge... Discharge instructions reviewed with patient and she verbalized understanding... Condition stable... No equipment... Ambulated to car with Janalyn Shy, RN.

## 2015-01-12 NOTE — Discharge Instructions (Signed)

## 2015-01-12 NOTE — Discharge Summary (Addendum)
Physician Discharge Summary  Patient ID: Maria Logan MRN: [id number] DOB/AGE: [birth year] 50 y.o.  Admit date: 01/09/2015 Discharge date: 01/12/2015  Admission Diagnoses: postoperative pelvic abscess  Discharge Diagnoses:  Active Problems:   Abscess of female pelvis  goodDischarged Condition: good  Hospital Course: Maria Logan is a 50 y.o. [phone] who presents to maternity admissions reporting abdominal pain, vaginal discharge which flows out when she goes to the bathroom, and trouble sleeping. . She reports no vaginal bleeding, vaginal itching/burning, urinary symptoms, h/a, dizziness, n/v, or fever/chills.   Had a TAH on 12/23/14 for symptomatic fibroids. She was found to have vaginal cuff infection on 01/01/15 and was admitted for antibiotics. She was discharged home with PO antibiotics x 3 on 01/04/15. Since discharge she has continued to have abdominal pain and copious vaginal discharge.  She was admitted for antibiotic therapy and intravenous Zosyn was administered 3 days. She remained afebrile. White count was 9300. Vaginal discharge improved slightly CT showed that the abscess was The cuff and it is felt that it was draining through the cuff which was noted by Dr. Gala Romney  at her admission exam to have a small opening allowing vaginal discharge from above/ The patient had not filled her medications on xmas day when she was discharged from the hospital. She filled her doxy and flagyl on 12/28  Consults: None  Significant Diagnostic Studies: labs:  CBC Latest Ref Rng 01/09/2015 01/02/2015 01/01/2015  WBC 4.0 - 10.5 K/uL 9.3 13.9(H) 15.4(H)  Hemoglobin 12.0 - 15.0 g/dL 10.0(L) 8.7(L) 9.7(L)  Hematocrit 36.0 - 46.0 % 30.9(L) 25.7(L) 28.6(L)  Platelets 150 - 400 K/uL 511(H) 340 312      Treatments: IV hydration and antibiotics: Zosyn  Discharge Exam: Blood pressure 105/54, pulse 66, temperature 98.6 F (37 C), temperature source Oral, resp. rate 16, height 5\' 6"   (1.676 m), weight 72.576 kg (160 lb), last menstrual period 12/09/2014, SpO2 100 %. General appearance: alert, cooperative and no distress Head: Normocephalic, without obvious abnormality, atraumatic Chest wall: no tenderness GI: soft, non-tender; bowel sounds normal; no masses,  no organomegaly Pelvic: diminishing drainage per vagina, but still moderate Extremities: extremities normal, atraumatic, no cyanosis or edema Incision/Wound:abd incision clean.  Disposition: 01-Home or Self Care     Medication List    ASK your doctor about these medications        docusate sodium 100 MG capsule  Commonly known as:  COLACE  Take 1 capsule (100 mg total) by mouth 2 (two) times daily.     doxycycline 100 MG capsule  Commonly known as:  VIBRAMYCIN  Take 1 capsule (100 mg total) by mouth 2 (two) times daily.     ibuprofen 600 MG tablet  Commonly known as:  ADVIL,MOTRIN  Take 1 tablet (600 mg total) by mouth every 6 (six) hours as needed for moderate pain or cramping.     metroNIDAZOLE 500 MG tablet  Commonly known as:  FLAGYL  Take 1 tablet (500 mg total) by mouth 2 (two) times daily.     oxyCODONE-acetaminophen 5-325 MG tablet  Commonly known as:  PERCOCET/ROXICET  Take 1-2 tablets by mouth every 3 (three) hours as needed (moderate to severe pain (when tolerating fluids)).           Follow-up Information    Follow up with ARNOLD,JAMES, MD In 2 weeks.   Specialty:  Obstetrics and Gynecology   Why:  Postoperative visit, For wound re-check   Contact information:   [address]  [place] [address] [phone]       Follow up In 2 weeks.      SignedJonnie Kind 01/12/2015, 7:41 AM

## 2015-01-13 NOTE — Progress Notes (Signed)
Pt called the front desk and stated that she is confused on what medication she is suppose to take.  Looking at providers discharge summary it states for the pt to "resume doxycycline and flagyl".  The pt's chart shows that Cipro was prescribed for the patient and the doxycycline was canceled on date of discharge.  For clarity notified Dr. Roselie Awkward, and pt was recommended to continue the Flagyl and to take the Cipro as prescribed; discontinue use of doxycyline.  Pt stated understanding with no further questions.

## 2015-01-16 ENCOUNTER — Telehealth: Payer: Self-pay | Admitting: *Deleted

## 2015-01-16 NOTE — Telephone Encounter (Signed)
Patient called and stated that she is experiencing night sweats and difficulty sleeping as well as diarrhea. I spoke with Dr. Roselie Awkward about patients symptoms and he advised that she monitor her symptoms and see if they improve after finishing antibiotics. Advised patient to eat with the antibiotics and to eat yogurt. Pt is scheduled to come in on 01/22/15.

## 2015-01-22 ENCOUNTER — Encounter: Payer: Self-pay | Admitting: Obstetrics & Gynecology

## 2015-01-22 ENCOUNTER — Ambulatory Visit (INDEPENDENT_AMBULATORY_CARE_PROVIDER_SITE_OTHER): Payer: Medicaid Other | Admitting: Obstetrics & Gynecology

## 2015-01-22 VITALS — BP 89/47 | HR 60 | Temp 98.2°F | Ht 66.0 in | Wt 160.6 lb

## 2015-01-22 DIAGNOSIS — Z9889 Other specified postprocedural states: Secondary | ICD-10-CM

## 2015-01-22 MED ORDER — ZOLPIDEM TARTRATE 5 MG PO TABS: 5.0000 mg | ORAL_TABLET | Freq: Every evening | ORAL | Status: DC | PRN

## 2015-01-22 NOTE — Patient Instructions (Signed)
Abdominal Hysterectomy, Care After °These instructions give you information on caring for yourself after your procedure. Your doctor may also give you more specific instructions. Call your doctor if you have any problems or questions after your procedure.  °HOME CARE °It takes 4-6 weeks to recover from this surgery. Follow all of your doctor's instructions.  °· Only take medicines as told by your doctor. °· Change your bandage as told by your doctor. °· Return to your doctor to have your stitches taken out. °· Take showers for 2-3 weeks. Ask your doctor when it is okay to shower. °· Do not douche, use tampons, or have sex (intercourse) for at least 6 weeks or as told. °· Follow your doctor's advice about exercise, lifting objects, driving, and general activities. °· Get plenty of rest and sleep. °· Do not lift anything heavier than a gallon of milk (about 10 pounds [4.5 kilograms]) for the first month after surgery. °· Get back to your normal diet as told by your doctor. °· Do not drink alcohol until your doctor says it is okay. °· Take a medicine to help you poop (laxative) as told by your doctor. °· Eating foods high in fiber may help you poop. Eat a lot of raw fruits and vegetables, whole grains, and beans. °· Drink enough fluids to keep your pee (urine) clear or pale yellow. °· Have someone help you at home for 1-2 weeks after your surgery. °· Keep follow-up doctor visits as told. °GET HELP IF: °· You have chills or fever. °· You have puffiness, redness, or pain in area of the cut (incision). °· You have yellowish-white fluid (pus) coming from the cut. °· You have a bad smell coming from the cut or bandage. °· Your cut pulls apart. °· You feel dizzy or light-headed. °· You have pain or bleeding when you pee. °· You keep having watery poop (diarrhea). °· You keep feeling sick to your stomach (nauseous) or keep throwing up (vomiting). °· You have fluid (discharge) coming from your vagina. °· You have a  rash. °· You have a reaction to your medicine. °· You need stronger pain medicine. °GET HELP RIGHT AWAY IF:  °· You have a fever and your symptoms suddenly get worse. °· You have bad belly (abdominal) pain. °· You have chest pain. °· You are short of breath. °· You pass out (faint). °· You have pain, puffiness, or redness of your leg. °· You bleed a lot from your vagina and notice clumps of tissue (clots). °MAKE SURE YOU:  °· Understand these instructions. °· Will watch your condition. °· Will get help right away if you are not doing well or get worse. °  °This information is not intended to replace advice given to you by your health care provider. Make sure you discuss any questions you have with your health care provider. °  °Document Released: 10/06/2007 Document Revised: 01/01/2013 Document Reviewed: 10/19/2012 °Elsevier Interactive Patient Education ©2016 Elsevier Inc. ° °

## 2015-01-22 NOTE — Progress Notes (Signed)
Subjective:diarrhea, insomnia, fatigue     Maria Logan is a 50 y.o. female who presents to the clinic 4 weeks status post total abdominal hysterectomy for abnormal uterine bleeding and fibroids. Eating a regular diet without difficulty. Bowel movements are abnormal with diarrhea. The patient is not having any pain. she was admitted twice for postoperative cuff infection and she stopped ciprofloxacin today.   The following portions of the patient's history were reviewed and updated as appropriate: allergies, current medications, past family history, past medical history, past social history, past surgical history and problem list.  Review of Systems Gastrointestinal: positive for diarrhea Genitourinary:positive for vaginal discharge    Objective:    BP 89/47 mmHg  Pulse 60  Temp(Src) 98.2 F (36.8 C)  Ht 5\' 6"  (1.676 m)  Wt 160 lb 9.6 oz (72.848 kg)  BMI 25.93 kg/m2  LMP 12/09/2014 (Approximate) General:  alert, cooperative and no distress  Abdomen: soft, bowel sounds active, non-tender  Incision:   healing well, no drainage, no erythema, no hernia, no seroma, no swelling, no dehiscence, incision well approximated     Pelvic exam: VULVA: normal appearing vulva with no masses, tenderness or lesions, VAGINA: vaginal discharge - white, cuff intact minimal tenderness on exam no mass Small external hemorrhoid.  Assessment:    Postoperative course complicated by diarrhea and insomnia likely due to antibiotic Operative findings again reviewed. Pathology report discussed.    Plan:    1. Continue any current medications. 2. Wound care discussed. 3. Activity restrictions: pelvic rest until 6 week postop 4. Anticipated return to work: 6 weeks postop. Woodroe Mode, MD 01/22/2015

## 2015-01-27 ENCOUNTER — Telehealth: Payer: Self-pay | Admitting: *Deleted

## 2015-01-27 ENCOUNTER — Encounter: Payer: Self-pay | Admitting: *Deleted

## 2015-01-27 DIAGNOSIS — Z0000 Encounter for general adult medical examination without abnormal findings: Secondary | ICD-10-CM

## 2015-01-27 NOTE — Telephone Encounter (Signed)
Pt left message stating that she had surgery on 12/13.  She would like a letter from Dr. Roselie Awkward stating that she may return to work. 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 I returned call to pt and

## 2015-01-29 NOTE — Telephone Encounter (Signed)
Called pt and pt stated that she is having night sweats and also she went to hospital and was freezing.  She stated that it took her over an hour to warm up.  Pt asked if it could be related to her thyroid.  I informed pt that it may, pt confirmed that she has not a diagnoses of thyroid issues.  I advised pt to see a provider for evaluation.  Scheduled appt for 02/05/15 @ 1445.  Also sent referral for a PCP and given pt contact info for [facility].  Pt stated understanding with no further questions.

## 2015-01-30 ENCOUNTER — Ambulatory Visit
Admission: RE | Admit: 2015-01-30 | Discharge: 2015-01-30 | Disposition: A | Discharge status: Home / Self Care | Payer: Medicaid Other | Source: Ambulatory Visit | Attending: Obstetrics & Gynecology | Admitting: Obstetrics & Gynecology

## 2015-01-30 DIAGNOSIS — Z1231 Encounter for screening mammogram for malignant neoplasm of breast: Secondary | ICD-10-CM

## 2015-02-02 ENCOUNTER — Other Ambulatory Visit: Payer: Self-pay | Admitting: Obstetrics & Gynecology

## 2015-02-02 DIAGNOSIS — R928 Other abnormal and inconclusive findings on diagnostic imaging of breast: Secondary | ICD-10-CM

## 2015-02-03 ENCOUNTER — Other Ambulatory Visit: Payer: Self-pay | Admitting: Obstetrics & Gynecology

## 2015-02-05 ENCOUNTER — Ambulatory Visit (INDEPENDENT_AMBULATORY_CARE_PROVIDER_SITE_OTHER): Payer: Medicaid Other | Admitting: Obstetrics & Gynecology

## 2015-02-05 ENCOUNTER — Encounter: Payer: Self-pay | Admitting: Obstetrics & Gynecology

## 2015-02-05 VITALS — BP 116/73 | HR 75 | Temp 97.6°F | Wt 162.5 lb

## 2015-02-05 DIAGNOSIS — R61 Generalized hyperhidrosis: Secondary | ICD-10-CM | POA: Insufficient documentation

## 2015-02-05 LAB — TSH: TSH: 0.937 u[IU]/mL (ref 0.350–4.500)

## 2015-02-05 LAB — FOLLICLE STIMULATING HORMONE: FSH: 7.2 m[IU]/mL

## 2015-02-05 MED ORDER — ESTRADIOL 1 MG PO TABS: 1.0000 mg | ORAL_TABLET | Freq: Every day | ORAL | Status: DC

## 2015-02-05 NOTE — Patient Instructions (Signed)
Hormone Therapy At menopause, your body begins making less estrogen and progesterone hormones. This causes the body to stop having menstrual periods. This is because estrogen and progesterone hormones control your periods and menstrual cycle. A lack of estrogen may cause symptoms such as:  Hot flushes (or hot flashes).  Vaginal dryness.  Dry skin.  Loss of sex drive.  Risk of bone loss (osteoporosis). When this happens, you may choose to take hormone therapy to get back the estrogen lost during menopause. When the hormone estrogen is given alone, it is usually referred to as ET (Estrogen Therapy). When the hormone progestin is combined with estrogen, it is generally called HT (Hormone Therapy). This was formerly known as hormone replacement therapy (HRT). Your caregiver can help you make a decision on what will be best for you. The decision to use HT seems to change often as new studies are done. Many studies do not agree on the benefits of hormone replacement therapy. LIKELY BENEFITS OF HT INCLUDE PROTECTION FROM:  Hot Flushes (also called hot flashes) - A hot flush is a sudden feeling of heat that spreads over the face and body. The skin may redden like a blush. It is connected with sweats and sleep disturbance. Women going through menopause may have hot flushes a few times a month or several times per day depending on the woman.  Osteoporosis (bone loss) - Estrogen helps guard against bone loss. After menopause, a woman's bones slowly lose calcium and become weak and brittle. As a result, bones are more likely to break. The hip, wrist, and spine are affected most often. Hormone therapy can help slow bone loss after menopause. Weight bearing exercise and taking calcium with vitamin D also can help prevent bone loss. There are also medications that your caregiver can prescribe that can help prevent osteoporosis.  Vaginal dryness - Loss of estrogen causes changes in the vagina. Its lining may  become thin and dry. These changes can cause pain and bleeding during sexual intercourse. Dryness can also lead to infections. This can cause burning and itching. (Vaginal estrogen treatment can help relieve pain, itching, and dryness.)  Urinary tract infections are more common after menopause because of lack of estrogen. Some women also develop urinary incontinence because of low estrogen levels in the vagina and bladder.  Possible other benefits of estrogen include a positive effect on mood and short-term memory in women. RISKS AND COMPLICATIONS  Using estrogen alone without progesterone causes the lining of the uterus to grow. This increases the risk of lining of the uterus (endometrial) cancer. Your caregiver should give another hormone called progestin if you have a uterus.  Women who take combined (estrogen and progestin) HT appear to have an increased risk of breast cancer. The risk appears to be small, but increases throughout the time that HT is taken.  Combined therapy also makes the breast tissue slightly denser which makes it harder to read mammograms (breast X-rays).  Combined, estrogen and progesterone therapy can be taken together every day, in which case there may be spotting of blood. HT therapy can be taken cyclically in which case you will have menstrual periods. Cyclically means HT is taken for a set amount of days, then not taken, then this process is repeated.  HT may increase the risk of stroke, heart attack, breast cancer and forming blood clots in your leg.  Transdermal estrogen (estrogen that is absorbed through the skin with a patch or a cream) may have better results with:  Cholesterol.  Blood pressure.  Blood clots. Having the following conditions may indicate you should not have HT:  Endometrial cancer.  Liver disease.  Breast cancer.  Heart disease.  History of blood clots.  Stroke. TREATMENT   If you choose to take HT and have a uterus, usually  estrogen and progestin are prescribed.  Your caregiver will help you decide the best way to take the medications.  Possible ways to take estrogen include:  Pills.  Patches.  Gels.  Sprays.  Vaginal estrogen cream, rings and tablets.  It is best to take the lowest dose possible that will help your symptoms and take them for the shortest period of time that you can.  Hormone therapy can help relieve some of the problems (symptoms) that affect women at menopause. Before making a decision about HT, talk to your caregiver about what is best for you. Be well informed and comfortable with your decisions. HOME CARE INSTRUCTIONS   Follow your caregivers advice when taking the medications.  A Pap test is done to screen for cervical cancer.  The first Pap test should be done at age 34.  Between ages 80 and 52, Pap tests are repeated every 2 years.  Beginning at age 13, you are advised to have a Pap test every 3 years as long as the past 3 Pap tests have been normal.  Some women have medical problems that increase the chance of getting cervical cancer. Talk to your caregiver about these problems. It is especially important to talk to your caregiver if a new problem develops soon after your last Pap test. In these cases, your caregiver may recommend more frequent screening and Pap tests.  The above recommendations are the same for women who have or have not gotten the vaccine for HPV (human papillomavirus).  If you had a hysterectomy for a problem that was not a cancer or a condition that could lead to cancer, then you no longer need Pap tests. However, even if you no longer need a Pap test, a regular exam is a good idea to make sure no other problems are starting.  If you are between ages 20 and 60, and you have had normal Pap tests going back 10 years, you no longer need Pap tests. However, even if you no longer need a Pap test, a regular exam is a good idea to make sure no other problems  are starting.  If you have had past treatment for cervical cancer or a condition that could lead to cancer, you need Pap tests and screening for cancer for at least 20 years after your treatment.  If Pap tests have been discontinued, risk factors (such as a new sexual partner)need to be re-assessed to determine if screening should be resumed.  Some women may need screenings more often if they are at high risk for cervical cancer.  Get mammograms done as per the advice of your caregiver. SEEK IMMEDIATE MEDICAL CARE IF:  You develop abnormal vaginal bleeding.  You have pain or swelling in your legs, shortness of breath, or chest pain.  You develop dizziness or headaches.  You have lumps or changes in your breasts or armpits.  You have slurred speech.  You develop weakness or numbness of your arms or legs.  You have pain, burning, or bleeding when urinating.  You develop abdominal pain.   This information is not intended to replace advice given to you by your health care provider. Make sure you discuss any questions  you have with your health care provider.   Document Released: 09/25/2002 Document Revised: 05/13/2014 Document Reviewed: 06/30/2014 Elsevier Interactive Patient Education 2016 Elsevier Inc.  

## 2015-02-05 NOTE — Progress Notes (Signed)
Patient ID: Maria Logan, female   DOB: November 07, 1965, 50 y.o.   MRN: [id number]  Chief Complaint  Patient presents with  . Night Sweats    HPI Maria Logan is a 50 y.o. female.  [id number] Notes night sweats and hot flushes and chills. No pain or discharge.  HPI  Past Medical History  Diagnosis Date  . Fibroids   . Vaginal delivery 1988, 1990, 2005    Past Surgical History  Procedure Laterality Date  . Examination under anesthesia N/A 06/04/2014    Procedure: EXAM UNDER ANESTHESIA;  Surgeon: Guss Bunde, MD;  Location: [facility];  Service: Gynecology;  Laterality: N/A;  . Operative ultrasound N/A 06/04/2014    Procedure: OPERATIVE ULTRASOUND;  Surgeon: Guss Bunde, MD;  Location: [facility];  Service: Gynecology;  Laterality: N/A;  . Abdominal hysterectomy N/A 12/23/2014    Procedure: HYSTERECTOMY ABDOMINAL;  Surgeon: Woodroe Mode, MD;  Location: [facility];  Service: Gynecology;  Laterality: N/A;  Requested 12/23/14 @ 1:00p  . Bilateral salpingectomy Bilateral 12/23/2014    Procedure: BILATERAL SALPINGECTOMY;  Surgeon: Woodroe Mode, MD;  Location: [facility];  Service: Gynecology;  Laterality: Bilateral;    Family History  Problem Relation Age of Onset  . Alcohol abuse Neg Hx   . Arthritis Neg Hx   . Asthma Neg Hx   . Birth defects Neg Hx   . Cancer Neg Hx   . COPD Neg Hx   . Depression Neg Hx   . Diabetes Neg Hx   . Drug abuse Neg Hx   . Early death Neg Hx   . Hearing loss Neg Hx   . Heart disease Neg Hx   . Hyperlipidemia Neg Hx   . Hypertension Neg Hx   . Kidney disease Neg Hx   . Learning disabilities Neg Hx   . Mental illness Neg Hx   . Mental retardation Neg Hx   . Miscarriages / Stillbirths Neg Hx   . Stroke Neg Hx   . Vision loss Neg Hx   . Varicose Veins Neg Hx     Social History Social History  Substance Use Topics  . Smoking status: Never Smoker   . Smokeless tobacco: Never Used  . Alcohol Use: No    No Known Allergies  Current Outpatient  Prescriptions  Medication Sig Dispense Refill  . ibuprofen (ADVIL,MOTRIN) 600 MG tablet Take 1 tablet (600 mg total) by mouth every 6 (six) hours as needed for moderate pain or cramping. 30 tablet 0  . VOLTAREN 1 % GEL Reported on 02/05/2015  1  . ciprofloxacin (CIPRO) 500 MG tablet Take 1 tablet (500 mg total) by mouth 2 (two) times daily. (Patient not taking: Reported on 02/05/2015) 20 tablet 0  . docusate sodium (COLACE) 100 MG capsule Take 1 capsule (100 mg total) by mouth 2 (two) times daily. (Patient not taking: Reported on 01/22/2015) 10 capsule 0  . estradiol (ESTRACE) 1 MG tablet Take 1 tablet (1 mg total) by mouth daily. 30 tablet 6  . metroNIDAZOLE (FLAGYL) 500 MG tablet Take 1 tablet (500 mg total) by mouth 2 (two) times daily. (Patient not taking: Reported on 02/05/2015) 28 tablet 0  . oxyCODONE-acetaminophen (PERCOCET/ROXICET) 5-325 MG tablet Take 1-2 tablets by mouth every 3 (three) hours as needed (moderate to severe pain (when tolerating fluids)). (Patient not taking: Reported on 02/05/2015) 30 tablet 0  . zolpidem (AMBIEN) 5 MG tablet Take 1 tablet (5 mg total) by mouth at bedtime as  needed for sleep. (Patient not taking: Reported on 02/05/2015) 10 tablet 0   No current facility-administered medications for this visit.    Review of Systems Review of Systems  Constitutional: Negative for fever.  Endocrine: Positive for cold intolerance and heat intolerance.       Sweats  Genitourinary: Negative for vaginal bleeding, vaginal discharge and pelvic pain.    Blood pressure 116/73, pulse 75, temperature 97.6 F (36.4 C), temperature source Oral, weight 162 lb 8 oz (73.71 kg), last menstrual period 12/09/2014.  Physical Exam Physical Exam  Constitutional: She is oriented to person, place, and time. She appears well-developed. No distress.  Cardiovascular: Normal rate.   Pulmonary/Chest: Effort normal.  Neurological: She is alert and oriented to person, place, and time.  Skin: Skin  is warm and dry.  Psychiatric: She has a normal mood and affect. Her behavior is normal.      Assessment    Suspect VMS associated with perimenopausal ovarian failure, will screen for thyroid dysfunction     Plan    Check TSH and FSH Estrace 1 mg PO daily        Hussain Maimone 02/05/2015, 4:45 PM

## 2015-02-06 ENCOUNTER — Ambulatory Visit
Admission: RE | Admit: 2015-02-06 | Discharge: 2015-02-06 | Disposition: A | Discharge status: Home / Self Care | Payer: Medicaid Other | Source: Ambulatory Visit | Attending: Obstetrics & Gynecology | Admitting: Obstetrics & Gynecology

## 2015-02-06 ENCOUNTER — Telehealth: Payer: Self-pay | Admitting: *Deleted

## 2015-02-06 DIAGNOSIS — R928 Other abnormal and inconclusive findings on diagnostic imaging of breast: Secondary | ICD-10-CM

## 2015-02-06 NOTE — Telephone Encounter (Signed)
Called pt and informed her that Dr. Roselie Awkward has reviewed her results. The hormone levels are normal. He wants her to continue taking the estradiol and report back if she is not improved. Pt agreed and voiced understanding.

## 2015-02-25 ENCOUNTER — Emergency Department (HOSPITAL_COMMUNITY): Payer: Medicaid Other

## 2015-02-25 ENCOUNTER — Encounter (HOSPITAL_COMMUNITY): Payer: Self-pay | Admitting: *Deleted

## 2015-02-25 ENCOUNTER — Emergency Department (HOSPITAL_COMMUNITY)
Admission: EM | Admit: 2015-02-25 | Discharge: 2015-02-25 | Disposition: A | Discharge status: Home / Self Care | Payer: Medicaid Other | Attending: Physician Assistant | Admitting: Physician Assistant

## 2015-02-25 DIAGNOSIS — R197 Diarrhea, unspecified: Secondary | ICD-10-CM | POA: Insufficient documentation

## 2015-02-25 DIAGNOSIS — Z86018 Personal history of other benign neoplasm: Secondary | ICD-10-CM | POA: Insufficient documentation

## 2015-02-25 DIAGNOSIS — Z79818 Long term (current) use of other agents affecting estrogen receptors and estrogen levels: Secondary | ICD-10-CM | POA: Insufficient documentation

## 2015-02-25 DIAGNOSIS — N739 Female pelvic inflammatory disease, unspecified: Secondary | ICD-10-CM | POA: Diagnosis not present

## 2015-02-25 DIAGNOSIS — Z79899 Other long term (current) drug therapy: Secondary | ICD-10-CM | POA: Diagnosis not present

## 2015-02-25 DIAGNOSIS — Z90710 Acquired absence of both cervix and uterus: Secondary | ICD-10-CM | POA: Insufficient documentation

## 2015-02-25 DIAGNOSIS — R112 Nausea with vomiting, unspecified: Secondary | ICD-10-CM | POA: Diagnosis present

## 2015-02-25 LAB — COMPREHENSIVE METABOLIC PANEL
ALBUMIN: 3.7 g/dL (ref 3.5–5.0)
ALT: 26 U/L (ref 14–54)
ANION GAP: 7 (ref 5–15)
AST: 55 U/L — ABNORMAL HIGH (ref 15–41)
Alkaline Phosphatase: 73 U/L (ref 38–126)
BUN: 10 mg/dL (ref 6–20)
CO2: 27 mmol/L (ref 22–32)
Calcium: 9.2 mg/dL (ref 8.9–10.3)
Chloride: 103 mmol/L (ref 101–111)
Creatinine, Ser: 0.98 mg/dL (ref 0.44–1.00)
GFR calc Af Amer: >60 mL/min (ref >60)
GFR calc non Af Amer: >60 mL/min (ref >60)
GLUCOSE: 92 mg/dL (ref 65–99)
POTASSIUM: 3.7 mmol/L (ref 3.5–5.1)
SODIUM: 137 mmol/L (ref 135–145)
TOTAL PROTEIN: 7.6 g/dL (ref 6.5–8.1)
Total Bilirubin: 0.4 mg/dL (ref 0.3–1.2)

## 2015-02-25 LAB — URINALYSIS, ROUTINE W REFLEX MICROSCOPIC
BILIRUBIN URINE: NEGATIVE
Glucose, UA: NEGATIVE mg/dL
Ketones, ur: NEGATIVE mg/dL
Leukocytes, UA: NEGATIVE
NITRITE: NEGATIVE
PH: 5 (ref 5.0–8.0)
Protein, ur: NEGATIVE mg/dL
SPECIFIC GRAVITY, URINE: 1.017 (ref 1.005–1.030)

## 2015-02-25 LAB — CBC
HEMATOCRIT: 36.7 % (ref 36.0–46.0)
HEMOGLOBIN: 11.7 g/dL — AB (ref 12.0–15.0)
MCH: 29.2 pg (ref 26.0–34.0)
MCHC: 31.9 g/dL (ref 30.0–36.0)
MCV: 91.5 fL (ref 78.0–100.0)
Platelets: 285 10*3/uL (ref 150–400)
RBC: 4.01 MIL/uL (ref 3.87–5.11)
RDW: 13.2 % (ref 11.5–15.5)
WBC: 5.3 10*3/uL (ref 4.0–10.5)

## 2015-02-25 LAB — URINE MICROSCOPIC-ADD ON: WBC UA: NONE SEEN WBC/hpf (ref 0–5)

## 2015-02-25 LAB — LIPASE, BLOOD: Lipase: 37 U/L (ref 11–51)

## 2015-02-25 MED ORDER — SODIUM CHLORIDE 0.9 % IV BOLUS (SEPSIS)
1000.0000 mL | Freq: Once | INTRAVENOUS | Status: AC
  Administered 2015-02-25: 1000 mL via INTRAVENOUS

## 2015-02-25 MED ORDER — IOHEXOL 300 MG/ML  SOLN
100.0000 mL | Freq: Once | INTRAMUSCULAR | Status: AC | PRN
  Administered 2015-02-25: 100 mL via INTRAVENOUS

## 2015-02-25 MED ORDER — ONDANSETRON HCL 4 MG PO TABS: 4.0000 mg | ORAL_TABLET | Freq: Three times a day (TID) | ORAL | Status: DC | PRN

## 2015-02-25 MED ORDER — IBUPROFEN 800 MG PO TABS
800.0000 mg | ORAL_TABLET | Freq: Once | ORAL | Status: AC
  Administered 2015-02-25: 800 mg via ORAL
  Filled 2015-02-25: qty 1

## 2015-02-25 MED ORDER — METRONIDAZOLE 500 MG PO TABS
500.0000 mg | ORAL_TABLET | Freq: Once | ORAL | Status: AC
  Administered 2015-02-25: 500 mg via ORAL
  Filled 2015-02-25: qty 1

## 2015-02-25 MED ORDER — IOHEXOL 300 MG/ML  SOLN
50.0000 mL | Freq: Once | INTRAMUSCULAR | Status: AC | PRN
  Administered 2015-02-25: 50 mL via ORAL

## 2015-02-25 MED ORDER — METRONIDAZOLE 500 MG PO TABS: 500.0000 mg | ORAL_TABLET | Freq: Two times a day (BID) | ORAL | Status: DC

## 2015-02-25 MED ORDER — ONDANSETRON 4 MG PO TBDP
4.0000 mg | ORAL_TABLET | Freq: Once | ORAL | Status: AC | PRN
  Administered 2015-02-25: 4 mg via ORAL
  Filled 2015-02-25: qty 1

## 2015-02-25 MED ORDER — CIPROFLOXACIN HCL 500 MG PO TABS
500.0000 mg | ORAL_TABLET | Freq: Once | ORAL | Status: AC
  Administered 2015-02-25: 500 mg via ORAL
  Filled 2015-02-25: qty 1

## 2015-02-25 MED ORDER — CIPROFLOXACIN HCL 500 MG PO TABS: 500.0000 mg | ORAL_TABLET | Freq: Two times a day (BID) | ORAL | Status: DC

## 2015-02-25 NOTE — ED Notes (Signed)
Pt reports diarrhea, nausea and rt hand tingling/numbness x2 weeks - pt also admits to RLQ pain as well. Denies vomiting or fever.

## 2015-02-25 NOTE — ED Notes (Signed)
Pt expresses concern over being on prescribed the same antibiotics as last time she had this condition; Pt states she does not want to have to be admitted to the hospital if these medications don't work again; This nurse explained that the MD consulted the OBGYN specialist when prescribing these drugs and that they are "the best there is for treating that" (per MD); pt remained anxious and concerned about being discharged; MD notified and verbalizes she will speak with pt

## 2015-02-25 NOTE — Progress Notes (Signed)
Entered in d/c instructions  Osei-Bonsu, Iona Beard Schedule an appointment as soon as possible for a visit This is your assigned Medicaid [place] access doctor If you prefer another contact [place] assigned your doctor *You may receive a bill if you go to any family Dr not assigned to you [place] [address] [phone] Medicaid [place] Access Covered Patient [place]: [place], [place] [address] [email or web address] Use this website to assist with understanding your coverage & to renew application As a Medicaid client you MUST contact DSS/SSI each time you change address, move to another [place] or another state to keep your address updated  [place] Medicaid Transportation to Dr appts if you are have full Medicaid: [phone], [phone]

## 2015-02-25 NOTE — Discharge Instructions (Signed)
Your CAT scan showed a small collection of fluid in your abdomen. It is possible this is an abscess. We'll treat with antibiotics. Will hjave  follow-up with Dr. Roselie Awkward. Please return if you have high fevers or other concerns.

## 2015-02-25 NOTE — ED Provider Notes (Addendum)
CSN: [id number]     Arrival date & time 02/25/15  1141 History   First MD Initiated Contact with Patient 02/25/15 1658     Chief Complaint  Patient presents with  . Diarrhea  . Nausea  . Tingling     (Consider location/radiation/quality/duration/timing/severity/associated sxs/prior Treatment) HPI    Patient is a 50 year old female presenting with diarrhea and nausea vomiting and abdominal pain in the right lower quadrant. Patient's past medical history is significant for a hysterectomy which is Located by an intra-abdominal abscess. This was resolved in December 2016, 3 months ago.  Patient reports 2-3 days of nausea vomiting diarrhea. Patient reports that she's had many sick contacts with the same thing. She denies any urinary symptoms. Denies any flank pain.  Patient has been able to tolerate food and drink at home. No fevers.  Past Medical History  Diagnosis Date  . Fibroids   . Vaginal delivery 1988, 1990, 2005   Past Surgical History  Procedure Laterality Date  . Examination under anesthesia N/A 06/04/2014    Procedure: EXAM UNDER ANESTHESIA;  Surgeon: Guss Bunde, MD;  Location: [facility];  Service: Gynecology;  Laterality: N/A;  . Operative ultrasound N/A 06/04/2014    Procedure: OPERATIVE ULTRASOUND;  Surgeon: Guss Bunde, MD;  Location: [facility];  Service: Gynecology;  Laterality: N/A;  . Abdominal hysterectomy N/A 12/23/2014    Procedure: HYSTERECTOMY ABDOMINAL;  Surgeon: Woodroe Mode, MD;  Location: [facility];  Service: Gynecology;  Laterality: N/A;  Requested 12/23/14 @ 1:00p  . Bilateral salpingectomy Bilateral 12/23/2014    Procedure: BILATERAL SALPINGECTOMY;  Surgeon: Woodroe Mode, MD;  Location: [facility];  Service: Gynecology;  Laterality: Bilateral;   Family History  Problem Relation Age of Onset  . Alcohol abuse Neg Hx   . Arthritis Neg Hx   . Asthma Neg Hx   . Birth defects Neg Hx   . Cancer Neg Hx   . COPD Neg Hx   . Depression Neg Hx   . Diabetes Neg  Hx   . Drug abuse Neg Hx   . Early death Neg Hx   . Hearing loss Neg Hx   . Heart disease Neg Hx   . Hyperlipidemia Neg Hx   . Hypertension Neg Hx   . Kidney disease Neg Hx   . Learning disabilities Neg Hx   . Mental illness Neg Hx   . Mental retardation Neg Hx   . Miscarriages / Stillbirths Neg Hx   . Stroke Neg Hx   . Vision loss Neg Hx   . Varicose Veins Neg Hx    Social History  Substance Use Topics  . Smoking status: Never Smoker   . Smokeless tobacco: Never Used  . Alcohol Use: No   OB History    Gravida Para Term Preterm AB TAB SAB Ectopic Multiple Living   3 3 3   0  0   3     Review of Systems  Constitutional: Negative for fever, activity change and fatigue.  HENT: Negative for congestion.   Respiratory: Negative for shortness of breath.   Cardiovascular: Negative for chest pain.  Gastrointestinal: Positive for vomiting, abdominal pain and diarrhea.  Genitourinary: Negative for dysuria, frequency and flank pain.  Musculoskeletal: Negative for back pain.  Neurological: Negative for dizziness.  Psychiatric/Behavioral: Negative for agitation.  All other systems reviewed and are negative.     Allergies  Review of patient's allergies indicates no known allergies.  Home Medications   Prior to  Admission medications   Medication Sig Start Date End Date Taking? Authorizing Provider  estradiol (ESTRACE) 1 MG tablet Take 1 tablet (1 mg total) by mouth daily. 02/05/15  Yes Woodroe Mode, MD  ibuprofen (ADVIL,MOTRIN) 600 MG tablet Take 1 tablet (600 mg total) by mouth every 6 (six) hours as needed for moderate pain or cramping. 12/30/14  Yes Lori A Clemmons, CNM  Multiple Vitamin (MULTIVITAMIN WITH MINERALS) TABS tablet Take 1 tablet by mouth daily.   Yes Historical Provider, MD  VOLTAREN 1 % GEL Apply to left knee up to 4 times daily as needed for pain. 12/05/14  Yes Historical Provider, MD  ciprofloxacin (CIPRO) 500 MG tablet Take 1 tablet (500 mg total) by mouth 2  (two) times daily. Patient not taking: Reported on 02/05/2015 01/12/15   Jonnie Kind, MD  docusate sodium (COLACE) 100 MG capsule Take 1 capsule (100 mg total) by mouth 2 (two) times daily. Patient not taking: Reported on 01/22/2015 12/27/14   Gareth Morgan, MD  metroNIDAZOLE (FLAGYL) 500 MG tablet Take 1 tablet (500 mg total) by mouth 2 (two) times daily. Patient not taking: Reported on 02/05/2015 01/03/15   Mora Bellman, MD  oxyCODONE-acetaminophen (PERCOCET/ROXICET) 5-325 MG tablet Take 1-2 tablets by mouth every 3 (three) hours as needed (moderate to severe pain (when tolerating fluids)). Patient not taking: Reported on 02/05/2015 01/12/15   Jonnie Kind, MD  zolpidem (AMBIEN) 5 MG tablet Take 1 tablet (5 mg total) by mouth at bedtime as needed for sleep. Patient not taking: Reported on 02/05/2015 01/22/15 02/21/15  Woodroe Mode, MD   BP 112/64 mmHg  Pulse 63  Temp(Src) 97.8 F (36.6 C) (Oral)  Resp 20  SpO2 100%  LMP 12/09/2014 (Approximate) Physical Exam  Constitutional: She is oriented to person, place, and time. She appears well-developed and well-nourished.  HENT:  Head: Normocephalic and atraumatic.  Eyes: Conjunctivae are normal. Right eye exhibits no discharge.  Neck: Neck supple.  Cardiovascular: Normal rate, regular rhythm and normal heart sounds.   No murmur heard. Pulmonary/Chest: Effort normal and breath sounds normal. She has no wheezes. She has no rales.  Abdominal: Soft. She exhibits no distension. There is tenderness.  Patient has right lower quadrant pain/right suprapubic pain.  Musculoskeletal: Normal range of motion. She exhibits no edema.  Neurological: She is oriented to person, place, and time. No cranial nerve deficit.  Skin: Skin is warm and dry. No rash noted. She is not diaphoretic.  Psychiatric: She has a normal mood and affect.  Nursing note and vitals reviewed.   ED Course  Procedures (including critical care time) Labs Review Labs Reviewed   COMPREHENSIVE METABOLIC PANEL - Abnormal; Notable for the following:    AST 55 (*)    All other components within normal limits  CBC - Abnormal; Notable for the following:    Hemoglobin 11.7 (*)    All other components within normal limits  URINALYSIS, ROUTINE W REFLEX MICROSCOPIC (NOT AT [facility]) - Abnormal; Notable for the following:    Hgb urine dipstick LARGE (*)    All other components within normal limits  URINE MICROSCOPIC-ADD ON - Abnormal; Notable for the following:    Squamous Epithelial / LPF 0-5 (*)    Bacteria, UA RARE (*)    All other components within normal limits  LIPASE, BLOOD    Imaging Review No results found. I have personally reviewed and evaluated these images and lab results as part of my medical decision-making.   EKG  Interpretation None      MDM   Final diagnoses:  None    Patient is a 50 year old female presenting with nausea vomiting diarrhea. The symptoms are consistent with viral gastroenteritis that has been going around.  Patient does have tenderness in the right lower quadrant. Given her history of abscess and after hysterectomy would like to do CT abdomen to make sure there is no further infection.    7:30 CT shows? Abscess.  7:47 PM Discussed with OB, they do not want TVUS.  Will put on Cipro and Flagyl and have her follow up with Dr. Roselie Awkward. Pt white count only 5, and afebrile.   Vital signs still stable.  Will PO challenge.   Katharina Jehle Julio Alm, MD 02/25/15 1950  [place], MD 02/25/15 2150

## 2015-02-26 ENCOUNTER — Telehealth: Payer: Self-pay | Admitting: General Practice

## 2015-02-26 NOTE — Telephone Encounter (Signed)
Patient called and left message stating she was in the ER last night and has another abscess. Patient is calling and wants to know what we are going to do for her. Called patient back and she states she is very frustrated & upset. Patient states she has another abscess and is continuing to have diarrhea even though she's been off antibiotics for a while now. Patient states now we have her back on antibiotics again that caused the diarrhea in the first place. Patient states she would rather be admitted to the hospital now and have this thing drained then to go through everything she did last time. Offered Monday appt 2/20 @ 1:30 for patient with Dr Roselie Awkward. Patient states so am I just supposed to wait around all weekend and possibly getting worse. Patient states what am I supposed to do go to the ER again over the weekend. Asked patient what we could do for her today to reassure her. Patient yelled I didn't go to school you did. You tell me what you are going to do. Told patient I hear her frustration and wanting to feel better. Told patient we are getting her worked in as soon as we can and would not be offering that appt to anyone else & that this is an urgent work in so we can get her feeling better. Patient asked what time. Told patient 2/20 @ 1:30. Patient said yeah fine and hung up the phone.

## 2015-03-02 ENCOUNTER — Ambulatory Visit: Payer: Medicaid Other | Admitting: Obstetrics & Gynecology

## 2015-03-02 ENCOUNTER — Encounter: Payer: Self-pay | Admitting: Obstetrics & Gynecology

## 2015-03-02 VITALS — BP 93/73 | HR 69 | Temp 98.3°F | Wt 167.2 lb

## 2015-03-02 DIAGNOSIS — R198 Other specified symptoms and signs involving the digestive system and abdomen: Secondary | ICD-10-CM

## 2015-03-02 MED ORDER — DICYCLOMINE HCL 20 MG PO TABS: 20.0000 mg | ORAL_TABLET | Freq: Three times a day (TID) | ORAL | Status: DC

## 2015-03-02 NOTE — Patient Instructions (Signed)

## 2015-03-02 NOTE — Progress Notes (Signed)
Patient ID: Maria Logan, female   DOB: November 17, 1965, 50 y.o.   MRN: [id number]  Chief Complaint  Patient presents with  . Pelvic Inflammatory Disease  . Diarrhea    HPI Maria Logan is a 50 y.o. female.  S/p TAH BS 9 weeks ago, states she continues to have diarrhea abdominal pain and nausea weeks after treatment for postoperative abscess. Also has RLQ pain and dyspareunia. She was evaluated in [facility] 3 days ago and CT showed pelvic fluid collection.  HPI  Past Medical History  Diagnosis Date  . Fibroids   . Vaginal delivery 1988, 1990, 2005    Past Surgical History  Procedure Laterality Date  . Examination under anesthesia N/A 06/04/2014    Procedure: EXAM UNDER ANESTHESIA;  Surgeon: Guss Bunde, MD;  Location: [facility];  Service: Gynecology;  Laterality: N/A;  . Operative ultrasound N/A 06/04/2014    Procedure: OPERATIVE ULTRASOUND;  Surgeon: Guss Bunde, MD;  Location: [facility];  Service: Gynecology;  Laterality: N/A;  . Abdominal hysterectomy N/A 12/23/2014    Procedure: HYSTERECTOMY ABDOMINAL;  Surgeon: Woodroe Mode, MD;  Location: [facility];  Service: Gynecology;  Laterality: N/A;  Requested 12/23/14 @ 1:00p  . Bilateral salpingectomy Bilateral 12/23/2014    Procedure: BILATERAL SALPINGECTOMY;  Surgeon: Woodroe Mode, MD;  Location: [facility];  Service: Gynecology;  Laterality: Bilateral;    Family History  Problem Relation Age of Onset  . Alcohol abuse Neg Hx   . Arthritis Neg Hx   . Asthma Neg Hx   . Birth defects Neg Hx   . Cancer Neg Hx   . COPD Neg Hx   . Depression Neg Hx   . Diabetes Neg Hx   . Drug abuse Neg Hx   . Early death Neg Hx   . Hearing loss Neg Hx   . Heart disease Neg Hx   . Hyperlipidemia Neg Hx   . Hypertension Neg Hx   . Kidney disease Neg Hx   . Learning disabilities Neg Hx   . Mental illness Neg Hx   . Mental retardation Neg Hx   . Miscarriages / Stillbirths Neg Hx   . Stroke Neg Hx   . Vision loss Neg Hx   . Varicose Veins Neg Hx      Social History Social History  Substance Use Topics  . Smoking status: Never Smoker   . Smokeless tobacco: Never Used  . Alcohol Use: No    No Known Allergies  Current Outpatient Prescriptions  Medication Sig Dispense Refill  . ciprofloxacin (CIPRO) 500 MG tablet Take 1 tablet (500 mg total) by mouth 2 (two) times daily. 20 tablet 0  . estradiol (ESTRACE) 1 MG tablet Take 1 tablet (1 mg total) by mouth daily. 30 tablet 6  . ibuprofen (ADVIL,MOTRIN) 600 MG tablet Take 1 tablet (600 mg total) by mouth every 6 (six) hours as needed for moderate pain or cramping. 30 tablet 0  . metroNIDAZOLE (FLAGYL) 500 MG tablet Take 1 tablet (500 mg total) by mouth 2 (two) times daily. 20 tablet 0  . Multiple Vitamin (MULTIVITAMIN WITH MINERALS) TABS tablet Take 1 tablet by mouth daily.    . ondansetron (ZOFRAN) 4 MG tablet Take 1 tablet (4 mg total) by mouth every 8 (eight) hours as needed for nausea or vomiting. 11 tablet 0  . VOLTAREN 1 % GEL Apply to left knee up to 4 times daily as needed for pain.  1  . ciprofloxacin (CIPRO) 500  MG tablet Take 1 tablet (500 mg total) by mouth 2 (two) times daily. (Patient not taking: Reported on 02/05/2015) 20 tablet 0  . docusate sodium (COLACE) 100 MG capsule Take 1 capsule (100 mg total) by mouth 2 (two) times daily. (Patient not taking: Reported on 01/22/2015) 10 capsule 0  . metroNIDAZOLE (FLAGYL) 500 MG tablet Take 1 tablet (500 mg total) by mouth 2 (two) times daily. (Patient not taking: Reported on 02/05/2015) 28 tablet 0  . oxyCODONE-acetaminophen (PERCOCET/ROXICET) 5-325 MG tablet Take 1-2 tablets by mouth every 3 (three) hours as needed (moderate to severe pain (when tolerating fluids)). (Patient not taking: Reported on 02/05/2015) 30 tablet 0  . zolpidem (AMBIEN) 5 MG tablet Take 1 tablet (5 mg total) by mouth at bedtime as needed for sleep. (Patient not taking: Reported on 02/05/2015) 10 tablet 0   No current facility-administered medications for this  visit.    Review of Systems Review of Systems  Constitutional: Negative for fever.  Gastrointestinal: Positive for nausea, vomiting (rarely), abdominal pain and diarrhea.  Genitourinary: Positive for pelvic pain and dyspareunia. Negative for vaginal bleeding and vaginal discharge.    Blood pressure 93/73, pulse 69, temperature 98.3 F (36.8 C), weight 167 lb 3.2 oz (75.841 kg), last menstrual period 12/09/2014.  Physical Exam Physical Exam  Constitutional: She is oriented to person, place, and time. She appears well-developed. No distress.  Pulmonary/Chest: Effort normal.  Abdominal: Soft. She exhibits no mass. There is tenderness (RLQ).  Neurological: She is oriented to person, place, and time.  Skin: Skin is warm and dry.  Psychiatric: She has a normal mood and affect. Her behavior is normal.    Data Reviewed CLINICAL DATA: 50 year old female with right lower quadrant an suprapubic abdominal pain. History of pelvic abscess after hysterectomy.  EXAM: CT ABDOMEN AND PELVIS WITH CONTRAST  TECHNIQUE: Multidetector CT imaging of the abdomen and pelvis was performed using the standard protocol following bolus administration of intravenous contrast.  CONTRAST: 12mL OMNIPAQUE IOHEXOL 300 MG/ML SOLN, 28mL OMNIPAQUE IOHEXOL 300 MG/ML SOLN  COMPARISON: CT dated 01/09/2015  FINDINGS: The visualized lung bases are clear. No intra-abdominal free air. Trace free fluid within pelvis. Set  The liver, gallbladder, pancreas, spleen, and the adrenal glands appear unremarkable. There is a 2.8 cm left renal cyst. The kidneys are otherwise unremarkable. The visualized ureters and urinary bladder appear unremarkable.  Hysterectomy. There is mild diffuse haziness of the pelvic floor fat. There is a 5.2 x 3.2 cm walled-off complex fluid in the right posterior hemipelvis in the region of the previously seen ill-defined gas containing collection. This collection  demonstrates areas of higher attenuating content inferiorly. There is enhancement of the walls of this collection. Likely represents an abscess. A right ovarian hemorrhagic cyst is not excluded. Correlation with clinical exam and pelvic ultrasound is recommended.  There is moderate stool throughout the colon. No evidence of bowel obstruction or inflammation. Normal appendix.  The abdominal aorta and IVC appear unremarkable. No portal venous gas identified. There is no adenopathy. The there is a small fat containing umbilical hernia. Anterior pelvic wall surgical scar noted. The osseous structures are intact.  IMPRESSION: A 5.2 x 3.2 cm complex fluid collection with enhancing walls in the right posterior hemipelvis concerning for an abscess and less likely a right ovarian hemorrhagic cyst. Correlation with clinical exam and pelvic ultrasound is recommended.   Electronically Signed  By: Anner Crete M.D.  On: 02/25/2015 19:22   Assessment    post op TAH, after  abscess treated with N&V and diarrhea Pelvic fluid collection may be residual from abscess, doubt active due to nl WBC  r/o C diff Possible fxn bowel disease  Plan    Stool for c. Diff GI referral Continue abx as Rx Bentyl for spasms RTC 2 weeks        Justen Fonda 03/02/2015, 1:48 PM

## 2015-03-02 NOTE — Addendum Note (Signed)
Addended by: Woodroe Mode on: 03/02/2015 04:08 PM   Modules accepted: Orders

## 2015-03-03 ENCOUNTER — Telehealth: Payer: Self-pay | Admitting: Physician Assistant

## 2015-03-03 NOTE — Telephone Encounter (Signed)
I have left message for the patient to call back 

## 2015-03-04 ENCOUNTER — Telehealth: Payer: Self-pay | Admitting: Gastroenterology

## 2015-03-04 ENCOUNTER — Other Ambulatory Visit (INDEPENDENT_AMBULATORY_CARE_PROVIDER_SITE_OTHER): Payer: Medicaid Other

## 2015-03-04 ENCOUNTER — Telehealth: Payer: Self-pay | Admitting: *Deleted

## 2015-03-04 ENCOUNTER — Encounter: Payer: Self-pay | Admitting: Physician Assistant

## 2015-03-04 ENCOUNTER — Ambulatory Visit (INDEPENDENT_AMBULATORY_CARE_PROVIDER_SITE_OTHER): Payer: Medicaid Other | Admitting: Physician Assistant

## 2015-03-04 ENCOUNTER — Telehealth: Payer: Self-pay | Admitting: General Practice

## 2015-03-04 VITALS — BP 100/60 | HR 64 | Ht 64.0 in | Wt 166.2 lb

## 2015-03-04 DIAGNOSIS — R1031 Right lower quadrant pain: Secondary | ICD-10-CM

## 2015-03-04 DIAGNOSIS — N732 Unspecified parametritis and pelvic cellulitis: Secondary | ICD-10-CM

## 2015-03-04 DIAGNOSIS — T814XXA Infection following a procedure, initial encounter: Secondary | ICD-10-CM | POA: Diagnosis not present

## 2015-03-04 DIAGNOSIS — N739 Female pelvic inflammatory disease, unspecified: Secondary | ICD-10-CM

## 2015-03-04 DIAGNOSIS — T8140XA Infection following a procedure, unspecified, initial encounter: Secondary | ICD-10-CM

## 2015-03-04 DIAGNOSIS — R197 Diarrhea, unspecified: Secondary | ICD-10-CM | POA: Diagnosis not present

## 2015-03-04 LAB — CBC WITH DIFFERENTIAL/PLATELET
Basophils Absolute: 0.1 10*3/uL (ref 0.0–0.1)
Basophils Relative: 0.6 % (ref 0.0–3.0)
Eosinophils Absolute: 0.1 10*3/uL (ref 0.0–0.7)
Eosinophils Relative: 0.8 % (ref 0.0–5.0)
HCT: 37.2 % (ref 36.0–46.0)
Hemoglobin: 12.3 g/dL (ref 12.0–15.0)
LYMPHS ABS: 1.7 10*3/uL (ref 0.7–4.0)
Lymphocytes Relative: 20.6 % (ref 12.0–46.0)
MCHC: 33 g/dL (ref 30.0–36.0)
MCV: 88.3 fl (ref 78.0–100.0)
MONO ABS: 0.6 10*3/uL (ref 0.1–1.0)
Monocytes Relative: 7.1 % (ref 3.0–12.0)
NEUTROS ABS: 5.8 10*3/uL (ref 1.4–7.7)
NEUTROS PCT: 70.9 % (ref 43.0–77.0)
PLATELETS: 311 10*3/uL (ref 150.0–400.0)
RBC: 4.21 Mil/uL (ref 3.87–5.11)
RDW: 13.9 % (ref 11.5–15.5)
WBC: 8.2 10*3/uL (ref 4.0–10.5)

## 2015-03-04 LAB — BASIC METABOLIC PANEL
BUN: 10 mg/dL (ref 6–23)
CO2: 28 mEq/L (ref 19–32)
CREATININE: 0.91 mg/dL (ref 0.40–1.20)
Calcium: 9.2 mg/dL (ref 8.4–10.5)
Chloride: 104 mEq/L (ref 96–112)
GFR: 85.04 mL/min (ref >60.00)
Glucose, Bld: 81 mg/dL (ref 70–99)
Potassium: 4.1 mEq/L (ref 3.5–5.1)
Sodium: 137 mEq/L (ref 135–145)

## 2015-03-04 LAB — SEDIMENTATION RATE: SED RATE: 26 mm/h — AB (ref 0–22)

## 2015-03-04 LAB — C-REACTIVE PROTEIN: CRP: 0.1 mg/dL — AB (ref 0.5–20.0)

## 2015-03-04 MED ORDER — VANCOMYCIN HCL 250 MG PO CAPS: 250.0000 mg | ORAL_CAPSULE | Freq: Four times a day (QID) | ORAL | Status: DC

## 2015-03-04 MED ORDER — SACCHAROMYCES BOULARDII 250 MG PO CAPS: 250.0000 mg | ORAL_CAPSULE | Freq: Two times a day (BID) | ORAL | Status: DC

## 2015-03-04 MED FILL — VANCOMYCIN 50MG/ML ORAL SOL: 50MG/1ML | 14 days supply | Qty: 300 | Fill #0

## 2015-03-04 NOTE — Telephone Encounter (Signed)
LM for the patient advising the insurance company wants [place] to do a prior authorization for the vancomycin.  I told her I would call her back.

## 2015-03-04 NOTE — Progress Notes (Signed)
Patient ID: Maria Logan, female   DOB: [birth year], 50 y.o.   MRN: [id number]   Subjective:    Patient ID: Maria Logan, female    DOB: 1965-06-08, 50 y.o.   MRN: [id number]  HPI  [name] is a pleasant 50 year old African-American female new to GI today referred by Dr. Hansel Starling for evaluation of diarrhea and abdominal pain. Patient has no prior GI history. She had undergone a total abdominal hysterectomy on 12/23/2014. This was complicated by a postop cuff  infection and patient had 2 admissions for IV antibiotics. She had CT scan done on 01/09/2015 which showed an irregular 4.8 x 2.3 x 3.9 cm gas and fluid collection adjacent to the vaginal cuff with thick walls and prominent surrounding fat stranding consistent with a pelvic abscess. She was treated briefly with IV antibiotics and then discharged home on Cipro and Flagyl. She says just prior to that second diminutive admission she was having some purulent-appearing vaginal drainage. She finished that course of antibiotics and then went to the emergency room on 02/25/2015 due to persistent complaints of right-sided lower abdominal pain hot and cold spells nausea and diarrhea. Repeat CT was done and shows a 5.2 x 3.2 cm complex fluid collection with enhancing walls in the right posterior hemipelvis concerning for abscess. She again was placed on Cipro and Flagyl which she is still taking. Patient says over the past month she has had ongoing problems with diarrhea up to 6-7 to times per day with loose mushy stools and significant urgency. She needs to feel poorly in general, with nausea and poor appetite weight loss and ongoing diarrhea as well as right lower quadrant pain. She was seen by a GYN on 03/02/2015 and referred here it was not felt certain that she had persistent abscess as she had a normal WBC. Patient is quite frustrated at this time.  Review of Systems Pertinent positive and negative review of systems were noted in the above HPI section.   All other review of systems was otherwise negative.  Outpatient Encounter Prescriptions as of 03/04/2015  Medication Sig  . ciprofloxacin (CIPRO) 500 MG tablet Take 1 tablet (500 mg total) by mouth 2 (two) times daily.  Marland Kitchen estradiol (ESTRACE) 1 MG tablet Take 1 tablet (1 mg total) by mouth daily.  Marland Kitchen ibuprofen (ADVIL,MOTRIN) 600 MG tablet Take 1 tablet (600 mg total) by mouth every 6 (six) hours as needed for moderate pain or cramping.  . metroNIDAZOLE (FLAGYL) 500 MG tablet Take 1 tablet (500 mg total) by mouth 2 (two) times daily.  . Multiple Vitamin (MULTIVITAMIN WITH MINERALS) TABS tablet Take 1 tablet by mouth daily.  . ondansetron (ZOFRAN) 4 MG tablet Take 1 tablet (4 mg total) by mouth every 8 (eight) hours as needed for nausea or vomiting.  Marland Kitchen VOLTAREN 1 % GEL Apply to left knee up to 4 times daily as needed for pain.  Marland Kitchen dicyclomine (BENTYL) 20 MG tablet Take 1 tablet (20 mg total) by mouth 3 (three) times daily before meals. (Patient not taking: Reported on 03/04/2015)  . docusate sodium (COLACE) 100 MG capsule Take 1 capsule (100 mg total) by mouth 2 (two) times daily. (Patient not taking: Reported on 01/22/2015)  . saccharomyces boulardii (FLORASTOR) 250 MG capsule Take 1 capsule (250 mg total) by mouth 2 (two) times daily.  . vancomycin (VANCOCIN) 250 MG capsule Take 1 capsule (250 mg total) by mouth 4 (four) times daily.  . [DISCONTINUED] ciprofloxacin (CIPRO) 500 MG tablet Take 1 tablet (  500 mg total) by mouth 2 (two) times daily. (Patient not taking: Reported on 02/05/2015)  . [DISCONTINUED] metroNIDAZOLE (FLAGYL) 500 MG tablet Take 1 tablet (500 mg total) by mouth 2 (two) times daily. (Patient not taking: Reported on 03/04/2015)  . [DISCONTINUED] oxyCODONE-acetaminophen (PERCOCET/ROXICET) 5-325 MG tablet Take 1-2 tablets by mouth every 3 (three) hours as needed (moderate to severe pain (when tolerating fluids)). (Patient not taking: Reported on 02/05/2015)  . [DISCONTINUED] zolpidem (AMBIEN)  5 MG tablet Take 1 tablet (5 mg total) by mouth at bedtime as needed for sleep. (Patient not taking: Reported on 02/05/2015)   No facility-administered encounter medications on file as of 03/04/2015.   No Known Allergies Patient Active Problem List   Diagnosis Date Noted  . Night sweats 02/05/2015  . Abscess of female pelvis 01/09/2015  . Vaginal cuff cellulitis 01/01/2015  . Change in bowel function 12/30/2014  . S/P TAH (total abdominal hysterectomy) 12/24/2014   Social History   Social History  . Marital Status: Single    Spouse Name: N/A  . Number of Children: 3  . Years of Education: N/A   Occupational History  . house keeper/personal care assistant    Social History Main Topics  . Smoking status: Never Smoker   . Smokeless tobacco: Never Used  . Alcohol Use: No  . Drug Use: No  . Sexual Activity: Not Currently    Birth Control/ Protection: Surgical   Other Topics Concern  . Not on file   Social History Narrative    Ms. Feldhaus's family history is negative for Alcohol abuse, Arthritis, Asthma, Birth defects, Cancer, COPD, Depression, Diabetes, Drug abuse, Early death, Hearing loss, Heart disease, Hyperlipidemia, Hypertension, Kidney disease, Learning disabilities, Mental illness, Mental retardation, Miscarriages / Stillbirths, Stroke, Vision loss, and Varicose Veins.      Objective:    Filed Vitals:   03/04/15 0949  BP: 100/60  Pulse: 64    Physical Exam  well-developed African American female in no acute distress, pleasant blood pressure 100/60 pulse 64 height 5 foot 4 weight 166. HEENT; nontraumatic, cephalic EOMI PERRLA sclera anicteric, Cardiovascular; regular rate and rhythm with S1-S2 no murmur or gallop, Pulmonary; clear bilaterally, Abdomen; soft, bowel sounds are present she is tender in the right lower quadrant no guarding or rebound low transverse incisional scar, no palpable mass or hepatosplenomegaly, Rectal; exam not done, Ext;no clubbing cyanosis or  edema skin warm and dry, Neuropsych; mood and affect appropriate     Assessment & Plan:   #1  50 yo female 2 months s/p TAH  Which was complicated by pelvic abscess- she has been treated with several courses of antibiotics but CT as of 02/25/2015 appears to show a persistent abscess in right pelvis  Measuring 5.2x3.2 cm #2 diarrhea x one month- suspect Cdiff as culprit though could have antibiotic associated diarrhea #3 nausea, weight loss,fatigue sweats all likely  related to combination of above problems.  Plan; complete current course of Cipro and Flagyl- has 2-3 days left  Cbc, BMET, CRP Stool for Cdiff PCR Start Vancomycin 250 mg po QID x 14 days  Start Florastor one po BID x one month  Believe pt needs IR  Consult and Drainage of  persistent pelvic abscess- have asked to contact her GYN surgeon and will route message .  Pt will be established  with Dr Olene Floss PA-C 03/04/2015   Cc: Woodroe Mode, MD

## 2015-03-04 NOTE — Patient Instructions (Addendum)
Please go to the basement level to have your labs drawn and a stool study.   Complete the course of Cipro and Flagyl you are taking.   We sent prescriptions to [facility].  1. Florastor probiotic 2. Vancomycin 250 mg capsules

## 2015-03-04 NOTE — Telephone Encounter (Signed)
Advised the patient we sent the prescription to [facility].  Vancomycin liquid, 250 mg/ 280 ml's. Take 20 ml's  Daily x 14 days. (They will fill 300 ml's .) The cost for the patient will be under $ 6.00.  She thanked me for calling. I gave her the address of [place] for the pharmacy.

## 2015-03-04 NOTE — Telephone Encounter (Signed)
A user error has taken place.

## 2015-03-04 NOTE — Telephone Encounter (Signed)
Patient called and left message stating she is calling about her appt with [facility]. It was difficult to understand what patient was saying but it sounded like the patient was asking if she needed to keep her stool sample out or in the fridge before her appt at 10am today. Called patient, no answer- left message stating we are trying to reach you to return your phone call, please call us back at the clinics

## 2015-03-05 ENCOUNTER — Telehealth: Payer: Self-pay | Admitting: Physician Assistant

## 2015-03-05 LAB — CLOSTRIDIUM DIFFICILE BY PCR: Toxigenic C. Difficile by PCR: NOT DETECTED

## 2015-03-05 NOTE — Telephone Encounter (Signed)
Patient is unable to see Dr Roselie Awkward until 03/17/15. She is concerned. Per Nicoletta Ba, labs show a negative C-diff, but start Vancomycin anyway. Probiotics daily. Take Immodium. Stay hydrated.

## 2015-03-06 ENCOUNTER — Ambulatory Visit: Payer: Medicaid Other | Admitting: Obstetrics & Gynecology

## 2015-03-09 NOTE — Progress Notes (Signed)
Agree with Ms. Genia Harold assessment and plan. Gatha Mayer, MD, [facility]   I messaged her gynecologist about setting up the IR eval and drainage. Phone # in [place] was [facility].

## 2015-03-10 ENCOUNTER — Other Ambulatory Visit: Payer: Self-pay | Admitting: Obstetrics & Gynecology

## 2015-03-10 ENCOUNTER — Telehealth: Payer: Self-pay | Admitting: *Deleted

## 2015-03-10 DIAGNOSIS — R1900 Intra-abdominal and pelvic swelling, mass and lump, unspecified site: Secondary | ICD-10-CM

## 2015-03-10 NOTE — Telephone Encounter (Signed)
Received request from Dr. Roselie Awkward to schedule U/S for patient.  I scheduled for 03/13/15 at 11 am.  Phoned patient asked her to arrive at 10:45 am for registration with a full bladder.  I explained to patient that Dr. Roselie Awkward would review the results and discuss options/next steps at her appointment with him on Monday 03/16/15 at 12:45 pm.  Patient states understanding.

## 2015-03-11 ENCOUNTER — Telehealth: Payer: Self-pay

## 2015-03-11 NOTE — Telephone Encounter (Signed)
Pt is scheduled for US transvaginal on 03/13/2014. This needs to be pre authorization before then

## 2015-03-12 ENCOUNTER — Telehealth: Payer: Self-pay | Admitting: *Deleted

## 2015-03-12 NOTE — Telephone Encounter (Signed)
Received message from precert center. Patient has pelvic US this Friday, needs precert for Medicaid. precert obtained and number placed on appt notes. Also called and left message for precert center on voicemail.

## 2015-03-13 ENCOUNTER — Other Ambulatory Visit: Payer: Self-pay

## 2015-03-13 ENCOUNTER — Telehealth: Payer: Self-pay

## 2015-03-13 ENCOUNTER — Ambulatory Visit (HOSPITAL_COMMUNITY)
Admission: RE | Admit: 2015-03-13 | Discharge: 2015-03-13 | Disposition: A | Discharge status: Home / Self Care | Payer: Medicaid Other | Source: Ambulatory Visit | Attending: Obstetrics & Gynecology | Admitting: Obstetrics & Gynecology

## 2015-03-13 DIAGNOSIS — N739 Female pelvic inflammatory disease, unspecified: Secondary | ICD-10-CM

## 2015-03-13 DIAGNOSIS — R1900 Intra-abdominal and pelvic swelling, mass and lump, unspecified site: Secondary | ICD-10-CM

## 2015-03-13 DIAGNOSIS — Z90710 Acquired absence of both cervix and uterus: Secondary | ICD-10-CM | POA: Insufficient documentation

## 2015-03-13 NOTE — Telephone Encounter (Signed)
Call to the patient and explained to her this plan of action. She is in agreement and wants to get this scheduled.

## 2015-03-13 NOTE — Telephone Encounter (Signed)
-----   Message from Alfredia Ferguson, PA-C sent at 03/13/2015 12:34 PM EST ----- Regarding: FW: IR eval Beth - please call IR at [facility] and get this pt set up for IR consult and drainage of Right pelvic abscess next week thank you ----- Message -----    From: Gatha Mayer, MD    Sent: 03/13/2015   7:50 AM      To: Alfredia Ferguson, PA-C Subject: IR eval                                        This is the lady  with post-GYN surgery abscess but I got a response from Dr. Roselie Awkward asking [place] to refer her to IR Please help with that  Thanks  Glendell Docker

## 2015-03-13 NOTE — Telephone Encounter (Signed)
IR contacted. They instructed to enter this an a CT guided draining of the abscess, not "IR". Also this has to be reviewed by the radiologist before it can be scheduled. Order placed. The patient is aware she will be contacted directly to schedule the procedure. It was also communicated with scheduling that the patient prefers a Monday due to her work schedule.

## 2015-03-16 ENCOUNTER — Ambulatory Visit (INDEPENDENT_AMBULATORY_CARE_PROVIDER_SITE_OTHER): Payer: Medicaid Other | Admitting: Obstetrics & Gynecology

## 2015-03-16 ENCOUNTER — Encounter: Payer: Self-pay | Admitting: Obstetrics & Gynecology

## 2015-03-16 VITALS — BP 106/69 | HR 66 | Temp 98.4°F | Wt 164.1 lb

## 2015-03-16 DIAGNOSIS — N739 Female pelvic inflammatory disease, unspecified: Secondary | ICD-10-CM

## 2015-03-16 DIAGNOSIS — N732 Unspecified parametritis and pelvic cellulitis: Secondary | ICD-10-CM

## 2015-03-16 NOTE — Progress Notes (Signed)
Patient ID: Maria Logan, female   DOB: 1965/08/07, 50 y.o.   MRN: [id number]  Chief Complaint  Patient presents with  . Follow-up    pain  postop from [facility] 12/23/14  HPI Maria Logan is a 50 y.o. female.  She continues to have RLQ pain and diarrhea and cramping after treatment for pelvic abscess postop [facility]. IR was consulted last week to arrange drainage but Dr. Anselm Pancoast stated her thought the abscess was not accessible.   HPI  Past Medical History  Diagnosis Date  . Fibroids   . Vaginal delivery 1988, 1990, 2005  . Umbilical hernia     CT [id number]    Past Surgical History  Procedure Laterality Date  . Examination under anesthesia N/A 06/04/2014    Procedure: EXAM UNDER ANESTHESIA;  Surgeon: Guss Bunde, MD;  Location: [facility];  Service: Gynecology;  Laterality: N/A;  . Operative ultrasound N/A 06/04/2014    Procedure: OPERATIVE ULTRASOUND;  Surgeon: Guss Bunde, MD;  Location: [facility];  Service: Gynecology;  Laterality: N/A;  . Abdominal hysterectomy N/A 12/23/2014    Procedure: HYSTERECTOMY ABDOMINAL;  Surgeon: Woodroe Mode, MD;  Location: [facility];  Service: Gynecology;  Laterality: N/A;  Requested 12/23/14 @ 1:00p  . Bilateral salpingectomy Bilateral 12/23/2014    Procedure: BILATERAL SALPINGECTOMY;  Surgeon: Woodroe Mode, MD;  Location: [facility];  Service: Gynecology;  Laterality: Bilateral;    Family History  Problem Relation Age of Onset  . Alcohol abuse Neg Hx   . Arthritis Neg Hx   . Asthma Neg Hx   . Birth defects Neg Hx   . Cancer Neg Hx   . COPD Neg Hx   . Depression Neg Hx   . Diabetes Neg Hx   . Drug abuse Neg Hx   . Early death Neg Hx   . Hearing loss Neg Hx   . Heart disease Neg Hx   . Hyperlipidemia Neg Hx   . Hypertension Neg Hx   . Kidney disease Neg Hx   . Learning disabilities Neg Hx   . Mental illness Neg Hx   . Mental retardation Neg Hx   . Miscarriages / Stillbirths Neg Hx   . Stroke Neg Hx   . Vision loss Neg Hx   . Varicose Veins Neg Hx      Social History Social History  Substance Use Topics  . Smoking status: Never Smoker   . Smokeless tobacco: Never Used  . Alcohol Use: No    No Known Allergies  Current Outpatient Prescriptions  Medication Sig Dispense Refill  . dicyclomine (BENTYL) 20 MG tablet Take 1 tablet (20 mg total) by mouth 3 (three) times daily before meals. 60 tablet 0  . estradiol (ESTRACE) 1 MG tablet Take 1 tablet (1 mg total) by mouth daily. 30 tablet 6  . ibuprofen (ADVIL,MOTRIN) 600 MG tablet Take 1 tablet (600 mg total) by mouth every 6 (six) hours as needed for moderate pain or cramping. 30 tablet 0  . Multiple Vitamin (MULTIVITAMIN WITH MINERALS) TABS tablet Take 1 tablet by mouth daily.    . ondansetron (ZOFRAN) 4 MG tablet Take 1 tablet (4 mg total) by mouth every 8 (eight) hours as needed for nausea or vomiting. 11 tablet 0  . vancomycin (VANCOCIN) 250 MG capsule Take 1 capsule (250 mg total) by mouth 4 (four) times daily. 56 capsule 0  . VOLTAREN 1 % GEL Apply to left knee up to 4 times daily as needed  for pain.  1  . docusate sodium (COLACE) 100 MG capsule Take 1 capsule (100 mg total) by mouth 2 (two) times daily. (Patient not taking: Reported on 03/16/2015) 10 capsule 0  . saccharomyces boulardii (FLORASTOR) 250 MG capsule Take 1 capsule (250 mg total) by mouth 2 (two) times daily. (Patient not taking: Reported on 03/16/2015) 60 capsule 0   No current facility-administered medications for this visit.    Review of Systems Review of Systems  Constitutional: Negative for fever.  Gastrointestinal: Positive for abdominal pain and diarrhea. Negative for blood in stool and anal bleeding.  Genitourinary: Positive for pelvic pain.    Blood pressure 106/69, pulse 66, temperature 98.4 F (36.9 C), temperature source Oral, weight 164 lb 1.6 oz (74.435 kg), last menstrual period 12/09/2014.  Physical Exam Physical Exam  Constitutional: She is oriented to person, place, and time. She appears  well-developed. No distress.  Pulmonary/Chest: Effort normal.  Abdominal: Soft. She exhibits no mass. There is tenderness. There is no rebound and no guarding.  Neurological: She is alert and oriented to person, place, and time.  Psychiatric: She has a normal mood and affect. Her behavior is normal.    Data Reviewed CLINICAL DATA: 50 year old female with right lower quadrant an suprapubic abdominal pain. History of pelvic abscess after hysterectomy.  EXAM: CT ABDOMEN AND PELVIS WITH CONTRAST  TECHNIQUE: Multidetector CT imaging of the abdomen and pelvis was performed using the standard protocol following bolus administration of intravenous contrast.  CONTRAST: 152mL OMNIPAQUE IOHEXOL 300 MG/ML SOLN, 59mL OMNIPAQUE IOHEXOL 300 MG/ML SOLN  COMPARISON: CT dated 01/09/2015  FINDINGS: The visualized lung bases are clear. No intra-abdominal free air. Trace free fluid within pelvis. Set  The liver, gallbladder, pancreas, spleen, and the adrenal glands appear unremarkable. There is a 2.8 cm left renal cyst. The kidneys are otherwise unremarkable. The visualized ureters and urinary bladder appear unremarkable.  Hysterectomy. There is mild diffuse haziness of the pelvic floor fat. There is a 5.2 x 3.2 cm walled-off complex fluid in the right posterior hemipelvis in the region of the previously seen ill-defined gas containing collection. This collection demonstrates areas of higher attenuating content inferiorly. There is enhancement of the walls of this collection. Likely represents an abscess. A right ovarian hemorrhagic cyst is not excluded. Correlation with clinical exam and pelvic ultrasound is recommended.  There is moderate stool throughout the colon. No evidence of bowel obstruction or inflammation. Normal appendix.  The abdominal aorta and IVC appear unremarkable. No portal venous gas identified. There is no adenopathy. The there is a small fat containing  umbilical hernia. Anterior pelvic wall surgical scar noted. The osseous structures are intact.  IMPRESSION: A 5.2 x 3.2 cm complex fluid collection with enhancing walls in the right posterior hemipelvis concerning for an abscess and less likely a right ovarian hemorrhagic cyst. Correlation with clinical exam and pelvic ultrasound is recommended.   Electronically Signed  By: Anner Crete M.D.  On: 02/25/2015 19:22   Assessment    Possible pelvic abscess 7-8 weeks postop from TAH, with associated GI sx, not shown to be C. diff     Plan    Consulted with radiology and she will be contacted to arrange CT guided drainage of possible pelvic abscess.        Keeanna Villafranca 03/16/2015, 2:12 PM

## 2015-03-16 NOTE — Patient Instructions (Signed)
Abdominal Hysterectomy Abdominal hysterectomy is a surgery to remove your womb (uterus). Your womb is the part of your body that contains a growing baby. The surgery may be done for many reasons. These may include cancer, growths (tumors), long-term pain, or bleeding. You may also need other reproductive parts removed during this surgery. This will depend on why you need to have the surgery. BEFORE THE PROCEDURE  Talk to your doctor about the changes to your body. These changes may be physical and emotional.  You may need to have blood work done. You may also need X-rays done.  Quit smoking if you smoke. Ask your doctor for help.  Stop taking medicines that thin your blood as told by your doctor.  Your doctor may have you take other medicines. Take all medicines as told by your doctor.  Do not eat or drink anything for 6-8 hours before surgery.  Take your normal medicines with a small sip of water.  Shower or take a bath the night or morning before surgery. PROCEDURE  This surgery is done in the hospital.  You are given a medicine that makes you go to sleep (general anesthetic).  The doctor will make a cut (incision) through the skin in your lower belly.  The cut may be about 5-7 inches long. It may go side-to-side or up-and-down.  The doctor will move the body tissue that covers your womb. The doctor will carefully remove your womb. The doctor may remove any other reproductive parts that need to be removed.  The doctor will use clamps or stitches (sutures) to control bleeding.  The doctor will close your cut with stitches or metal clips. AFTER THE PROCEDURE  You will have pain right after the procedure.  You will be given pain medicine in the recovery room.  You will be taken to your hospital room after the medicines that made you go to sleep wear off.  You will be told how to take care of yourself at home.   This information is not intended to replace advice given to you  by your health care provider. Make sure you discuss any questions you have with your health care provider.   Document Released: 01/01/2013 Document Reviewed: 01/01/2013 Elsevier Interactive Patient Education 2016 [employer] CLINICAL DATA: 50 year old female with right lower quadrant an suprapubic abdominal pain. History of pelvic abscess after hysterectomy.  EXAM: CT ABDOMEN AND PELVIS WITH CONTRAST  TECHNIQUE: Multidetector CT imaging of the abdomen and pelvis was performed using the standard protocol following bolus administration of intravenous contrast.  CONTRAST: 157mL OMNIPAQUE IOHEXOL 300 MG/ML SOLN, 10mL OMNIPAQUE IOHEXOL 300 MG/ML SOLN  COMPARISON: CT dated 01/09/2015  FINDINGS: The visualized lung bases are clear. No intra-abdominal free air. Trace free fluid within pelvis. Set  The liver, gallbladder, pancreas, spleen, and the adrenal glands appear unremarkable. There is a 2.8 cm left renal cyst. The kidneys are otherwise unremarkable. The visualized ureters and urinary bladder appear unremarkable.  Hysterectomy. There is mild diffuse haziness of the pelvic floor fat. There is a 5.2 x 3.2 cm walled-off complex fluid in the right posterior hemipelvis in the region of the previously seen ill-defined gas containing collection. This collection demonstrates areas of higher attenuating content inferiorly. There is enhancement of the walls of this collection. Likely represents an abscess. A right ovarian hemorrhagic cyst is not excluded. Correlation with clinical exam and pelvic ultrasound is recommended.  There is moderate stool throughout the colon. No evidence of bowel obstruction or inflammation. Normal  appendix.  The abdominal aorta and IVC appear unremarkable. No portal venous gas identified. There is no adenopathy. The there is a small fat containing umbilical hernia. Anterior pelvic wall surgical scar noted. The osseous structures are  intact.  IMPRESSION: A 5.2 x 3.2 cm complex fluid collection with enhancing walls in the right posterior hemipelvis concerning for an abscess and less likely a right ovarian hemorrhagic cyst. Correlation with clinical exam and pelvic ultrasound is recommended.   Electronically Signed  By: Anner Crete M.D.  On: 02/25/2015 19:22

## 2015-03-18 ENCOUNTER — Other Ambulatory Visit: Payer: Self-pay | Admitting: Radiology

## 2015-03-18 ENCOUNTER — Other Ambulatory Visit: Payer: Self-pay | Admitting: General Surgery

## 2015-03-18 ENCOUNTER — Telehealth: Payer: Self-pay | Admitting: Obstetrics & Gynecology

## 2015-03-18 NOTE — Telephone Encounter (Signed)
Called pt and advised her that since I do not work in the department that will be performing her procedure, I cannot answer her questions. I gave her the number for [facility] campus and advised her to ask to be transferred to the [facility]. The staff from that dept should be able to answer her questions. Pt voiced understanding.

## 2015-03-19 ENCOUNTER — Ambulatory Visit (HOSPITAL_COMMUNITY)
Admission: RE | Admit: 2015-03-19 | Discharge: 2015-03-19 | Disposition: A | Discharge status: Home / Self Care | Payer: Medicaid Other | Source: Ambulatory Visit | Attending: Physician Assistant | Admitting: Physician Assistant

## 2015-03-19 ENCOUNTER — Other Ambulatory Visit: Payer: Self-pay | Admitting: Physician Assistant

## 2015-03-19 DIAGNOSIS — T814XXA Infection following a procedure, initial encounter: Secondary | ICD-10-CM | POA: Insufficient documentation

## 2015-03-19 DIAGNOSIS — Y838 Other surgical procedures as the cause of abnormal reaction of the patient, or of later complication, without mention of misadventure at the time of the procedure: Secondary | ICD-10-CM | POA: Insufficient documentation

## 2015-03-19 DIAGNOSIS — N739 Female pelvic inflammatory disease, unspecified: Secondary | ICD-10-CM

## 2015-03-19 DIAGNOSIS — N83201 Unspecified ovarian cyst, right side: Secondary | ICD-10-CM | POA: Insufficient documentation

## 2015-03-19 DIAGNOSIS — K651 Peritoneal abscess: Secondary | ICD-10-CM | POA: Diagnosis not present

## 2015-03-19 LAB — CBC
HCT: 38.4 % (ref 36.0–46.0)
Hemoglobin: 12.6 g/dL (ref 12.0–15.0)
MCH: 29.5 pg (ref 26.0–34.0)
MCHC: 32.8 g/dL (ref 30.0–36.0)
MCV: 89.9 fL (ref 78.0–100.0)
PLATELETS: 235 10*3/uL (ref 150–400)
RBC: 4.27 MIL/uL (ref 3.87–5.11)
RDW: 13.3 % (ref 11.5–15.5)
WBC: 5.2 10*3/uL (ref 4.0–10.5)

## 2015-03-19 LAB — PROTIME-INR
INR: 1.07 (ref 0.00–1.49)
PROTHROMBIN TIME: 14.1 s (ref 11.6–15.2)

## 2015-03-19 LAB — APTT: aPTT: 30 seconds (ref 24–37)

## 2015-03-19 MED ORDER — IOHEXOL 300 MG/ML  SOLN
50.0000 mL | Freq: Once | INTRAMUSCULAR | Status: AC | PRN
  Administered 2015-03-19: 50 mL via INTRAVENOUS

## 2015-03-19 MED ORDER — SODIUM CHLORIDE 0.9 % IV SOLN: INTRAVENOUS | Status: DC

## 2015-03-19 MED ORDER — LIDOCAINE HCL 1 % IJ SOLN
INTRAMUSCULAR | Status: AC
  Filled 2015-03-19: qty 20

## 2015-03-19 MED ORDER — MIDAZOLAM HCL 2 MG/2ML IJ SOLN
INTRAMUSCULAR | Status: AC
  Filled 2015-03-19: qty 4

## 2015-03-19 MED ORDER — FENTANYL CITRATE (PF) 100 MCG/2ML IJ SOLN
INTRAMUSCULAR | Status: AC
  Filled 2015-03-19: qty 4

## 2015-03-19 NOTE — Procedures (Signed)
Interventional Radiology Procedure Note  Procedure: Planned CT guided aspiration/drainage of right pelvic fluid, suspicious for abscess.    Findings:  CT shows significant reduction in size of the fluid collection in the right adnexal region, confirmed with 2-phase CT scan.  My impression, given the appearance on today's study and the association with the ovary/ovarian vein on the prior CT imaging, is that this entity is a hemorrhagic ovarian cyst.   The patient has been afebrile, and denies any rigors or chills.  She does report right pelvic pain, which could be from an ovarian entity.    I have discussed the findings and my impression with the patient and her family, as well as referring doctor, Dr. Trellis Paganini.    Recommendations:  - No aspiration/drainage, given the size reduction and appearance of hemorrhage ovarian cyst.  - D/C home, with follow up with OB/GYN   Call with questions/concerns.   Signed,  Dulcy Fanny. Earleen Newport, DO

## 2015-03-19 NOTE — H&P (Signed)
Chief Complaint: Patient was seen in consultation today for drainage of pelvic fluid coolection at the request of Alfredia Ferguson  Referring Physician(s): Alfredia Ferguson  Supervising Physician: Corrie Mckusick  History of Present Illness: Maria Logan is a 50 y.o. female who underwent hysterectomy a few months ago. She developed a post op pelvic fluid collection, but was deemed undrainable. She has continued to have some sxs such as diarrhea, nausea, and low abd pains. No fevers. She had a pelvic US which shows persistent pelvic fluid collection and has now been referred for aspiration/drainage. PMHx, meds, labs, imaging reviewed. Has been NPO this morning.  Past Medical History  Diagnosis Date  . Fibroids   . Vaginal delivery 1988, 1990, 2005  . Umbilical hernia     CT 99991111    Past Surgical History  Procedure Laterality Date  . Examination under anesthesia N/A 06/04/2014    Procedure: EXAM UNDER ANESTHESIA;  Surgeon: Guss Bunde, MD;  Location: [facility];  Service: Gynecology;  Laterality: N/A;  . Operative ultrasound N/A 06/04/2014    Procedure: OPERATIVE ULTRASOUND;  Surgeon: Guss Bunde, MD;  Location: [facility];  Service: Gynecology;  Laterality: N/A;  . Abdominal hysterectomy N/A 12/23/2014    Procedure: HYSTERECTOMY ABDOMINAL;  Surgeon: Woodroe Mode, MD;  Location: [facility];  Service: Gynecology;  Laterality: N/A;  Requested 12/23/14 @ 1:00p  . Bilateral salpingectomy Bilateral 12/23/2014    Procedure: BILATERAL SALPINGECTOMY;  Surgeon: Woodroe Mode, MD;  Location: [facility];  Service: Gynecology;  Laterality: Bilateral;    Allergies: Review of patient's allergies indicates no known allergies.  Medications: Prior to Admission medications   Medication Sig Start Date End Date Taking? Authorizing Provider  estradiol (ESTRACE) 1 MG tablet Take 1 tablet (1 mg total) by mouth daily. 02/05/15  Yes Woodroe Mode, MD  ibuprofen (ADVIL,MOTRIN) 600 MG tablet Take 1  tablet (600 mg total) by mouth every 6 (six) hours as needed for moderate pain or cramping. 12/30/14  Yes Lori A Clemmons, CNM  Multiple Vitamin (MULTIVITAMIN WITH MINERALS) TABS tablet Take 1 tablet by mouth daily.   Yes Historical Provider, MD  ondansetron (ZOFRAN) 4 MG tablet Take 1 tablet (4 mg total) by mouth every 8 (eight) hours as needed for nausea or vomiting. 02/25/15  Yes Courteney Lyn Mackuen, MD  saccharomyces boulardii (FLORASTOR) 250 MG capsule Take 1 capsule (250 mg total) by mouth 2 (two) times daily. 03/04/15  Yes Amy S Esterwood, PA-C  vancomycin (VANCOCIN) 250 MG capsule Take 1 capsule (250 mg total) by mouth 4 (four) times daily. 03/04/15  Yes Amy S Esterwood, PA-C  VOLTAREN 1 % GEL Apply to left knee up to 4 times daily as needed for pain. 12/05/14  Yes Historical Provider, MD     Family History  Problem Relation Age of Onset  . Alcohol abuse Neg Hx   . Arthritis Neg Hx   . Asthma Neg Hx   . Birth defects Neg Hx   . Cancer Neg Hx   . COPD Neg Hx   . Depression Neg Hx   . Diabetes Neg Hx   . Drug abuse Neg Hx   . Early death Neg Hx   . Hearing loss Neg Hx   . Heart disease Neg Hx   . Hyperlipidemia Neg Hx   . Hypertension Neg Hx   . Kidney disease Neg Hx   . Learning disabilities Neg Hx   . Mental illness Neg Hx   .  Mental retardation Neg Hx   . Miscarriages / Stillbirths Neg Hx   . Stroke Neg Hx   . Vision loss Neg Hx   . Varicose Veins Neg Hx     Social History   Social History  . Marital Status: Single    Spouse Name: N/A  . Number of Children: 3  . Years of Education: N/A   Occupational History  . house keeper/personal care assistant    Social History Main Topics  . Smoking status: Never Smoker   . Smokeless tobacco: Never Used  . Alcohol Use: No  . Drug Use: No  . Sexual Activity: Not Currently    Birth Control/ Protection: Surgical   Other Topics Concern  . Not on file   Social History Narrative    Review of Systems: A 12 point ROS  discussed and pertinent positives are indicated in the HPI above.  All other systems are negative.  Review of Systems  Vital Signs: BP 112/76 mmHg  Temp(Src) 98.1 F (36.7 C) (Oral)  Resp 12  Ht 5\' 6"  (1.676 m)  Wt 168 lb (76.204 kg)  BMI 27.13 kg/m2  SpO2 100%  LMP 12/09/2014 (Approximate)  Physical Exam  Constitutional: She is oriented to person, place, and time. She appears well-developed and well-nourished. No distress.  HENT:  Head: Normocephalic.  Mouth/Throat: Oropharynx is clear and moist.  Neck: Normal range of motion. No tracheal deviation present.  Cardiovascular: Normal rate, regular rhythm and normal heart sounds.   Pulmonary/Chest: Effort normal and breath sounds normal. No respiratory distress.  Abdominal: Soft. There is no tenderness.  Neurological: She is alert and oriented to person, place, and time.  Psychiatric: She has a normal mood and affect. Judgment normal.    Mallampati Score:  MD Evaluation Airway: WNL Heart: WNL Abdomen: WNL Chest/ Lungs: WNL ASA  Classification: 2 Mallampati/Airway Score: One  Imaging: US Transvaginal Non-ob  03/13/2015  CLINICAL DATA:  Right adnexal mass.  Hysterectomy. EXAM: TRANSABDOMINAL AND TRANSVAGINAL ULTRASOUND OF PELVIS TECHNIQUE: Both transabdominal and transvaginal ultrasound examinations of the pelvis were performed. Transabdominal technique was performed for global imaging of the pelvis including uterus, ovaries, adnexal regions, and pelvic cul-de-sac. It was necessary to proceed with endovaginal exam following the transabdominal exam to visualize the adnexal. COMPARISON:  CT 02/25/2015. FINDINGS: Uterus Hysterectomy. Right ovary Right ovary not visualized. A 5.7 x 2.3 x 3.6 cm complex mass is present in the right adnexa corresponding CT abnormality. Again this could represent a abscess. Hemorrhage or tumor cannot be excluded. Left ovary Not visualized. Other findings No abnormal free fluid. IMPRESSION: 1. 5.7 x 2.3 x  3.6 cm complex mass right adnexa corresponds CT abnormality. Again abscess could present in this fashion . No free fluid noted . 2.  Prior hysterectomy. Electronically Signed   By: [place]   On: 03/13/2015 11:35   US Pelvis Complete  03/13/2015  CLINICAL DATA:  Right adnexal mass.  Hysterectomy. EXAM: TRANSABDOMINAL AND TRANSVAGINAL ULTRASOUND OF PELVIS TECHNIQUE: Both transabdominal and transvaginal ultrasound examinations of the pelvis were performed. Transabdominal technique was performed for global imaging of the pelvis including uterus, ovaries, adnexal regions, and pelvic cul-de-sac. It was necessary to proceed with endovaginal exam following the transabdominal exam to visualize the adnexal. COMPARISON:  CT 02/25/2015. FINDINGS: Uterus Hysterectomy. Right ovary Right ovary not visualized. A 5.7 x 2.3 x 3.6 cm complex mass is present in the right adnexa corresponding CT abnormality. Again this could represent a abscess. Hemorrhage or tumor cannot be  excluded. Left ovary Not visualized. Other findings No abnormal free fluid. IMPRESSION: 1. 5.7 x 2.3 x 3.6 cm complex mass right adnexa corresponds CT abnormality. Again abscess could present in this fashion . No free fluid noted . 2.  Prior hysterectomy. Electronically Signed   By: Marcello Moores  Register   On: 03/13/2015 11:35   Ct Abdomen Pelvis W Contrast  02/25/2015  CLINICAL DATA:  50 year old female with right lower quadrant an suprapubic abdominal pain. History of pelvic abscess after hysterectomy. EXAM: CT ABDOMEN AND PELVIS WITH CONTRAST TECHNIQUE: Multidetector CT imaging of the abdomen and pelvis was performed using the standard protocol following bolus administration of intravenous contrast. CONTRAST:  134mL OMNIPAQUE IOHEXOL 300 MG/ML SOLN, 53mL OMNIPAQUE IOHEXOL 300 MG/ML SOLN COMPARISON:  CT dated 01/09/2015 FINDINGS: The visualized lung bases are clear. No intra-abdominal free air. Trace free fluid within pelvis. Set The liver, gallbladder,  pancreas, spleen, and the adrenal glands appear unremarkable. There is a 2.8 cm left renal cyst. The kidneys are otherwise unremarkable. The visualized ureters and urinary bladder appear unremarkable. Hysterectomy. There is mild diffuse haziness of the pelvic floor fat. There is a 5.2 x 3.2 cm walled-off complex fluid in the right posterior hemipelvis in the region of the previously seen ill-defined gas containing collection. This collection demonstrates areas of higher attenuating content inferiorly. There is enhancement of the walls of this collection. Likely represents an abscess. A right ovarian hemorrhagic cyst is not excluded. Correlation with clinical exam and pelvic ultrasound is recommended. There is moderate stool throughout the colon. No evidence of bowel obstruction or inflammation. Normal appendix. The abdominal aorta and IVC appear unremarkable. No portal venous gas identified. There is no adenopathy. The there is a small fat containing umbilical hernia. Anterior pelvic wall surgical scar noted. The osseous structures are intact. IMPRESSION: A 5.2 x 3.2 cm complex fluid collection with enhancing walls in the right posterior hemipelvis concerning for an abscess and less likely a right ovarian hemorrhagic cyst. Correlation with clinical exam and pelvic ultrasound is recommended. Electronically Signed   By: Anner Crete M.D.   On: 02/25/2015 19:22    Labs:  CBC:  Recent Labs  01/09/15 1752 02/25/15 1223 03/04/15 1112 03/19/15 0615  WBC 9.3 5.3 8.2 5.2  HGB 10.0* 11.7* 12.3 12.6  HCT 30.9* 36.7 37.2 38.4  PLT 511* 285 311.0 235    COAGS:  Recent Labs  05/28/14 0800 03/19/15 0615  INR 1.05 1.07  APTT  --  30    BMP:  Recent Labs  01/01/15 1505 01/02/15 0544 01/09/15 1752 02/25/15 1223 03/04/15 1112  NA 136 137 136 137 137  K 2.8* 4.2 3.8 3.7 4.1  CL 97* 101 103 103 104  CO2 27 27 24 27 28   GLUCOSE 106* 100* 94 92 81  BUN 7 8 14 10 10   CALCIUM 8.2* 8.4* 8.7*  9.2 9.2  CREATININE 0.74 0.89 0.93 0.98 0.91  GFRNONAA >60 >60 >60 >60  --   GFRAA >60 >60 >60 >60  --     LIVER FUNCTION TESTS:  Recent Labs  01/01/15 1505 01/02/15 0544 01/09/15 1752 02/25/15 1223  BILITOT 1.6* 1.2 0.5 0.4  AST 66* 34 18 55*  ALT 65* 50 35 26  ALKPHOS 289* 226* 157* 73  PROT 6.2* 6.5 7.2 7.6  ALBUMIN 2.2* 2.2* 2.7* 3.7    Assessment and Plan: Pelvic fluid collection Plan for CT guided aspiration vs drainage Labs ok Risks and Benefits discussed with the patient including bleeding,  infection, damage to adjacent structures, bowel perforation/fistula connection, and sepsis. All of the patient's questions were answered, patient is agreeable to proceed. Consent signed and in chart.    Thank you for this interesting consult.  A copy of this report was sent to the requesting provider on this date.  Electronically Signed: Ascencion Dike 03/19/2015, 8:07 AM   I spent a total of 20 minutes in face to face in clinical consultation, greater than 50% of which was counseling/coordinating care for drainage of pelvic fluid collection

## 2015-03-19 NOTE — Progress Notes (Signed)
Pt D/C home walking with boyfriend. Awake and alert. In no distress.

## 2015-03-19 NOTE — Sedation Documentation (Signed)
Pt re scanned with contrast and procedure cancelled per Dr. Earleen Newport. Dr. Earleen Newport spoke with pt and pt in agreement with plan.

## 2015-03-20 ENCOUNTER — Telehealth: Payer: Self-pay | Admitting: Obstetrics & Gynecology

## 2015-03-20 NOTE — Telephone Encounter (Signed)
Patient want to speak to a nurse or Dr.Arnold about getting a refill on her Medication

## 2015-03-20 NOTE — Telephone Encounter (Signed)
Spoke with patient she has been advised to take tylenol for pain and continued her antibiotic.

## 2015-08-06 ENCOUNTER — Emergency Department (HOSPITAL_COMMUNITY): Payer: Medicaid Other

## 2015-08-06 ENCOUNTER — Emergency Department (HOSPITAL_COMMUNITY)
Admission: EM | Admit: 2015-08-06 | Discharge: 2015-08-06 | Disposition: A | Discharge status: Home / Self Care | Payer: Medicaid Other | Attending: Emergency Medicine | Admitting: Emergency Medicine

## 2015-08-06 ENCOUNTER — Emergency Department (HOSPITAL_BASED_OUTPATIENT_CLINIC_OR_DEPARTMENT_OTHER)
Admit: 2015-08-06 | Discharge: 2015-08-06 | Disposition: A | Discharge status: Home / Self Care | Payer: Medicaid Other | Attending: Emergency Medicine | Admitting: Emergency Medicine

## 2015-08-06 ENCOUNTER — Encounter (HOSPITAL_COMMUNITY): Payer: Self-pay | Admitting: Emergency Medicine

## 2015-08-06 DIAGNOSIS — M79609 Pain in unspecified limb: Secondary | ICD-10-CM | POA: Diagnosis not present

## 2015-08-06 DIAGNOSIS — M25561 Pain in right knee: Secondary | ICD-10-CM | POA: Diagnosis present

## 2015-08-06 MED ORDER — CYCLOBENZAPRINE HCL 10 MG PO TABS: 10.0000 mg | ORAL_TABLET | Freq: Two times a day (BID) | ORAL | 0 refills | Status: DC | PRN

## 2015-08-06 MED ORDER — NAPROXEN 500 MG PO TABS: 500.0000 mg | ORAL_TABLET | Freq: Two times a day (BID) | ORAL | 0 refills | Status: DC

## 2015-08-06 NOTE — Progress Notes (Signed)
VASCULAR LAB PRELIMINARY  PRELIMINARY  PRELIMINARY  PRELIMINARY  Right lower extremity venous duplex has been completed.     Right:  No evidence of DVT, superficial thrombosis, or Baker's cyst.  Called ED with result spoke with karen.  Oneita Allmon, RVT, RDMS 08/06/2015, 11:16 AM

## 2015-08-06 NOTE — ED Triage Notes (Signed)
Pt reports R leg tightness for the past few weeks that has gradually gotten worse. Pain worse in R knee accompanied by anterior knee swelling. No known injury or long periods of immobility.

## 2015-08-06 NOTE — ED Provider Notes (Signed)
[facility] DEPT Provider Note   CSN: [id number] Arrival date & time: 08/06/15  [id number]  First Provider Contact:  None       History   Chief Complaint Chief Complaint  Patient presents with  . Knee Pain    HPI DONIQUE HOGENSON is a 50 y.o. female.  HPI   50 year old female presenting today with complaint of right knee pain. Patient report having recurrent right knee pain for the past 1 month. She described pain as more of a twisting instability sensation worsening with walking up and down the steps. Sometimes she feels as though the knee is going to give out. She also report having a tingling sensation to the anterior aspects of the knee occasionally radiates down to her foot. The symptom has been persistent for the past several days prompting her to come here for further evaluation. She has tried resting, ice, compression, and elevation as well as taking NSAIDs with minimal improvement. She denies any specific injury to her knee. Yesterday she developed pain that radiates towards her calf and she is concern for potential blood clot. She denies any prior history of PE or DVT, no recent surgery, prolonged bed rest, unilateral leg swelling, active cancer, hemoptysis, or taking oral hormone. No plans of chest pain or shortness of breath. No complaint of back pain, no recent surgery or recent injury. She denies any active pain. Patient reportedly having to use her left leg to compensate for her right knee discomfort. She denies any associated hip or ankle pain. 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  Past Medical History:  Diagnosis Date  . Fibroids   . Umbilical hernia    CT 99991111  . Vaginal delivery 1988, 1990, 2005    Patient Active Problem List   Diagnosis Date Noted  . Night sweats 02/05/2015  . Abscess of female pelvis 01/09/2015  . Vaginal cuff cellulitis 01/01/2015  . Change in bowel function 12/30/2014  . S/P TAH (total abdominal hysterectomy) 12/24/2014     Past Surgical History:  Procedure Laterality Date  . ABDOMINAL HYSTERECTOMY N/A 12/23/2014   Procedure: HYSTERECTOMY ABDOMINAL;  Surgeon: Woodroe Mode, MD;  Location: [facility];  Service: Gynecology;  Laterality: N/A;  Requested 12/23/14 @ 1:00p  . BILATERAL SALPINGECTOMY Bilateral 12/23/2014   Procedure: BILATERAL SALPINGECTOMY;  Surgeon: Woodroe Mode, MD;  Location: [facility];  Service: Gynecology;  Laterality: Bilateral;  . EXAMINATION UNDER ANESTHESIA N/A 06/04/2014   Procedure: EXAM UNDER ANESTHESIA;  Surgeon: Guss Bunde, MD;  Location: [facility];  Service: Gynecology;  Laterality: N/A;  . OPERATIVE ULTRASOUND N/A 06/04/2014   Procedure: OPERATIVE ULTRASOUND;  Surgeon: Guss Bunde, MD;  Location: [facility];  Service: Gynecology;  Laterality: N/A;    OB History    Gravida Para Term Preterm AB Living   3 3 3    0 3   SAB TAB Ectopic Multiple Live Births   0               Home Medications    Prior to Admission medications   Medication Sig Start Date End Date Taking? Authorizing Provider  estradiol (ESTRACE) 1 MG tablet Take 1 tablet (1 mg total) by mouth daily. 02/05/15   Woodroe Mode, MD  ibuprofen (ADVIL,MOTRIN) 600 MG tablet Take 1 tablet (600 mg total) by mouth every 6 (six) hours as needed for moderate pain or cramping. 12/30/14   Bertram Gala Clemmons, CNM  Multiple Vitamin (MULTIVITAMIN WITH MINERALS) TABS tablet  Take 1 tablet by mouth daily.    Historical Provider, MD  ondansetron (ZOFRAN) 4 MG tablet Take 1 tablet (4 mg total) by mouth every 8 (eight) hours as needed for nausea or vomiting. 02/25/15   Courteney Lyn Mackuen, MD  saccharomyces boulardii (FLORASTOR) 250 MG capsule Take 1 capsule (250 mg total) by mouth 2 (two) times daily. 03/04/15   Amy S Esterwood, PA-C  vancomycin (VANCOCIN) 250 MG capsule Take 1 capsule (250 mg total) by mouth 4 (four) times daily. 03/04/15   Amy S Esterwood, PA-C  VOLTAREN 1 % GEL Apply to left knee up to 4 times daily as needed for pain.  12/05/14   Historical Provider, MD    Family History Family History  Problem Relation Age of Onset  . Alcohol abuse Neg Hx   . Arthritis Neg Hx   . Asthma Neg Hx   . Birth defects Neg Hx   . Cancer Neg Hx   . COPD Neg Hx   . Depression Neg Hx   . Diabetes Neg Hx   . Drug abuse Neg Hx   . Early death Neg Hx   . Hearing loss Neg Hx   . Heart disease Neg Hx   . Hyperlipidemia Neg Hx   . Hypertension Neg Hx   . Kidney disease Neg Hx   . Learning disabilities Neg Hx   . Mental illness Neg Hx   . Mental retardation Neg Hx   . Miscarriages / Stillbirths Neg Hx   . Stroke Neg Hx   . Vision loss Neg Hx   . Varicose Veins Neg Hx     Social History Social History  Substance Use Topics  . Smoking status: Never Smoker  . Smokeless tobacco: Never Used  . Alcohol use No     Allergies   Review of patient's allergies indicates no known allergies.   Review of Systems Review of Systems  Constitutional: Negative for fever.  Genitourinary: Negative for flank pain.  Musculoskeletal: Negative for back pain.  Skin: Negative for rash and wound.  Neurological: Positive for numbness. Negative for weakness.     Physical Exam Updated Vital Signs BP 113/80   Pulse 68   Temp 98.4 F (36.9 C) (Oral)   Resp 16   LMP 12/09/2014 (Approximate)   SpO2 100%   Physical Exam  Constitutional: She appears well-developed and well-nourished. No distress.  HENT:  Head: Atraumatic.  Eyes: Conjunctivae are normal.  Neck: Neck supple.  Cardiovascular: Normal rate and regular rhythm.   Pulmonary/Chest: Effort normal and breath sounds normal.  Musculoskeletal: She exhibits tenderness (Right knee: Tenderness noted to lateral joint line on palpation. Overlying kidney bean size lipoma noted. Negative anterior and posterior drawer test, normal varus and valgus maneuver, patella is located).  Right hip and right ankle with full range of motion and nontender. Mild tenderness to right calf region. No  palpable cords erythema or edema noted.  Neurological: She is alert.  Skin: No rash noted.  Psychiatric: She has a normal mood and affect.  Nursing note and vitals reviewed.    ED Treatments / Results  Labs (all labs ordered are listed, but only abnormal results are displayed) Labs Reviewed - No data to display  EKG  EKG Interpretation None       Radiology Dg Knee Complete 4 Views Right  Result Date: 08/06/2015 CLINICAL DATA:  Right knee pain worsening over the last 2 weeks. No known injury. EXAM: RIGHT KNEE - COMPLETE 4+ VIEW COMPARISON:  None. FINDINGS: Small right pleural effusion. No acute bony abnormality. Specifically, no fracture, subluxation, or dislocation. Soft tissues are intact. Joint spaces maintained. IMPRESSION: No acute bony abnormality.  Small right effusion. Electronically Signed   By: Rolm Baptise M.D.   On: 08/06/2015 09:56   Procedures Procedures (including critical care time)  Progress Notes Date of Service: 08/06/2015 11:16 AM Tami Clydene Laming, RVT  Vascular Lab    [] Hide copied text [] Hover for attribution information VASCULAR LAB PRELIMINARY  PRELIMINARY  PRELIMINARY  PRELIMINARY  Right lower extremity venous duplex has been completed.     Right:  No evidence of DVT, superficial thrombosis, or Baker's cyst.  Called ED with result spoke with karen.  Tami Wood, RVT, RDMS 08/06/2015, 11:16 AM        Medications Ordered in ED Medications - No data to display   Initial Impression / Assessment and Plan / ED Course  I have reviewed the triage vital signs and the nursing notes.  Pertinent labs & imaging results that were available during my care of the patient were reviewed by me and considered in my medical decision making (see chart for details).  Clinical Course    BP 113/80   Pulse 68   Temp 98.4 F (36.9 C) (Oral)   Resp 16   LMP 12/09/2014 (Approximate)   SpO2 100%    Final Clinical Impressions(s) / ED Diagnoses   Final  diagnoses:  Knee pain, right    New Prescriptions New Prescriptions   CYCLOBENZAPRINE (FLEXERIL) 10 MG TABLET    Take 1 tablet (10 mg total) by mouth 2 (two) times daily as needed for muscle spasms.   NAPROXEN (NAPROSYN) 500 MG TABLET    Take 1 tablet (500 mg total) by mouth 2 (two) times daily.   10:39 AM Patient presents with right knee pain ongoing for the past month. Tenderness is noted to the lateral joint line on palpation but no obvious joint laxity noted. She does complaining of knee instability. Suspect potential internal derangement. X-ray of her right knee shows a small amount of joint effusion. No obvious signs of trauma on exam. She also complaining of right calf pain and for his concern for potential blood clot. She never had any history of PE or DVT. She report no risk factor however on her medication list she does appears to take oral hormone dated back in January. Therefore, Doppler study obtained to rule out DVT. Knee sleeve provided for support.  11:35 AM DVT study of RLE is negative.  Xray of R knee showing mild effusion.  Knee sleeve, RICE and ortho referral given.  Return precaution discussed.  Pt able to ambulate.    Domenic Moras, PA-C 08/06/15 [place], MD 08/07/15 [phone]

## 2015-08-06 NOTE — Discharge Instructions (Signed)
You may have an internal injury of your right knee such as a meniscal injury or ligament injury.  Please wear knee sleeve, continue with rest,ice,compress, elevate and follow up with orthopedist for further care.  No evidence of blood clot in your leg.

## 2015-10-07 ENCOUNTER — Encounter: Payer: Self-pay | Admitting: *Deleted

## 2015-10-27 ENCOUNTER — Encounter (HOSPITAL_COMMUNITY): Payer: Self-pay | Admitting: Family Medicine

## 2015-10-27 DIAGNOSIS — L0211 Cutaneous abscess of neck: Secondary | ICD-10-CM | POA: Insufficient documentation

## 2015-10-27 NOTE — ED Triage Notes (Signed)
Patient reports 4 days ago she started experiencing an abscess to her right side of neck. Area is slightly red, inflamed. Denies fever and drainage from the site.

## 2015-10-28 ENCOUNTER — Emergency Department (HOSPITAL_COMMUNITY)
Admission: EM | Admit: 2015-10-28 | Discharge: 2015-10-28 | Disposition: A | Discharge status: Home / Self Care | Payer: Medicaid Other | Attending: Emergency Medicine | Admitting: Emergency Medicine

## 2015-10-28 DIAGNOSIS — L0291 Cutaneous abscess, unspecified: Secondary | ICD-10-CM

## 2015-10-28 MED ORDER — SULFAMETHOXAZOLE-TRIMETHOPRIM 800-160 MG PO TABS: 1.0000 | ORAL_TABLET | Freq: Two times a day (BID) | ORAL | 0 refills | Status: AC

## 2015-10-28 MED ORDER — LIDOCAINE HCL 1 % IJ SOLN
INTRAMUSCULAR | Status: AC
  Administered 2015-10-28: 20 mL
  Filled 2015-10-28: qty 20

## 2015-10-28 NOTE — ED Notes (Signed)
Patient verbalizes understanding of discharge instructions, prescriptions, home care and follow up care. Patient out of department at this time. 

## 2015-10-28 NOTE — ED Provider Notes (Signed)
Maria Logan Provider Note   CSN: [id number] Arrival date & time: 10/27/15  2221  By signing my name below, I, Maria Logan, attest that this documentation has been prepared under the direction and in the presence of Maria Greek, MD . Electronically Signed: Higinio Logan, Scribe. 10/28/2015. 1:18 AM.  History   Chief Complaint Chief Complaint  Patient presents with  . Abscess   The history is provided by the patient. No language interpreter was used.   HPI Comments: Maria Logan is a 50 y.o. female who presents to the Emergency Department complaining of gradually worsening, pain and swelling to the right side of her neck that began 4 days ago. Pt reports she has applied alcohol and peroxide to the area with no relief. She denies fever and drainage from the site.   Past Medical History:  Diagnosis Date  . Fibroids   . Umbilical hernia    CT 99991111  . Vaginal delivery 1988, 1990, 2005   Patient Active Problem List   Diagnosis Date Noted  . Night sweats 02/05/2015  . Abscess of female pelvis 01/09/2015  . Vaginal cuff cellulitis 01/01/2015  . Change in bowel function 12/30/2014  . S/P TAH (total abdominal hysterectomy) 12/24/2014    Past Surgical History:  Procedure Laterality Date  . ABDOMINAL HYSTERECTOMY N/A 12/23/2014   Procedure: HYSTERECTOMY ABDOMINAL;  Surgeon: Woodroe Mode, MD;  Location: [facility];  Service: Gynecology;  Laterality: N/A;  Requested 12/23/14 @ 1:00p  . BILATERAL SALPINGECTOMY Bilateral 12/23/2014   Procedure: BILATERAL SALPINGECTOMY;  Surgeon: Woodroe Mode, MD;  Location: [facility];  Service: Gynecology;  Laterality: Bilateral;  . EXAMINATION UNDER ANESTHESIA N/A 06/04/2014   Procedure: EXAM UNDER ANESTHESIA;  Surgeon: Guss Bunde, MD;  Location: [facility];  Service: Gynecology;  Laterality: N/A;  . OPERATIVE ULTRASOUND N/A 06/04/2014   Procedure: OPERATIVE ULTRASOUND;  Surgeon: Guss Bunde, MD;  Location: [facility];  Service:  Gynecology;  Laterality: N/A;    OB History    Gravida Para Term Preterm AB Living   3 3 3    0 3   SAB TAB Ectopic Multiple Live Births   0             Home Medications    Prior to Admission medications   Medication Sig Start Date End Date Taking? Authorizing Provider  cyclobenzaprine (FLEXERIL) 10 MG tablet Take 1 tablet (10 mg total) by mouth 2 (two) times daily as needed for muscle spasms. 08/06/15   Domenic Moras, PA-C  estradiol (ESTRACE) 1 MG tablet Take 1 tablet (1 mg total) by mouth daily. 02/05/15   Woodroe Mode, MD  ibuprofen (ADVIL,MOTRIN) 600 MG tablet Take 1 tablet (600 mg total) by mouth every 6 (six) hours as needed for moderate pain or cramping. 12/30/14   Lori A Clemmons, CNM  Multiple Vitamin (MULTIVITAMIN WITH MINERALS) TABS tablet Take 1 tablet by mouth daily.    Historical Provider, MD  naproxen (NAPROSYN) 500 MG tablet Take 1 tablet (500 mg total) by mouth 2 (two) times daily. 08/06/15   Domenic Moras, PA-C  ondansetron (ZOFRAN) 4 MG tablet Take 1 tablet (4 mg total) by mouth every 8 (eight) hours as needed for nausea or vomiting. 02/25/15   Courteney Lyn Mackuen, MD  saccharomyces boulardii (FLORASTOR) 250 MG capsule Take 1 capsule (250 mg total) by mouth 2 (two) times daily. 03/04/15   Amy S Esterwood, PA-C  sulfamethoxazole-trimethoprim (BACTRIM DS,SEPTRA DS) 800-160 MG tablet Take 1 tablet by  mouth 2 (two) times daily. 10/28/15 11/04/15  Maria Greek, MD  vancomycin (VANCOCIN) 250 MG capsule Take 1 capsule (250 mg total) by mouth 4 (four) times daily. 03/04/15   Amy S Esterwood, PA-C  VOLTAREN 1 % GEL Apply to left knee up to 4 times daily as needed for pain. 12/05/14   Historical Provider, MD    Family History Family History  Problem Relation Age of Onset  . Alcohol abuse Neg Hx   . Arthritis Neg Hx   . Asthma Neg Hx   . Birth defects Neg Hx   . Cancer Neg Hx   . COPD Neg Hx   . Depression Neg Hx   . Diabetes Neg Hx   . Drug abuse Neg Hx   . Early death  Neg Hx   . Hearing loss Neg Hx   . Heart disease Neg Hx   . Hyperlipidemia Neg Hx   . Hypertension Neg Hx   . Kidney disease Neg Hx   . Learning disabilities Neg Hx   . Mental illness Neg Hx   . Mental retardation Neg Hx   . Miscarriages / Stillbirths Neg Hx   . Stroke Neg Hx   . Vision loss Neg Hx   . Varicose Veins Neg Hx     Social History Social History  Substance Use Topics  . Smoking status: Never Smoker  . Smokeless tobacco: Never Used  . Alcohol use No     Allergies   Review of patient's allergies indicates no known allergies.   Review of Systems Review of Systems  Constitutional: Negative for fever.  Skin:       Abscess to right side of neck with no purulent drainage    Physical Exam Updated Vital Signs BP 115/65 (BP Location: Left Arm)   Pulse 69   Temp 98.5 F (36.9 C) (Oral)   Resp 15   Ht 5\' 6"  (1.676 m)   Wt 187 lb 8 oz (85 kg)   LMP 12/09/2014 (Approximate)   SpO2 100%   BMI 30.26 kg/m   Physical Exam  Constitutional: She is oriented to person, place, and time. She appears well-developed and well-nourished. No distress.  HENT:  Head: Normocephalic and atraumatic.  Right Ear: Hearing normal.  Left Ear: Hearing normal.  Nose: Nose normal.  Mouth/Throat: Oropharynx is clear and moist and mucous membranes are normal.  Eyes: Conjunctivae and EOM are normal. Pupils are equal, round, and reactive to light.  Neck: Normal range of motion. Neck supple.  Cardiovascular: Regular rhythm, S1 normal and S2 normal.  Exam reveals no gallop and no friction rub.   No murmur heard. Pulmonary/Chest: Effort normal and breath sounds normal. No respiratory distress. She exhibits no tenderness.  Abdominal: Soft. Normal appearance and bowel sounds are normal. There is no hepatosplenomegaly. There is no tenderness. There is no rebound, no guarding, no tenderness at McBurney's point and negative Murphy's sign. No hernia.  Musculoskeletal: Normal range of motion.    Neurological: She is alert and oriented to person, place, and time. She has normal strength. No cranial nerve deficit or sensory deficit. Coordination normal. GCS eye subscore is 4. GCS verbal subscore is 5. GCS motor subscore is 6.  Skin: Skin is intact. No cyanosis.  3 cm tender, indurated, raised area with central pointing   Psychiatric: She has a normal mood and affect. Her speech is normal and behavior is normal. Thought content normal.  Nursing note and vitals reviewed.  ED Treatments /  Results  Labs (all labs ordered are listed, but only abnormal results are displayed) Labs Reviewed - No data to display  EKG  EKG Interpretation None       Radiology No results found.  Procedures .Marland KitchenIncision and Drainage Date/Time: 10/28/2015 1:48 AM Performed by: Maria Logan Authorized by: Maria Logan   Consent:    Consent obtained:  Verbal   Consent given by:  Patient   Risks discussed:  Bleeding and incomplete drainage   Alternatives discussed:  Alternative treatment Location:    Type:  Abscess   Size:  2cm   Location:  Neck   Neck location:  R posterior Pre-procedure details:    Skin preparation:  Betadine Anesthesia (see MAR for exact dosages):    Anesthesia method:  Local infiltration   Local anesthetic:  Lidocaine 1% w/o epi Procedure type:    Complexity:  Simple Procedure details:    Incision types:  Stab incision   Incision depth:  Dermal   Scalpel blade:  11   Wound management:  Probed and deloculated and irrigated with saline   Drainage:  Purulent   Drainage amount:  Scant   Wound treatment:  Wound left open Post-procedure details:    Patient tolerance of procedure:  Tolerated well, no immediate complications   (including critical care time)  Medications Ordered in ED Medications  lidocaine (XYLOCAINE) 1 % (with pres) injection (not administered)    DIAGNOSTIC STUDIES:  Oxygen Saturation is 100% on RA, normal by my interpretation.     COORDINATION OF CARE:  1:18 AM Discussed treatment Logan with pt at bedside and pt agreed to Logan.  Initial Impression / Assessment and Logan / ED Course  I have reviewed the triage vital signs and the nursing notes.  Pertinent labs & imaging results that were available during my care of the patient were reviewed by me and considered in my medical decision making (see chart for details).  Clinical Course  Presents with tender swollen area on right side of neck consistent with early abscess. Incision and drainage performed and a small amount of purulent drainage was present. Patient will be placed on antibiotics, return as needed.  I personally performed the services described in this documentation, which was scribed in my presence. The recorded information has been reviewed and is accurate.   Final Clinical Impressions(s) / ED Diagnoses   Final diagnoses:  Abscess    New Prescriptions New Prescriptions   SULFAMETHOXAZOLE-TRIMETHOPRIM (BACTRIM DS,SEPTRA DS) 800-160 MG TABLET    Take 1 tablet by mouth 2 (two) times daily.     Maria Greek, MD 10/28/15 [phone]

## 2015-10-31 ENCOUNTER — Emergency Department (HOSPITAL_COMMUNITY)
Admission: EM | Admit: 2015-10-31 | Discharge: 2015-10-31 | Disposition: A | Discharge status: Home / Self Care | Payer: Medicaid Other | Attending: Emergency Medicine | Admitting: Emergency Medicine

## 2015-10-31 ENCOUNTER — Encounter (HOSPITAL_COMMUNITY): Payer: Self-pay

## 2015-10-31 DIAGNOSIS — Z79899 Other long term (current) drug therapy: Secondary | ICD-10-CM | POA: Diagnosis not present

## 2015-10-31 DIAGNOSIS — L02412 Cutaneous abscess of left axilla: Secondary | ICD-10-CM | POA: Diagnosis present

## 2015-10-31 DIAGNOSIS — L0291 Cutaneous abscess, unspecified: Secondary | ICD-10-CM

## 2015-10-31 MED ORDER — TRAMADOL HCL 50 MG PO TABS: 50.0000 mg | ORAL_TABLET | Freq: Four times a day (QID) | ORAL | 0 refills | Status: DC | PRN

## 2015-10-31 NOTE — ED Triage Notes (Signed)
Patient seen at [facility] 10/17 for same Patient states that the abscess on the right side of her neck is still "pussing".  The discharge is yellow in color and patient states that at times "it feels like something is crawling on her neck"  Patient also has another knot under her left arm that she is also worried about.  Denies fevers.  Patient still has the course of abx to finish.

## 2015-10-31 NOTE — ED Provider Notes (Signed)
[facility] DEPT Provider Note   CSN: [id number] Arrival date & time: 10/31/15  0442     History   Chief Complaint Chief Complaint  Patient presents with  . Abscess    HPI Maria Logan is a 50 y.o. female.  's 50 year old female who had an I and D of an abscess on the right side of her neck on the 17th.  She is concerned because it is still draining purulent material and is still painful.  She states she is taking her antibiotic as directed and using ibuprofen for pain control.  She also states that she is a new small area in her left axilla that is concerning to her.      Past Medical History:  Diagnosis Date  . Fibroids   . Umbilical hernia    CT 99991111  . Vaginal delivery 1988, 1990, 2005    Patient Active Problem List   Diagnosis Date Noted  . Night sweats 02/05/2015  . Abscess of female pelvis 01/09/2015  . Vaginal cuff cellulitis 01/01/2015  . Change in bowel function 12/30/2014  . S/P TAH (total abdominal hysterectomy) 12/24/2014    Past Surgical History:  Procedure Laterality Date  . ABDOMINAL HYSTERECTOMY N/A 12/23/2014   Procedure: HYSTERECTOMY ABDOMINAL;  Surgeon: Woodroe Mode, MD;  Location: [facility];  Service: Gynecology;  Laterality: N/A;  Requested 12/23/14 @ 1:00p  . BILATERAL SALPINGECTOMY Bilateral 12/23/2014   Procedure: BILATERAL SALPINGECTOMY;  Surgeon: Woodroe Mode, MD;  Location: [facility];  Service: Gynecology;  Laterality: Bilateral;  . EXAMINATION UNDER ANESTHESIA N/A 06/04/2014   Procedure: EXAM UNDER ANESTHESIA;  Surgeon: Guss Bunde, MD;  Location: [facility];  Service: Gynecology;  Laterality: N/A;  . OPERATIVE ULTRASOUND N/A 06/04/2014   Procedure: OPERATIVE ULTRASOUND;  Surgeon: Guss Bunde, MD;  Location: [facility];  Service: Gynecology;  Laterality: N/A;    OB History    Gravida Para Term Preterm AB Living   3 3 3    0 3   SAB TAB Ectopic Multiple Live Births   0               Home Medications    Prior to Admission  medications   Medication Sig Start Date End Date Taking? Authorizing Provider  cyclobenzaprine (FLEXERIL) 10 MG tablet Take 1 tablet (10 mg total) by mouth 2 (two) times daily as needed for muscle spasms. 08/06/15   Domenic Moras, PA-C  estradiol (ESTRACE) 1 MG tablet Take 1 tablet (1 mg total) by mouth daily. 02/05/15   Woodroe Mode, MD  ibuprofen (ADVIL,MOTRIN) 600 MG tablet Take 1 tablet (600 mg total) by mouth every 6 (six) hours as needed for moderate pain or cramping. 12/30/14   Lori A Clemmons, CNM  Multiple Vitamin (MULTIVITAMIN WITH MINERALS) TABS tablet Take 1 tablet by mouth daily.    Historical Provider, MD  naproxen (NAPROSYN) 500 MG tablet Take 1 tablet (500 mg total) by mouth 2 (two) times daily. 08/06/15   Domenic Moras, PA-C  ondansetron (ZOFRAN) 4 MG tablet Take 1 tablet (4 mg total) by mouth every 8 (eight) hours as needed for nausea or vomiting. 02/25/15   Courteney Lyn Mackuen, MD  saccharomyces boulardii (FLORASTOR) 250 MG capsule Take 1 capsule (250 mg total) by mouth 2 (two) times daily. 03/04/15   Amy S Esterwood, PA-C  sulfamethoxazole-trimethoprim (BACTRIM DS,SEPTRA DS) 800-160 MG tablet Take 1 tablet by mouth 2 (two) times daily. 10/28/15 11/04/15  [name], MD  traMADol Veatrice Bourbon)  50 MG tablet Take 1 tablet (50 mg total) by mouth every 6 (six) hours as needed for severe pain. 10/31/15   Junius Creamer, NP  vancomycin (VANCOCIN) 250 MG capsule Take 1 capsule (250 mg total) by mouth 4 (four) times daily. 03/04/15   Amy S Esterwood, PA-C  VOLTAREN 1 % GEL Apply to left knee up to 4 times daily as needed for pain. 12/05/14   Historical Provider, MD    Family History Family History  Problem Relation Age of Onset  . Alcohol abuse Neg Hx   . Arthritis Neg Hx   . Asthma Neg Hx   . Birth defects Neg Hx   . Cancer Neg Hx   . COPD Neg Hx   . Depression Neg Hx   . Diabetes Neg Hx   . Drug abuse Neg Hx   . Early death Neg Hx   . Hearing loss Neg Hx   . Heart disease Neg Hx    . Hyperlipidemia Neg Hx   . Hypertension Neg Hx   . Kidney disease Neg Hx   . Learning disabilities Neg Hx   . Mental illness Neg Hx   . Mental retardation Neg Hx   . Miscarriages / Stillbirths Neg Hx   . Stroke Neg Hx   . Vision loss Neg Hx   . Varicose Veins Neg Hx     Social History Social History  Substance Use Topics  . Smoking status: Never Smoker  . Smokeless tobacco: Never Used  . Alcohol use No     Allergies   Review of patient's allergies indicates no known allergies.   Review of Systems Review of Systems  Constitutional: Negative for fever.  Skin: Positive for wound.  Neurological: Negative for headaches.  All other systems reviewed and are negative.    Physical Exam Updated Vital Signs BP 101/56 (BP Location: Right Arm)   Pulse 78   Temp 97.6 F (36.4 C) (Oral)   Resp 18   LMP 12/09/2014 (Approximate)   SpO2 100%   Physical Exam  Constitutional: She appears well-developed and well-nourished. No distress.  HENT:  Head: Normocephalic and atraumatic.  Neck: Normal range of motion.    Cardiovascular: Normal rate.   Pulmonary/Chest: Effort normal.  Musculoskeletal: Normal range of motion.  Neurological: She is alert.  Skin: No erythema.  Patient has a small pea size firm nodule in the left axilla without any surrounding erythema.  No pertinent or fluctuance  Nursing note and vitals reviewed.    ED Treatments / Results  Labs (all labs ordered are listed, but only abnormal results are displayed) Labs Reviewed - No data to display  EKG  EKG Interpretation None       Radiology No results found.  Procedures Procedures (including critical care time)  Medications Ordered in ED Medications - No data to display   Initial Impression / Assessment and Plan / ED Course  I have reviewed the triage vital signs and the nursing notes.  Pertinent labs & imaging results that were available during my care of the patient were reviewed by me  and considered in my medical decision making (see chart for details).  Clinical Course     The eye indeed abscess on the neck seems to be healing well.  Patient has been encouraged to apply warm compresses and massage the area.  Continue taking the antibiotic until completed.  They've increased her pain control.  2.  Ultram and ibuprofen alternating each every 4-6 hours.  I also recommend that she apply warm compresses to the left axilla.  She's been given.  Return parameters  Final Clinical Impressions(s) / ED Diagnoses   Final diagnoses:  Abscess  Abscess of left axilla    New Prescriptions New Prescriptions   TRAMADOL (ULTRAM) 50 MG TABLET    Take 1 tablet (50 mg total) by mouth every 6 (six) hours as needed for severe pain.     Junius Creamer, NP 10/31/15 [place], MD 10/31/15 [phone]

## 2015-10-31 NOTE — Discharge Instructions (Signed)
I suggest applying a warm compress to the area 3-4 times a day, then gentle massage to assist drainage, you can expect to do this.  Next 2-3 days, then allow the area to heal naturally.  Make sure to continue taking her antibiotic until all tablets have been taken.  You have been given a prescription for medication called Ultram, which is a stronger pain medicine, you can take this every 6-8 hours as needed.  This can be alternated with ibuprofen or Naprosyn for better pain control. It appears as though you have a small abscess starting in your left axilla or armpit.  You can apply a warm compress to this area as well. The antibiotic should prevent this from getting to the point where it needs to be opened and drained.

## 2016-07-04 ENCOUNTER — Encounter (HOSPITAL_COMMUNITY): Payer: Self-pay | Admitting: *Deleted

## 2016-07-04 ENCOUNTER — Emergency Department (HOSPITAL_COMMUNITY): Payer: Medicaid Other

## 2016-07-04 ENCOUNTER — Emergency Department (HOSPITAL_COMMUNITY)
Admission: EM | Admit: 2016-07-04 | Discharge: 2016-07-04 | Disposition: A | Discharge status: Home / Self Care | Payer: Medicaid Other | Attending: Emergency Medicine | Admitting: Emergency Medicine

## 2016-07-04 DIAGNOSIS — Z77098 Contact with and (suspected) exposure to other hazardous, chiefly nonmedicinal, chemicals: Secondary | ICD-10-CM | POA: Diagnosis not present

## 2016-07-04 DIAGNOSIS — R911 Solitary pulmonary nodule: Secondary | ICD-10-CM | POA: Diagnosis not present

## 2016-07-04 DIAGNOSIS — Z79899 Other long term (current) drug therapy: Secondary | ICD-10-CM | POA: Diagnosis not present

## 2016-07-04 DIAGNOSIS — R05 Cough: Secondary | ICD-10-CM | POA: Diagnosis present

## 2016-07-04 DIAGNOSIS — J9801 Acute bronchospasm: Secondary | ICD-10-CM | POA: Insufficient documentation

## 2016-07-04 LAB — CBC WITH DIFFERENTIAL/PLATELET
Basophils Absolute: 0 10*3/uL (ref 0.0–0.1)
Basophils Relative: 0 %
EOS ABS: 0.3 10*3/uL (ref 0.0–0.7)
Eosinophils Relative: 7 %
HCT: 36.8 % (ref 36.0–46.0)
HEMOGLOBIN: 12.1 g/dL (ref 12.0–15.0)
LYMPHS PCT: 27 %
Lymphs Abs: 1.4 10*3/uL (ref 0.7–4.0)
MCH: 28.9 pg (ref 26.0–34.0)
MCHC: 32.9 g/dL (ref 30.0–36.0)
MCV: 88 fL (ref 78.0–100.0)
MONOS PCT: 10 %
Monocytes Absolute: 0.5 10*3/uL (ref 0.1–1.0)
NEUTROS PCT: 56 %
Neutro Abs: 2.9 10*3/uL (ref 1.7–7.7)
Platelets: 196 10*3/uL (ref 150–400)
RBC: 4.18 MIL/uL (ref 3.87–5.11)
RDW: 12.3 % (ref 11.5–15.5)
WBC: 5.2 10*3/uL (ref 4.0–10.5)

## 2016-07-04 LAB — URINALYSIS, ROUTINE W REFLEX MICROSCOPIC
Bilirubin Urine: NEGATIVE
Glucose, UA: NEGATIVE mg/dL
Ketones, ur: NEGATIVE mg/dL
Leukocytes, UA: NEGATIVE
Nitrite: NEGATIVE
PROTEIN: NEGATIVE mg/dL
SPECIFIC GRAVITY, URINE: 1.003 — AB (ref 1.005–1.030)
pH: 8 (ref 5.0–8.0)

## 2016-07-04 LAB — BASIC METABOLIC PANEL
ANION GAP: 5 (ref 5–15)
BUN: 10 mg/dL (ref 6–20)
CALCIUM: 8.5 mg/dL — AB (ref 8.9–10.3)
CO2: 25 mmol/L (ref 22–32)
Chloride: 107 mmol/L (ref 101–111)
Creatinine, Ser: 0.84 mg/dL (ref 0.44–1.00)
GFR calc Af Amer: >60 mL/min (ref >60)
GLUCOSE: 92 mg/dL (ref 65–99)
Potassium: 3.7 mmol/L (ref 3.5–5.1)
Sodium: 137 mmol/L (ref 135–145)

## 2016-07-04 MED ORDER — ALBUTEROL SULFATE HFA 108 (90 BASE) MCG/ACT IN AERS
1.0000 | INHALATION_SPRAY | RESPIRATORY_TRACT | Status: DC | PRN
  Administered 2016-07-04: 2 via RESPIRATORY_TRACT
  Filled 2016-07-04 (×2): qty 6.7

## 2016-07-04 MED ORDER — ONDANSETRON HCL 4 MG/2ML IJ SOLN
4.0000 mg | Freq: Once | INTRAMUSCULAR | Status: AC
  Administered 2016-07-04: 4 mg via INTRAVENOUS
  Filled 2016-07-04: qty 2

## 2016-07-04 MED ORDER — ALBUTEROL SULFATE (2.5 MG/3ML) 0.083% IN NEBU
2.5000 mg | INHALATION_SOLUTION | Freq: Once | RESPIRATORY_TRACT | Status: AC
  Administered 2016-07-04: 2.5 mg via RESPIRATORY_TRACT
  Filled 2016-07-04: qty 3

## 2016-07-04 MED ORDER — SODIUM CHLORIDE 0.9 % IV BOLUS (SEPSIS)
1000.0000 mL | Freq: Once | INTRAVENOUS | Status: AC
  Administered 2016-07-04: 1000 mL via INTRAVENOUS

## 2016-07-04 MED ORDER — PREDNISONE 10 MG (21) PO TBPK: ORAL_TABLET | ORAL | 0 refills | Status: DC

## 2016-07-04 MED ORDER — METHYLPREDNISOLONE SODIUM SUCC 125 MG IJ SOLR
125.0000 mg | Freq: Once | INTRAMUSCULAR | Status: AC
  Administered 2016-07-04: 125 mg via INTRAVENOUS
  Filled 2016-07-04: qty 2

## 2016-07-04 MED ORDER — IPRATROPIUM-ALBUTEROL 0.5-2.5 (3) MG/3ML IN SOLN
3.0000 mL | Freq: Once | RESPIRATORY_TRACT | Status: AC
  Administered 2016-07-04: 3 mL via RESPIRATORY_TRACT
  Filled 2016-07-04: qty 3

## 2016-07-04 MED ORDER — AEROCHAMBER PLUS FLO-VU MEDIUM MISC
1.0000 | Freq: Once | Status: AC
  Administered 2016-07-04: 1
  Filled 2016-07-04: qty 1

## 2016-07-04 NOTE — ED Provider Notes (Signed)
[facility] DEPT Provider Note   CSN: [id number] Arrival date & time: 07/04/16  7048     History   Chief Complaint Chief Complaint  Patient presents with  . Chemical Exposure    HPI Maria Logan is a 51 y.o. female.  Pt said she and her boyfriend were cleaning their house on Saturday morning (6/23).  She said she put ammonia into a container that already had bleach in it.  After that, there were horrible fumes.  Pt said the house air looked gray.  The pt said she opened all of the windows and left the house.  The pt said she keeps coughing and can't catch her breath.  The pt said it has not improved, so she came to the ED.      Past Medical History:  Diagnosis Date  . Fibroids   . Umbilical hernia    CT 8/89/16  . Vaginal delivery 1988, 1990, 2005    Patient Active Problem List   Diagnosis Date Noted  . Night sweats 02/05/2015  . Abscess of female pelvis 01/09/2015  . Vaginal cuff cellulitis 01/01/2015  . Change in bowel function 12/30/2014  . S/P TAH (total abdominal hysterectomy) 12/24/2014    Past Surgical History:  Procedure Laterality Date  . ABDOMINAL HYSTERECTOMY N/A 12/23/2014   Procedure: HYSTERECTOMY ABDOMINAL;  Surgeon: Woodroe Mode, MD;  Location: [facility];  Service: Gynecology;  Laterality: N/A;  Requested 12/23/14 @ 1:00p  . BILATERAL SALPINGECTOMY Bilateral 12/23/2014   Procedure: BILATERAL SALPINGECTOMY;  Surgeon: Woodroe Mode, MD;  Location: [facility];  Service: Gynecology;  Laterality: Bilateral;  . EXAMINATION UNDER ANESTHESIA N/A 06/04/2014   Procedure: EXAM UNDER ANESTHESIA;  Surgeon: Guss Bunde, MD;  Location: [facility];  Service: Gynecology;  Laterality: N/A;  . OPERATIVE ULTRASOUND N/A 06/04/2014   Procedure: OPERATIVE ULTRASOUND;  Surgeon: Guss Bunde, MD;  Location: [facility];  Service: Gynecology;  Laterality: N/A;    OB History    Gravida Para Term Preterm AB Living   3 3 3    0 3   SAB TAB Ectopic Multiple Live Births   0                Home Medications    Prior to Admission medications   Medication Sig Start Date End Date Taking? Authorizing Provider  ibuprofen (ADVIL,MOTRIN) 200 MG tablet Take 400 mg by mouth every 6 (six) hours as needed for mild pain or moderate pain.   Yes [provider]  cyclobenzaprine (FLEXERIL) 10 MG tablet Take 1 tablet (10 mg total) by mouth 2 (two) times daily as needed for muscle spasms. Patient not taking: Reported on 07/04/2016 08/06/15   Domenic Moras, PA-C  estradiol (ESTRACE) 1 MG tablet Take 1 tablet (1 mg total) by mouth daily. Patient not taking: Reported on 07/04/2016 02/05/15   Woodroe Mode, MD  ibuprofen (ADVIL,MOTRIN) 600 MG tablet Take 1 tablet (600 mg total) by mouth every 6 (six) hours as needed for moderate pain or cramping. Patient not taking: Reported on 07/04/2016 12/30/14   Clemmons, Bertram Gala, CNM  naproxen (NAPROSYN) 500 MG tablet Take 1 tablet (500 mg total) by mouth 2 (two) times daily. Patient not taking: Reported on 07/04/2016 08/06/15   Domenic Moras, PA-C  ondansetron (ZOFRAN) 4 MG tablet Take 1 tablet (4 mg total) by mouth every 8 (eight) hours as needed for nausea or vomiting. Patient not taking: Reported on 07/04/2016 02/25/15   Macarthur Critchley, MD  predniSONE (  STERAPRED UNI-PAK 21 TAB) 10 MG (21) TBPK tablet Take 6 tabs by mouth daily  for 2 days, then 5 tabs for 2 days, then 4 tabs for 2 days, then 3 tabs for 2 days, 2 tabs for 2 days, then 1 tab by mouth daily for 2 days 07/04/16   Isla Pence, MD  saccharomyces boulardii (FLORASTOR) 250 MG capsule Take 1 capsule (250 mg total) by mouth 2 (two) times daily. Patient not taking: Reported on 07/04/2016 03/04/15   Esterwood, Amy S, PA-C  traMADol (ULTRAM) 50 MG tablet Take 1 tablet (50 mg total) by mouth every 6 (six) hours as needed for severe pain. Patient not taking: Reported on 07/04/2016 10/31/15   Junius Creamer, NP  vancomycin (VANCOCIN) 250 MG capsule Take 1 capsule (250 mg total) by mouth 4  (four) times daily. Patient not taking: Reported on 07/04/2016 03/04/15   Alfredia Ferguson, PA-C    Family History Family History  Problem Relation Age of Onset  . Alcohol abuse Neg Hx   . Arthritis Neg Hx   . Asthma Neg Hx   . Birth defects Neg Hx   . Cancer Neg Hx   . COPD Neg Hx   . Depression Neg Hx   . Diabetes Neg Hx   . Drug abuse Neg Hx   . Early death Neg Hx   . Hearing loss Neg Hx   . Heart disease Neg Hx   . Hyperlipidemia Neg Hx   . Hypertension Neg Hx   . Kidney disease Neg Hx   . Learning disabilities Neg Hx   . Mental illness Neg Hx   . Mental retardation Neg Hx   . Miscarriages / Stillbirths Neg Hx   . Stroke Neg Hx   . Vision loss Neg Hx   . Varicose Veins Neg Hx     Social History Social History  Substance Use Topics  . Smoking status: Never Smoker  . Smokeless tobacco: Never Used  . Alcohol use No     Allergies   Patient has no known allergies.   Review of Systems Review of Systems  Respiratory: Positive for cough, chest tightness and shortness of breath.   All other systems reviewed and are negative.    Physical Exam Updated Vital Signs BP 103/72   Pulse 78   Temp 98 F (36.7 C)   Resp 15   Ht 5\' 6"  (1.676 m)   Wt 83.9 kg (185 lb)   LMP 12/09/2014 (Approximate)   SpO2 96%   BMI 29.86 kg/m   Physical Exam  Constitutional: She is oriented to person, place, and time. She appears well-developed and well-nourished.  HENT:  Head: Normocephalic and atraumatic.  Right Ear: External ear normal.  Left Ear: External ear normal.  Nose: Nose normal.  Mouth/Throat: Oropharynx is clear and moist.  Eyes: Conjunctivae and EOM are normal. Pupils are equal, round, and reactive to light.  Neck: Normal range of motion. Neck supple.  Cardiovascular: Normal rate, regular rhythm, normal heart sounds and intact distal pulses.   Pulmonary/Chest: Effort normal. She has wheezes.  Abdominal: Soft. Bowel sounds are normal.  Musculoskeletal: Normal  range of motion.  Neurological: She is alert and oriented to person, place, and time.  Skin: Skin is warm.  Psychiatric: She has a normal mood and affect. Her behavior is normal. Judgment and thought content normal.  Nursing note and vitals reviewed.    ED Treatments / Results  Labs (all labs ordered are listed, but only  abnormal results are displayed) Labs Reviewed  BASIC METABOLIC PANEL - Abnormal; Notable for the following:       Result Value   Calcium 8.5 (*)    All other components within normal limits  URINALYSIS, ROUTINE W REFLEX MICROSCOPIC - Abnormal; Notable for the following:    Color, Urine STRAW (*)    Specific Gravity, Urine 1.003 (*)    Hgb urine dipstick MODERATE (*)    Bacteria, UA RARE (*)    Squamous Epithelial / LPF 0-5 (*)    All other components within normal limits  CBC WITH DIFFERENTIAL/PLATELET    EKG  EKG Interpretation None       Radiology Dg Chest 2 View  Result Date: 07/04/2016 CLINICAL DATA:  Cough.  Shortness of breath . EXAM: CHEST  2 VIEW COMPARISON:  06/30/2009.  08/09/2004.a FINDINGS: Mediastinum and hilar structures normal. Subcentimeter nodular density projected over the right upper lobe is noted. Nonenhanced chest CT is suggested to further evaluate. No pleural effusion or pneumothorax. Heart size normal. No acute bony abnormality . No pleural effusion or pneumothorax. Heart size normal. No acute bony abnormality . IMPRESSION: Small subcentimeter nodular density noted over the right upper lobe. Nonenhanced chest CT suggested for further evaluation. Exam otherwise unremarkable . Electronically Signed   By: Marcello Moores  Register   On: 07/04/2016 09:46    Procedures Procedures (including critical care time)  Medications Ordered in ED Medications  albuterol (PROVENTIL HFA;VENTOLIN HFA) 108 (90 Base) MCG/ACT inhaler 1-2 puff (not administered)  sodium chloride 0.9 % bolus 1,000 mL (1,000 mLs Intravenous New Bag/Given 07/04/16 0912)    methylPREDNISolone sodium succinate (SOLU-MEDROL) 125 mg/2 mL injection 125 mg (125 mg Intravenous Given 07/04/16 0912)  ipratropium-albuterol (DUONEB) 0.5-2.5 (3) MG/3ML nebulizer solution 3 mL (3 mLs Nebulization Given 07/04/16 0912)  ondansetron (ZOFRAN) injection 4 mg (4 mg Intravenous Given 07/04/16 0949)  albuterol (PROVENTIL) (2.5 MG/3ML) 0.083% nebulizer solution 2.5 mg (2.5 mg Nebulization Given 07/04/16 1047)  AEROCHAMBER PLUS FLO-VU MEDIUM MISC 1 each (1 each Other Given 07/04/16 1052)     Initial Impression / Assessment and Plan / ED Course  I have reviewed the triage vital signs and the nursing notes.  Pertinent labs & imaging results that were available during my care of the patient were reviewed by me and considered in my medical decision making (see chart for details).    Pt is feeling much better after nebs and solumedrol.  She is given an inhaler for home.  She knows to avoid the house if fumes are still present.  She is told of the pulmonary nodule and the need for outpatient f/u.  She knows to return if worse.  Final Clinical Impressions(s) / ED Diagnoses   Final diagnoses:  Chemical exposure  Bronchospasm  Lung nodule    New Prescriptions New Prescriptions   PREDNISONE (STERAPRED UNI-PAK 21 TAB) 10 MG (21) TBPK TABLET    Take 6 tabs by mouth daily  for 2 days, then 5 tabs for 2 days, then 4 tabs for 2 days, then 3 tabs for 2 days, 2 tabs for 2 days, then 1 tab by mouth daily for 2 days     Isla Pence, MD 07/04/16 1118

## 2016-07-04 NOTE — Discharge Instructions (Signed)
Non emergent CT chest as an outpatient to follow pulmonary nodule.

## 2016-07-04 NOTE — ED Triage Notes (Signed)
Patient is alert and oriented x4.  She is complaining of throat and chest tightness after being exposed to ammonia and bleach while cleaning her house on Saturday.  Currently she rates her tightness a 8 of 10.

## 2016-07-18 ENCOUNTER — Ambulatory Visit: Payer: Medicaid Other | Admitting: Obstetrics & Gynecology

## 2016-08-11 ENCOUNTER — Ambulatory Visit: Payer: Medicaid Other | Admitting: Obstetrics & Gynecology

## 2016-08-20 ENCOUNTER — Emergency Department (HOSPITAL_COMMUNITY): Payer: Medicaid Other

## 2016-08-20 ENCOUNTER — Emergency Department (HOSPITAL_COMMUNITY)
Admission: EM | Admit: 2016-08-20 | Discharge: 2016-08-20 | Disposition: A | Discharge status: Home / Self Care | Payer: Medicaid Other | Attending: Emergency Medicine | Admitting: Emergency Medicine

## 2016-08-20 ENCOUNTER — Encounter (HOSPITAL_COMMUNITY): Payer: Self-pay | Admitting: *Deleted

## 2016-08-20 DIAGNOSIS — R202 Paresthesia of skin: Secondary | ICD-10-CM | POA: Diagnosis present

## 2016-08-20 DIAGNOSIS — R200 Anesthesia of skin: Secondary | ICD-10-CM | POA: Diagnosis not present

## 2016-08-20 LAB — URINALYSIS, ROUTINE W REFLEX MICROSCOPIC
BACTERIA UA: NONE SEEN
BILIRUBIN URINE: NEGATIVE
Glucose, UA: NEGATIVE mg/dL
Ketones, ur: NEGATIVE mg/dL
LEUKOCYTES UA: NEGATIVE
Nitrite: NEGATIVE
PROTEIN: NEGATIVE mg/dL
Specific Gravity, Urine: 1.014 (ref 1.005–1.030)
pH: 6 (ref 5.0–8.0)

## 2016-08-20 LAB — CBC WITH DIFFERENTIAL/PLATELET
BASOS ABS: 0 10*3/uL (ref 0.0–0.1)
Basophils Relative: 0 %
EOS ABS: 0.2 10*3/uL (ref 0.0–0.7)
EOS PCT: 3 %
HCT: 38.9 % (ref 36.0–46.0)
Hemoglobin: 12.9 g/dL (ref 12.0–15.0)
LYMPHS PCT: 28 %
Lymphs Abs: 1.6 10*3/uL (ref 0.7–4.0)
MCH: 29.5 pg (ref 26.0–34.0)
MCHC: 33.2 g/dL (ref 30.0–36.0)
MCV: 88.8 fL (ref 78.0–100.0)
MONO ABS: 0.5 10*3/uL (ref 0.1–1.0)
Monocytes Relative: 8 %
Neutro Abs: 3.7 10*3/uL (ref 1.7–7.7)
Neutrophils Relative %: 61 %
PLATELETS: 194 10*3/uL (ref 150–400)
RBC: 4.38 MIL/uL (ref 3.87–5.11)
RDW: 12.2 % (ref 11.5–15.5)
WBC: 6 10*3/uL (ref 4.0–10.5)

## 2016-08-20 LAB — COMPREHENSIVE METABOLIC PANEL
ALT: 18 U/L (ref 14–54)
ANION GAP: 8 (ref 5–15)
AST: 22 U/L (ref 15–41)
Albumin: 3.7 g/dL (ref 3.5–5.0)
Alkaline Phosphatase: 53 U/L (ref 38–126)
BUN: 14 mg/dL (ref 6–20)
CHLORIDE: 107 mmol/L (ref 101–111)
CO2: 24 mmol/L (ref 22–32)
CREATININE: 1.01 mg/dL — AB (ref 0.44–1.00)
Calcium: 9 mg/dL (ref 8.9–10.3)
GFR calc non Af Amer: >60 mL/min (ref >60)
Glucose, Bld: 97 mg/dL (ref 65–99)
POTASSIUM: 3.9 mmol/L (ref 3.5–5.1)
SODIUM: 139 mmol/L (ref 135–145)
Total Bilirubin: 0.5 mg/dL (ref 0.3–1.2)
Total Protein: 7.1 g/dL (ref 6.5–8.1)

## 2016-08-20 MED ORDER — LORAZEPAM 2 MG/ML IJ SOLN
1.0000 mg | Freq: Once | INTRAMUSCULAR | Status: AC
  Administered 2016-08-20: 1 mg via INTRAVENOUS
  Filled 2016-08-20: qty 1

## 2016-08-20 NOTE — ED Triage Notes (Signed)
Pt states that she has not been feeling well for the past two days.  Pt reports she feels "hot" inside and tries to fan herself.  Her arms feel tired and numb for the past twos days. Pt states her arms feel like they are asleep.   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 Pt endorses nausea but is able tolerate PO fluids.  Pt denies pain.

## 2016-08-20 NOTE — Discharge Instructions (Signed)
CT scan of head and CT scan of cervical spine were normal.Additionally, your blood work was good. Follow-up with your primary care doctor.

## 2016-08-20 NOTE — ED Provider Notes (Signed)
[facility] DEPT Provider Note   CSN: [id number] Arrival date & time: 08/20/16  1435     History   Chief Complaint No chief complaint on file.   HPI Maria Logan is a 51 y.o. female.  Patient presents with lightheadedness, nausea, tingling in both arms for 2 days. She is normally healthy and takes no medications on a regular basis. No alcohol or street drugs. 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 No motor deficits, confusion, facial asymmetry, speech difficulty. She is ambulatory.      Past Medical History:  Diagnosis Date  . Fibroids   . Umbilical hernia    CT 7/67/34  . Vaginal delivery 1988, 1990, 2005    Patient Active Problem List   Diagnosis Date Noted  . Night sweats 02/05/2015  . Abscess of female pelvis 01/09/2015  . Vaginal cuff cellulitis 01/01/2015  . Change in bowel function 12/30/2014  . S/P TAH (total abdominal hysterectomy) 12/24/2014    Past Surgical History:  Procedure Laterality Date  . ABDOMINAL HYSTERECTOMY N/A 12/23/2014   Procedure: HYSTERECTOMY ABDOMINAL;  Surgeon: Woodroe Mode, MD;  Location: [facility];  Service: Gynecology;  Laterality: N/A;  Requested 12/23/14 @ 1:00p  . BILATERAL SALPINGECTOMY Bilateral 12/23/2014   Procedure: BILATERAL SALPINGECTOMY;  Surgeon: Woodroe Mode, MD;  Location: [facility];  Service: Gynecology;  Laterality: Bilateral;  . EXAMINATION UNDER ANESTHESIA N/A 06/04/2014   Procedure: EXAM UNDER ANESTHESIA;  Surgeon: Guss Bunde, MD;  Location: [facility];  Service: Gynecology;  Laterality: N/A;  . OPERATIVE ULTRASOUND N/A 06/04/2014   Procedure: OPERATIVE ULTRASOUND;  Surgeon: Guss Bunde, MD;  Location: [facility];  Service: Gynecology;  Laterality: N/A;    OB History    Gravida Para Term Preterm AB Living   3 3 3    0 3   SAB TAB Ectopic Multiple Live Births   0               Home Medications    Prior to Admission medications   Medication Sig Start Date End Date Taking? Authorizing Provider    diclofenac sodium (VOLTAREN) 1 % GEL Apply 2 g topically daily as needed (knee pain).   Yes [provider]  ibuprofen (ADVIL,MOTRIN) 200 MG tablet Take 800 mg by mouth every 6 (six) hours as needed for mild pain or moderate pain.    Yes [provider]  cyclobenzaprine (FLEXERIL) 10 MG tablet Take 1 tablet (10 mg total) by mouth 2 (two) times daily as needed for muscle spasms. Patient not taking: Reported on 07/04/2016 08/06/15   Domenic Moras, PA-C  estradiol (ESTRACE) 1 MG tablet Take 1 tablet (1 mg total) by mouth daily. Patient not taking: Reported on 07/04/2016 02/05/15   Woodroe Mode, MD  ibuprofen (ADVIL,MOTRIN) 600 MG tablet Take 1 tablet (600 mg total) by mouth every 6 (six) hours as needed for moderate pain or cramping. Patient not taking: Reported on 07/04/2016 12/30/14   Clemmons, Bertram Gala, CNM  naproxen (NAPROSYN) 500 MG tablet Take 1 tablet (500 mg total) by mouth 2 (two) times daily. Patient not taking: Reported on 07/04/2016 08/06/15   Domenic Moras, PA-C  ondansetron (ZOFRAN) 4 MG tablet Take 1 tablet (4 mg total) by mouth every 8 (eight) hours as needed for nausea or vomiting. Patient not taking: Reported on 07/04/2016 02/25/15   Mackuen, Courteney Lyn, MD  predniSONE (STERAPRED UNI-PAK 21 TAB) 10 MG (21) TBPK tablet Take 6 tabs by mouth daily  for  2 days, then 5 tabs for 2 days, then 4 tabs for 2 days, then 3 tabs for 2 days, 2 tabs for 2 days, then 1 tab by mouth daily for 2 days Patient not taking: Reported on 08/20/2016 07/04/16   Isla Pence, MD  saccharomyces boulardii (FLORASTOR) 250 MG capsule Take 1 capsule (250 mg total) by mouth 2 (two) times daily. Patient not taking: Reported on 07/04/2016 03/04/15   Esterwood, Amy S, PA-C  traMADol (ULTRAM) 50 MG tablet Take 1 tablet (50 mg total) by mouth every 6 (six) hours as needed for severe pain. Patient not taking: Reported on 07/04/2016 10/31/15   Junius Creamer, NP  vancomycin (VANCOCIN) 250 MG capsule Take 1 capsule  (250 mg total) by mouth 4 (four) times daily. Patient not taking: Reported on 07/04/2016 03/04/15   Alfredia Ferguson, PA-C    Family History Family History  Problem Relation Age of Onset  . Alcohol abuse Neg Hx   . Arthritis Neg Hx   . Asthma Neg Hx   . Birth defects Neg Hx   . Cancer Neg Hx   . COPD Neg Hx   . Depression Neg Hx   . Diabetes Neg Hx   . Drug abuse Neg Hx   . Early death Neg Hx   . Hearing loss Neg Hx   . Heart disease Neg Hx   . Hyperlipidemia Neg Hx   . Hypertension Neg Hx   . Kidney disease Neg Hx   . Learning disabilities Neg Hx   . Mental illness Neg Hx   . Mental retardation Neg Hx   . Miscarriages / Stillbirths Neg Hx   . Stroke Neg Hx   . Vision loss Neg Hx   . Varicose Veins Neg Hx     Social History Social History  Substance Use Topics  . Smoking status: Never Smoker  . Smokeless tobacco: Never Used  . Alcohol use No     Allergies   Patient has no known allergies.   Review of Systems Review of Systems  All other systems reviewed and are negative.    Physical Exam Updated Vital Signs BP 136/77   Pulse (!) 58   Temp 98.2 F (36.8 C) (Oral)   Resp 18   LMP 12/09/2014 (Approximate)   SpO2 99%   Physical Exam  Constitutional: She is oriented to person, place, and time. She appears well-developed and well-nourished.  HENT:  Head: Normocephalic and atraumatic.  Eyes: Conjunctivae are normal.  Neck: Neck supple.  Cardiovascular: Normal rate and regular rhythm.   Pulmonary/Chest: Effort normal and breath sounds normal.  Abdominal: Soft. Bowel sounds are normal.  Musculoskeletal: Normal range of motion.  Neurological: She is alert and oriented to person, place, and time.  Patient has full range of motion of both arms; however she says they are numb and tingly.  Skin: Skin is warm and dry.  Psychiatric: She has a normal mood and affect. Her behavior is normal.  Nursing note and vitals reviewed.    ED Treatments / Results   Labs (all labs ordered are listed, but only abnormal results are displayed) Labs Reviewed  COMPREHENSIVE METABOLIC PANEL - Abnormal; Notable for the following:       Result Value   Creatinine, Ser 1.01 (*)    All other components within normal limits  URINALYSIS, ROUTINE W REFLEX MICROSCOPIC - Abnormal; Notable for the following:    Hgb urine dipstick LARGE (*)    Squamous Epithelial / LPF 6-30 (*)  All other components within normal limits  CBC WITH DIFFERENTIAL/PLATELET    EKG  EKG Interpretation None       Radiology Ct Head Wo Contrast  Result Date: 08/20/2016 CLINICAL DATA:  Ataxia EXAM: CT HEAD WITHOUT CONTRAST TECHNIQUE: Contiguous axial images were obtained from the base of the skull through the vertex without intravenous contrast. COMPARISON:  None. FINDINGS: Brain: No evidence of acute infarction, hemorrhage, hydrocephalus, extra-axial collection or mass lesion/mass effect. Vascular: No hyperdense vessel or unexpected calcification. Skull: Negative Sinuses/Orbits: Negative Other: None IMPRESSION: Negative CT head Electronically Signed   By: Franchot Gallo M.D.   On: 08/20/2016 18:24   Ct Cervical Spine Wo Contrast  Result Date: 08/20/2016 CLINICAL DATA:  Acute onset of bilateral hand numbness for 3 days. Initial encounter. EXAM: CT CERVICAL SPINE WITHOUT CONTRAST TECHNIQUE: Multidetector CT imaging of the cervical spine was performed without intravenous contrast. Multiplanar CT image reconstructions were also generated. COMPARISON:  MRI of the cervical spine performed 05/10/2010 FINDINGS: Alignment: Normal. Skull base and vertebrae: No acute fracture. No primary bone lesion or focal pathologic process. Soft tissues and spinal canal: No prevertebral fluid or swelling. No visible canal hematoma. Disc levels: Intervertebral disc spaces are preserved. Small anterior and posterior disc osteophyte complexes are noted at the mid cervical spine. Upper chest: The visualized lung  apices are clear. The thyroid gland is unremarkable. Other: No additional soft tissue abnormalities are seen. The visualized portions of the brain are unremarkable. IMPRESSION: 1. No evidence of fracture or subluxation along the cervical spine. 2. Mild degenerative change at the mid cervical spine. Electronically Signed   By: Garald Balding M.D.   On: 08/20/2016 21:20    Procedures Procedures (including critical care time)  Medications Ordered in ED Medications  LORazepam (ATIVAN) injection 1 mg (1 mg Intravenous Given 08/20/16 2116)     Initial Impression / Assessment and Plan / ED Course  I have reviewed the triage vital signs and the nursing notes.  Pertinent labs & imaging results that were available during my care of the patient were reviewed by me and considered in my medical decision making (see chart for details).     Patient is alert and oriented 3 with a normal neuro exam. Screening tests including labs, EKG, CT head, CT cervical spine show no acute findings. This was discussed with the patient detail. She will seek primary care follow-up or return if worse.  Final Clinical Impressions(s) / ED Diagnoses   Final diagnoses:  Numbness and tingling in left arm  Numbness and tingling of right arm    New Prescriptions New Prescriptions   No medications on file     Nat Christen, MD 08/20/16 2150

## 2016-10-07 DIAGNOSIS — Z23 Encounter for immunization: Secondary | ICD-10-CM | POA: Diagnosis not present

## 2016-10-07 DIAGNOSIS — M25569 Pain in unspecified knee: Secondary | ICD-10-CM | POA: Diagnosis not present

## 2016-10-07 DIAGNOSIS — Z131 Encounter for screening for diabetes mellitus: Secondary | ICD-10-CM | POA: Diagnosis not present

## 2016-10-07 DIAGNOSIS — Z13220 Encounter for screening for lipoid disorders: Secondary | ICD-10-CM | POA: Diagnosis not present

## 2016-10-07 DIAGNOSIS — Z1239 Encounter for other screening for malignant neoplasm of breast: Secondary | ICD-10-CM | POA: Diagnosis not present

## 2016-10-07 DIAGNOSIS — N760 Acute vaginitis: Secondary | ICD-10-CM | POA: Diagnosis not present

## 2017-03-09 ENCOUNTER — Emergency Department (HOSPITAL_COMMUNITY)
Admission: EM | Admit: 2017-03-09 | Discharge: 2017-03-10 | Disposition: A | Discharge status: Home / Self Care | Payer: Medicaid Other | Attending: Emergency Medicine | Admitting: Emergency Medicine

## 2017-03-09 ENCOUNTER — Encounter (HOSPITAL_COMMUNITY): Payer: Self-pay | Admitting: *Deleted

## 2017-03-09 DIAGNOSIS — R200 Anesthesia of skin: Secondary | ICD-10-CM | POA: Diagnosis present

## 2017-03-09 DIAGNOSIS — Z5321 Procedure and treatment not carried out due to patient leaving prior to being seen by health care provider: Secondary | ICD-10-CM | POA: Diagnosis not present

## 2017-03-09 NOTE — ED Triage Notes (Signed)
Pt complains of bilateral hand tingling, occasional numbness in her feet and feels like she is going to "tip over" when she's standing. Pt denies dizziness.

## 2017-03-09 NOTE — ED Notes (Signed)
Pt called for recheck vs x2, no response from lobby

## 2017-03-10 NOTE — ED Notes (Signed)
03/10/2017  Attempted follow-up call, no answer.

## 2017-03-13 ENCOUNTER — Emergency Department (HOSPITAL_COMMUNITY): Payer: Medicaid Other

## 2017-03-13 ENCOUNTER — Encounter (HOSPITAL_COMMUNITY): Payer: Self-pay

## 2017-03-13 ENCOUNTER — Emergency Department (HOSPITAL_COMMUNITY)
Admission: EM | Admit: 2017-03-13 | Discharge: 2017-03-13 | Disposition: A | Discharge status: Home / Self Care | Payer: Medicaid Other | Attending: Emergency Medicine | Admitting: Emergency Medicine

## 2017-03-13 ENCOUNTER — Other Ambulatory Visit: Payer: Self-pay

## 2017-03-13 DIAGNOSIS — H9319 Tinnitus, unspecified ear: Secondary | ICD-10-CM | POA: Insufficient documentation

## 2017-03-13 DIAGNOSIS — R200 Anesthesia of skin: Secondary | ICD-10-CM | POA: Insufficient documentation

## 2017-03-13 DIAGNOSIS — Z8742 Personal history of other diseases of the female genital tract: Secondary | ICD-10-CM | POA: Diagnosis not present

## 2017-03-13 DIAGNOSIS — R42 Dizziness and giddiness: Secondary | ICD-10-CM | POA: Diagnosis not present

## 2017-03-13 DIAGNOSIS — R202 Paresthesia of skin: Secondary | ICD-10-CM | POA: Diagnosis not present

## 2017-03-13 DIAGNOSIS — Z90710 Acquired absence of both cervix and uterus: Secondary | ICD-10-CM | POA: Insufficient documentation

## 2017-03-13 DIAGNOSIS — I4892 Unspecified atrial flutter: Secondary | ICD-10-CM | POA: Diagnosis not present

## 2017-03-13 DIAGNOSIS — R002 Palpitations: Secondary | ICD-10-CM | POA: Diagnosis not present

## 2017-03-13 LAB — CBC
HCT: 39.8 % (ref 36.0–46.0)
HEMOGLOBIN: 12.7 g/dL (ref 12.0–15.0)
MCH: 29.7 pg (ref 26.0–34.0)
MCHC: 31.9 g/dL (ref 30.0–36.0)
MCV: 93 fL (ref 78.0–100.0)
Platelets: 226 10*3/uL (ref 150–400)
RBC: 4.28 MIL/uL (ref 3.87–5.11)
RDW: 12.7 % (ref 11.5–15.5)
WBC: 4.9 10*3/uL (ref 4.0–10.5)

## 2017-03-13 LAB — COMPREHENSIVE METABOLIC PANEL
ALT: 16 U/L (ref 14–54)
ANION GAP: 8 (ref 5–15)
AST: 22 U/L (ref 15–41)
Albumin: 3.6 g/dL (ref 3.5–5.0)
Alkaline Phosphatase: 57 U/L (ref 38–126)
BILIRUBIN TOTAL: 0.6 mg/dL (ref 0.3–1.2)
BUN: 15 mg/dL (ref 6–20)
CALCIUM: 8.9 mg/dL (ref 8.9–10.3)
CO2: 24 mmol/L (ref 22–32)
CREATININE: 1 mg/dL (ref 0.44–1.00)
Chloride: 107 mmol/L (ref 101–111)
Glucose, Bld: 90 mg/dL (ref 65–99)
Potassium: 3.9 mmol/L (ref 3.5–5.1)
Sodium: 139 mmol/L (ref 135–145)
TOTAL PROTEIN: 7 g/dL (ref 6.5–8.1)

## 2017-03-13 LAB — I-STAT TROPONIN, ED: Troponin i, poc: 0 ng/mL (ref 0.00–0.08)

## 2017-03-13 LAB — DIFFERENTIAL
Basophils Absolute: 0 10*3/uL (ref 0.0–0.1)
Basophils Relative: 0 %
EOS PCT: 1 %
Eosinophils Absolute: 0 10*3/uL (ref 0.0–0.7)
LYMPHS PCT: 25 %
Lymphs Abs: 1.2 10*3/uL (ref 0.7–4.0)
MONO ABS: 0.5 10*3/uL (ref 0.1–1.0)
MONOS PCT: 9 %
NEUTROS ABS: 3.2 10*3/uL (ref 1.7–7.7)
Neutrophils Relative %: 65 %

## 2017-03-13 LAB — URINALYSIS, ROUTINE W REFLEX MICROSCOPIC
BILIRUBIN URINE: NEGATIVE
Bacteria, UA: NONE SEEN
Glucose, UA: NEGATIVE mg/dL
KETONES UR: NEGATIVE mg/dL
LEUKOCYTES UA: NEGATIVE
NITRITE: NEGATIVE
Protein, ur: NEGATIVE mg/dL
SPECIFIC GRAVITY, URINE: 1.004 — AB (ref 1.005–1.030)
pH: 7 (ref 5.0–8.0)

## 2017-03-13 LAB — APTT: APTT: 27 s (ref 24–36)

## 2017-03-13 LAB — RAPID URINE DRUG SCREEN, HOSP PERFORMED
AMPHETAMINES: NOT DETECTED
Barbiturates: NOT DETECTED
Benzodiazepines: NOT DETECTED
Cocaine: NOT DETECTED
OPIATES: NOT DETECTED
Tetrahydrocannabinol: NOT DETECTED

## 2017-03-13 LAB — ETHANOL: Alcohol, Ethyl (B): <10 mg/dL (ref <10)

## 2017-03-13 LAB — PROTIME-INR
INR: 1
Prothrombin Time: 13.1 seconds (ref 11.4–15.2)

## 2017-03-13 LAB — CBG MONITORING, ED: GLUCOSE-CAPILLARY: 76 mg/dL (ref 65–99)

## 2017-03-13 MED ORDER — DIPHENHYDRAMINE HCL 50 MG/ML IJ SOLN
25.0000 mg | Freq: Once | INTRAMUSCULAR | Status: AC
  Administered 2017-03-13: 25 mg via INTRAVENOUS
  Filled 2017-03-13: qty 1

## 2017-03-13 MED ORDER — MECLIZINE HCL 25 MG PO TABS: 25.0000 mg | ORAL_TABLET | Freq: Three times a day (TID) | ORAL | 0 refills | Status: DC | PRN

## 2017-03-13 MED ORDER — GADOBENATE DIMEGLUMINE 529 MG/ML IV SOLN
20.0000 mL | Freq: Once | INTRAVENOUS | Status: AC | PRN
  Administered 2017-03-13: 15 mL via INTRAVENOUS

## 2017-03-13 NOTE — ED Notes (Signed)
Pt assisted to bathroom with RN assistance

## 2017-03-13 NOTE — ED Notes (Signed)
Off floor for MRI.

## 2017-03-13 NOTE — ED Notes (Signed)
RN rounded on pt and informed pt we would be getting her a lunch tray with MD approval.

## 2017-03-13 NOTE — ED Provider Notes (Signed)
[facility] DEPT Provider Note   CSN: [id number] Arrival date & time: 03/13/17  5916     History   Chief Complaint Chief Complaint  Patient presents with  . Numbness    left arm    HPI Maria Logan is a 52 y.o. female.  HPI   52yo female with history of fibroids with hysterectomy presents with concern for intermittent numbness of left arm and bilateral feet for one week and sensation of being off balance.  Reports she doesn't feel dizzy, but feels as if she is off balance and like she will tip over when standing.  Will get up and sometimes both feel feel cold.  Holds onto the banister and goes doewn the stairs, at some point feels like whole body shifting to the left, feeling off balance, then will sit and feels a little better.  Packing boxes at work, feels like forgetting to count.  Sometimes feels palpitations, flutter.  Has been a week, maybe a little more.  Reports it is constant throughout the day but waxing and waning.  Feels like both feet feel cold and numb, worse on the left.  Feels like in the feet is both with laying down.  Left arm numbness and tingling is constant but waxing and waning.  No change in vision, wears glasses.  No difficulty talking.  Not feeling dizzy but feeling totally off balance.  No falls.  No fevers or chest pain.  No shortness of breath, feels different when having heart palpitations.  No headache.  Occasional ear ringing.   No known medical problems, does have PCP and has been a while No known cholesterol, htn, DM.   No smoking, no etoh, no other drugs  No family hx of CAD or stroke.   Past Medical History:  Diagnosis Date  . Fibroids   . Umbilical hernia    CT 3/84/66  . Vaginal delivery 1988, 1990, 2005    Patient Active Problem List   Diagnosis Date Noted  . Night sweats 02/05/2015  . Abscess of female pelvis 01/09/2015  . Vaginal cuff cellulitis 01/01/2015  . Change in bowel function 12/30/2014  . S/P  TAH (total abdominal hysterectomy) 12/24/2014    Past Surgical History:  Procedure Laterality Date  . ABDOMINAL HYSTERECTOMY N/A 12/23/2014   Procedure: HYSTERECTOMY ABDOMINAL;  Surgeon: Woodroe Mode, MD;  Location: [facility];  Service: Gynecology;  Laterality: N/A;  Requested 12/23/14 @ 1:00p  . BILATERAL SALPINGECTOMY Bilateral 12/23/2014   Procedure: BILATERAL SALPINGECTOMY;  Surgeon: Woodroe Mode, MD;  Location: [facility];  Service: Gynecology;  Laterality: Bilateral;  . EXAMINATION UNDER ANESTHESIA N/A 06/04/2014   Procedure: EXAM UNDER ANESTHESIA;  Surgeon: Guss Bunde, MD;  Location: [facility];  Service: Gynecology;  Laterality: N/A;  . OPERATIVE ULTRASOUND N/A 06/04/2014   Procedure: OPERATIVE ULTRASOUND;  Surgeon: Guss Bunde, MD;  Location: [facility];  Service: Gynecology;  Laterality: N/A;    OB History    Gravida Para Term Preterm AB Living   3 3 3    0 3   SAB TAB Ectopic Multiple Live Births   0               Home Medications    Prior to Admission medications   Medication Sig Start Date End Date Taking? Authorizing Provider  ibuprofen (ADVIL,MOTRIN) 200 MG tablet Take 400-800 mg by mouth every 6 (six) hours as needed (For pain.).    Yes [provider]  meclizine (  ANTIVERT) 25 MG tablet Take 1 tablet (25 mg total) by mouth 3 (three) times daily as needed for dizziness. 03/13/17   Gareth Morgan, MD    Family History Family History  Problem Relation Age of Onset  . Alcohol abuse Neg Hx   . Arthritis Neg Hx   . Asthma Neg Hx   . Birth defects Neg Hx   . Cancer Neg Hx   . COPD Neg Hx   . Depression Neg Hx   . Diabetes Neg Hx   . Drug abuse Neg Hx   . Early death Neg Hx   . Hearing loss Neg Hx   . Heart disease Neg Hx   . Hyperlipidemia Neg Hx   . Hypertension Neg Hx   . Kidney disease Neg Hx   . Learning disabilities Neg Hx   . Mental illness Neg Hx   . Mental retardation Neg Hx   . Miscarriages / Stillbirths Neg Hx   . Stroke Neg Hx   . Vision  loss Neg Hx   . Varicose Veins Neg Hx     Social History Social History   Tobacco Use  . Smoking status: Never Smoker  . Smokeless tobacco: Never Used  Substance Use Topics  . Alcohol use: No    Alcohol/week: 0.0 oz  . Drug use: No     Allergies   Patient has no known allergies.   Review of Systems Review of Systems  Constitutional: Negative for fever.  HENT: Positive for tinnitus. Negative for ear discharge, ear pain and sore throat.   Eyes: Negative for visual disturbance.  Respiratory: Negative for cough and shortness of breath.   Cardiovascular: Positive for palpitations. Negative for chest pain.  Gastrointestinal: Negative for abdominal pain, nausea and vomiting.  Genitourinary: Negative for difficulty urinating.  Musculoskeletal: Positive for gait problem. Negative for back pain and neck pain.  Skin: Negative for rash.  Neurological: Positive for dizziness (feels off balance) and numbness. Negative for syncope, facial asymmetry, speech difficulty, weakness, light-headedness and headaches.     Physical Exam Updated Vital Signs BP 129/66 (BP Location: Right Arm)   Pulse 77   Temp 97.8 F (36.6 C)   Resp 16   Ht 5\' 6"  (1.676 m)   Wt 76.2 kg (168 lb)   LMP 12/09/2014 (Approximate)   SpO2 99%   BMI 27.12 kg/m   Physical Exam  Constitutional: She is oriented to person, place, and time. She appears well-developed and well-nourished. No distress.  HENT:  Head: Normocephalic and atraumatic.  Eyes: Conjunctivae and EOM are normal.  Neck: Normal range of motion.  Cardiovascular: Normal rate, regular rhythm, normal heart sounds and intact distal pulses. Exam reveals no gallop and no friction rub.  No murmur heard. Pulmonary/Chest: Effort normal and breath sounds normal. No respiratory distress. She has no wheezes. She has no rales.  Abdominal: Soft. She exhibits no distension. There is no tenderness. There is no guarding.  Musculoskeletal: She exhibits no edema  or tenderness.  Neurological: She is alert and oriented to person, place, and time. She has normal strength. No cranial nerve deficit or sensory deficit. Abnormal gait: deferred given difficulty described/fall risk. GCS eye subscore is 4. GCS verbal subscore is 5. GCS motor subscore is 6.  Skin: Skin is warm and dry. No rash noted. She is not diaphoretic. No erythema.  Nursing note and vitals reviewed.    ED Treatments / Results  Labs (all labs ordered are listed, but only abnormal results are  displayed) Labs Reviewed  URINALYSIS, ROUTINE W REFLEX MICROSCOPIC - Abnormal; Notable for the following components:      Result Value   Color, Urine STRAW (*)    Specific Gravity, Urine 1.004 (*)    Hgb urine dipstick MODERATE (*)    Squamous Epithelial / LPF 0-5 (*)    All other components within normal limits  ETHANOL  PROTIME-INR  APTT  CBC  DIFFERENTIAL  COMPREHENSIVE METABOLIC PANEL  RAPID URINE DRUG SCREEN, HOSP PERFORMED  I-STAT TROPONIN, ED  CBG MONITORING, ED    EKG  EKG Interpretation  Date/Time:  Monday March 13 2017 07:50:08 EST Ventricular Rate:  61 PR Interval:    QRS Duration: 89 QT Interval:  413 QTC Calculation: 416 R Axis:   54 Text Interpretation:  Sinus rhythm Tw aVl now inverted in comparison to prior ECG Otherwise no significant change Confirmed by Gareth Morgan [phone]) on 03/13/2017 7:53:20 AM Also confirmed by Gareth Morgan [phone]), editor Lynder Parents [phone])  on 03/13/2017 9:49:35 AM       Radiology Ct Head Wo Contrast  Result Date: 03/13/2017 CLINICAL DATA:  Left-sided numbness over the last week. EXAM: CT HEAD WITHOUT CONTRAST TECHNIQUE: Contiguous axial images were obtained from the base of the skull through the vertex without intravenous contrast. COMPARISON:  08/20/2016 FINDINGS: Brain: The brain shows a normal appearance without evidence of malformation, atrophy, old or acute small or large vessel infarction, mass lesion, hemorrhage,  hydrocephalus or extra-axial collection. Vascular: No hyperdense vessel. No evidence of atherosclerotic calcification. Skull: Normal.  No traumatic finding.  No focal bone lesion. Sinuses/Orbits: Sinuses are clear. Orbits appear normal. Mastoids are clear. Other: None significant IMPRESSION: Normal head CT. Electronically Signed   By: Nelson Chimes M.D.   On: 03/13/2017 08:20   Mr Angiogram Head Wo Contrast  Result Date: 03/13/2017 CLINICAL DATA:  Intermittent numbness involving the left arm and both feet for 1 week. EXAM: MRI HEAD WITHOUT CONTRAST MRA HEAD WITHOUT CONTRAST MRA NECK WITHOUT AND WITH CONTRAST TECHNIQUE: Multiplanar, multiecho pulse sequences of the brain and surrounding structures were obtained without intravenous contrast. Angiographic images of the Circle of Willis were obtained using MRA technique without intravenous contrast. Angiographic images of the neck were obtained using MRA technique without and with intravenous contrast. Carotid stenosis measurements (when applicable) are obtained utilizing NASCET criteria, using the distal internal carotid diameter as the denominator. CONTRAST:  2mL MULTIHANCE GADOBENATE DIMEGLUMINE 529 MG/ML IV SOLN COMPARISON:  Head CT 03/13/2017 FINDINGS: MRI HEAD FINDINGS Coronal T2 images were inadvertently not obtained. Brain: There is no evidence of acute infarct, intracranial hemorrhage, mass, midline shift, or extra-axial fluid collection. The ventricles and sulci are normal. The brain is normal in signal. Vascular: Major intracranial vascular flow voids are preserved. Skull and upper cervical spine: Unremarkable bone marrow signal. Sinuses/Orbits: Unremarkable orbits. Paranasal sinuses and mastoid air cells are clear. Other: None. MRA HEAD FINDINGS The visualized distal vertebral arteries are patent to the basilar and codominant. Right PICA is grossly patent. Left PICA is not identified. AICAs and SCAs are patent bilaterally. The basilar artery is widely  patent. There is a small right posterior communicating artery, and there may be a tiny left posterior communicating artery. PCAs are patent without evidence of significant stenosis. The internal carotid arteries are widely patent from skull base to carotid termini. A 2 mm outpouching from the right supraclinoid ICA at the posterior communicating origin likely represents an infundibulum. A more prominent outpouching at this same level on the  left measures 2.5 mm without a vessel clearly arising from it. ACAs and MCAs are patent without evidence of proximal branch occlusion or significant stenosis. MRA NECK FINDINGS Contrast timing is suboptimal on the contrast-enhanced neck MRA resulting in nonopacification of the aortic arch, common carotid arteries, and carotid bulbs. The noncontrast time-of-flight MRA demonstrates a normal variant aortic arch branching pattern with common origin of the brachiocephalic and left common carotid arteries. Within limitations of motion artifact proximally, the common carotid arteries are widely patent on the noncontrast MRA. The cervical internal carotid arteries are patent without evidence of significant stenosis. The vertebral arteries are patent and codominant with antegrade flow bilaterally. No vertebral artery stenosis is identified. IMPRESSION: 1. Negative brain MRI. 2. Patent circle of Willis without evidence of significant stenosis. 3. 2.5 mm left posterior communicating region infundibulum versus aneurysm. 4. Patent cervical carotid and vertebral arteries without evidence of significant stenosis within limitations as above. Electronically Signed   By: Logan Bores M.D.   On: 03/13/2017 15:02   Mr Angiogram Neck W Or Wo Contrast  Result Date: 03/13/2017 CLINICAL DATA:  Intermittent numbness involving the left arm and both feet for 1 week. EXAM: MRI HEAD WITHOUT CONTRAST MRA HEAD WITHOUT CONTRAST MRA NECK WITHOUT AND WITH CONTRAST TECHNIQUE: Multiplanar, multiecho pulse  sequences of the brain and surrounding structures were obtained without intravenous contrast. Angiographic images of the Circle of Willis were obtained using MRA technique without intravenous contrast. Angiographic images of the neck were obtained using MRA technique without and with intravenous contrast. Carotid stenosis measurements (when applicable) are obtained utilizing NASCET criteria, using the distal internal carotid diameter as the denominator. CONTRAST:  39mL MULTIHANCE GADOBENATE DIMEGLUMINE 529 MG/ML IV SOLN COMPARISON:  Head CT 03/13/2017 FINDINGS: MRI HEAD FINDINGS Coronal T2 images were inadvertently not obtained. Brain: There is no evidence of acute infarct, intracranial hemorrhage, mass, midline shift, or extra-axial fluid collection. The ventricles and sulci are normal. The brain is normal in signal. Vascular: Major intracranial vascular flow voids are preserved. Skull and upper cervical spine: Unremarkable bone marrow signal. Sinuses/Orbits: Unremarkable orbits. Paranasal sinuses and mastoid air cells are clear. Other: None. MRA HEAD FINDINGS The visualized distal vertebral arteries are patent to the basilar and codominant. Right PICA is grossly patent. Left PICA is not identified. AICAs and SCAs are patent bilaterally. The basilar artery is widely patent. There is a small right posterior communicating artery, and there may be a tiny left posterior communicating artery. PCAs are patent without evidence of significant stenosis. The internal carotid arteries are widely patent from skull base to carotid termini. A 2 mm outpouching from the right supraclinoid ICA at the posterior communicating origin likely represents an infundibulum. A more prominent outpouching at this same level on the left measures 2.5 mm without a vessel clearly arising from it. ACAs and MCAs are patent without evidence of proximal branch occlusion or significant stenosis. MRA NECK FINDINGS Contrast timing is suboptimal on the  contrast-enhanced neck MRA resulting in nonopacification of the aortic arch, common carotid arteries, and carotid bulbs. The noncontrast time-of-flight MRA demonstrates a normal variant aortic arch branching pattern with common origin of the brachiocephalic and left common carotid arteries. Within limitations of motion artifact proximally, the common carotid arteries are widely patent on the noncontrast MRA. The cervical internal carotid arteries are patent without evidence of significant stenosis. The vertebral arteries are patent and codominant with antegrade flow bilaterally. No vertebral artery stenosis is identified. IMPRESSION: 1. Negative brain MRI. 2. Patent circle  of Willis without evidence of significant stenosis. 3. 2.5 mm left posterior communicating region infundibulum versus aneurysm. 4. Patent cervical carotid and vertebral arteries without evidence of significant stenosis within limitations as above. Electronically Signed   By: Logan Bores M.D.   On: 03/13/2017 15:02   Mr Brain Wo Contrast  Result Date: 03/13/2017 CLINICAL DATA:  Intermittent numbness involving the left arm and both feet for 1 week. EXAM: MRI HEAD WITHOUT CONTRAST MRA HEAD WITHOUT CONTRAST MRA NECK WITHOUT AND WITH CONTRAST TECHNIQUE: Multiplanar, multiecho pulse sequences of the brain and surrounding structures were obtained without intravenous contrast. Angiographic images of the Circle of Willis were obtained using MRA technique without intravenous contrast. Angiographic images of the neck were obtained using MRA technique without and with intravenous contrast. Carotid stenosis measurements (when applicable) are obtained utilizing NASCET criteria, using the distal internal carotid diameter as the denominator. CONTRAST:  74mL MULTIHANCE GADOBENATE DIMEGLUMINE 529 MG/ML IV SOLN COMPARISON:  Head CT 03/13/2017 FINDINGS: MRI HEAD FINDINGS Coronal T2 images were inadvertently not obtained. Brain: There is no evidence of acute  infarct, intracranial hemorrhage, mass, midline shift, or extra-axial fluid collection. The ventricles and sulci are normal. The brain is normal in signal. Vascular: Major intracranial vascular flow voids are preserved. Skull and upper cervical spine: Unremarkable bone marrow signal. Sinuses/Orbits: Unremarkable orbits. Paranasal sinuses and mastoid air cells are clear. Other: None. MRA HEAD FINDINGS The visualized distal vertebral arteries are patent to the basilar and codominant. Right PICA is grossly patent. Left PICA is not identified. AICAs and SCAs are patent bilaterally. The basilar artery is widely patent. There is a small right posterior communicating artery, and there may be a tiny left posterior communicating artery. PCAs are patent without evidence of significant stenosis. The internal carotid arteries are widely patent from skull base to carotid termini. A 2 mm outpouching from the right supraclinoid ICA at the posterior communicating origin likely represents an infundibulum. A more prominent outpouching at this same level on the left measures 2.5 mm without a vessel clearly arising from it. ACAs and MCAs are patent without evidence of proximal branch occlusion or significant stenosis. MRA NECK FINDINGS Contrast timing is suboptimal on the contrast-enhanced neck MRA resulting in nonopacification of the aortic arch, common carotid arteries, and carotid bulbs. The noncontrast time-of-flight MRA demonstrates a normal variant aortic arch branching pattern with common origin of the brachiocephalic and left common carotid arteries. Within limitations of motion artifact proximally, the common carotid arteries are widely patent on the noncontrast MRA. The cervical internal carotid arteries are patent without evidence of significant stenosis. The vertebral arteries are patent and codominant with antegrade flow bilaterally. No vertebral artery stenosis is identified. IMPRESSION: 1. Negative brain MRI. 2. Patent  circle of Willis without evidence of significant stenosis. 3. 2.5 mm left posterior communicating region infundibulum versus aneurysm. 4. Patent cervical carotid and vertebral arteries without evidence of significant stenosis within limitations as above. Electronically Signed   By: Logan Bores M.D.   On: 03/13/2017 15:02    Procedures Procedures (including critical care time)  Medications Ordered in ED Medications  gadobenate dimeglumine (MULTIHANCE) injection 20 mL (15 mLs Intravenous Contrast Given 03/13/17 1406)  diphenhydrAMINE (BENADRYL) injection 25 mg (25 mg Intravenous Given 03/13/17 1437)     Initial Impression / Assessment and Plan / ED Course  I have reviewed the triage vital signs and the nursing notes.  Pertinent labs & imaging results that were available during my care of the patient were reviewed  by me and considered in my medical decision making (see chart for details).    52yo female with history of fibroids with hysterectomy presents with concern for intermittent numbness of left arm and bilateral feet for one week and sensation of being off balance.  CT head negative. Troponin negative and low suspicion for ACS.  Good pulses bilaterally, no sign of acute arterial occlusion, history not consistent with dissection.  Possible central etiology of disequilibrium, left arm numbness/tingling, consider CVA or MS.  Bilateral leg tingling may represent neuropathy.  Plan to obtain MR WWO brain to evaluate for CVA vs MS, and obtain MRA head and neck to evaluate for signs of posterior circulation occlusion or dissection.  She has had other ED presentation for tingling in the past.   MR shows no sign of CVA, MS or other intracranial abnormalities. No significant large vessel abnormalities. Has small incidental 2.31mm infundibulum vs aneurysm and provided number for NSU follow up.  Symptoms may be peripheral vertigo versus patient feeling off balance due to possible peripheral neuropathy or  other. Labs WNL.  Given waxing and waning symptoms over the past week in both LUE and bilateral LE without progression, no weakness, do not feel presentation is consistent with GBS or transverse myelitis.   Provided meclizine rx to treat peripheral vertigo, does have occasional tinnitus consider meniere's. Recommend follow up with PCP regarding tingling symptoms, consider possible outpatient neuropathy work up (I.e. Vitamin B12, RPR, consider possible neurology follow up) and also discussed NSU follow up for ?aneurysm vs infudibulum. Patient discharged in stable condition with understanding of reasons to return.     Final Clinical Impressions(s) / ED Diagnoses   Final diagnoses:  Disequilibrium  Numbness and tingling    ED Discharge Orders        Ordered    meclizine (ANTIVERT) 25 MG tablet  3 times daily PRN     03/13/17 1524       Gareth Morgan, MD 03/13/17 2243

## 2017-03-13 NOTE — ED Notes (Signed)
Spoke with MRI. They are full and this patient's MRI will not be until this afternoon.

## 2017-03-13 NOTE — ED Notes (Signed)
Pt did not want what was on her lunch tray. Pt given a sandwich, cheese stick, ginger ale, and popcicle.  Pt at the food given to her and was then given a cup of coffee upon request.

## 2017-03-13 NOTE — ED Notes (Signed)
D/c insstructions reviewed with pt. Pt denies any questions at this time.

## 2017-03-13 NOTE — ED Triage Notes (Signed)
Patient reports that she has had intermittent numbness of the left arm and bilateral feet x 1 week. Patient states, "I'm not dizzy, but I feel like I am about to tip over."

## 2017-03-29 DIAGNOSIS — Z6828 Body mass index (BMI) 28.0-28.9, adult: Secondary | ICD-10-CM | POA: Diagnosis not present

## 2017-03-29 DIAGNOSIS — R29818 Other symptoms and signs involving the nervous system: Secondary | ICD-10-CM | POA: Diagnosis not present

## 2017-03-29 DIAGNOSIS — R9089 Other abnormal findings on diagnostic imaging of central nervous system: Secondary | ICD-10-CM | POA: Diagnosis not present

## 2017-10-03 ENCOUNTER — Encounter (HOSPITAL_COMMUNITY): Payer: Self-pay | Admitting: Emergency Medicine

## 2017-10-03 ENCOUNTER — Emergency Department (HOSPITAL_COMMUNITY): Payer: Medicaid Other

## 2017-10-03 ENCOUNTER — Emergency Department (HOSPITAL_COMMUNITY)
Admission: EM | Admit: 2017-10-03 | Discharge: 2017-10-03 | Disposition: A | Discharge status: Home / Self Care | Payer: Medicaid Other | Attending: Emergency Medicine | Admitting: Emergency Medicine

## 2017-10-03 DIAGNOSIS — R079 Chest pain, unspecified: Secondary | ICD-10-CM | POA: Insufficient documentation

## 2017-10-03 DIAGNOSIS — R0789 Other chest pain: Secondary | ICD-10-CM | POA: Diagnosis not present

## 2017-10-03 LAB — CBC
HEMATOCRIT: 39.3 % (ref 36.0–46.0)
Hemoglobin: 12.6 g/dL (ref 12.0–15.0)
MCH: 30.1 pg (ref 26.0–34.0)
MCHC: 32.1 g/dL (ref 30.0–36.0)
MCV: 93.8 fL (ref 78.0–100.0)
Platelets: 187 10*3/uL (ref 150–400)
RBC: 4.19 MIL/uL (ref 3.87–5.11)
RDW: 11.7 % (ref 11.5–15.5)
WBC: 6.1 10*3/uL (ref 4.0–10.5)

## 2017-10-03 LAB — BASIC METABOLIC PANEL
Anion gap: 7 (ref 5–15)
BUN: 14 mg/dL (ref 6–20)
CHLORIDE: 106 mmol/L (ref 98–111)
CO2: 26 mmol/L (ref 22–32)
CREATININE: 1.3 mg/dL — AB (ref 0.44–1.00)
Calcium: 9.4 mg/dL (ref 8.9–10.3)
GFR calc non Af Amer: 47 mL/min — ABNORMAL LOW (ref >60)
GFR, EST AFRICAN AMERICAN: 54 mL/min — AB (ref >60)
Glucose, Bld: 99 mg/dL (ref 70–99)
POTASSIUM: 3.9 mmol/L (ref 3.5–5.1)
SODIUM: 139 mmol/L (ref 135–145)

## 2017-10-03 LAB — I-STAT BETA HCG BLOOD, ED (MC, WL, AP ONLY): I-stat hCG, quantitative: <5 m[IU]/mL (ref <5)

## 2017-10-03 LAB — I-STAT TROPONIN, ED: Troponin i, poc: 0 ng/mL (ref 0.00–0.08)

## 2017-10-03 NOTE — ED Notes (Signed)
Called for pt. Twice with no answer.

## 2017-10-03 NOTE — ED Triage Notes (Signed)
Pt states while working today she suddenly began to have substernal chest pressure  And became diaphoretics.

## 2017-10-03 NOTE — ED Provider Notes (Cosign Needed)
Patient placed in Quick Look pathway, seen and evaluated   Chief Complaint: Chest pressure  HPI:  Patient states that she felt sweaty and clamy when she got to work at [employer] today. Reports she developed central non-radiating chest pressure shortly after becoming sweaty this morning. Had associated shortness of breath which has subsided. "I don't know what it is I just cant get myself together."   ROS: Denies nausea  Physical Exam:   Gen: No distress  Neuro: Awake and Alert  Skin: Warm    Focused Exam: No diaphoresis. Heart rate normal, rhythm regular. Lungs CTA.   Initiation of care has begun. The patient has been counseled on the process, plan, and necessity for staying for the completion/evaluation, and the remainder of the medical screening examination    Glyn Ade, PA-C 10/03/17 1340

## 2017-10-04 ENCOUNTER — Emergency Department (HOSPITAL_COMMUNITY)
Admission: EM | Admit: 2017-10-04 | Discharge: 2017-10-04 | Disposition: A | Discharge status: Home / Self Care | Payer: Medicaid Other | Attending: Emergency Medicine | Admitting: Emergency Medicine

## 2017-10-04 ENCOUNTER — Encounter (HOSPITAL_COMMUNITY): Payer: Self-pay | Admitting: Emergency Medicine

## 2017-10-04 ENCOUNTER — Emergency Department (HOSPITAL_COMMUNITY): Payer: Medicaid Other

## 2017-10-04 ENCOUNTER — Other Ambulatory Visit: Payer: Self-pay

## 2017-10-04 DIAGNOSIS — R0789 Other chest pain: Secondary | ICD-10-CM | POA: Diagnosis not present

## 2017-10-04 DIAGNOSIS — R079 Chest pain, unspecified: Secondary | ICD-10-CM | POA: Diagnosis not present

## 2017-10-04 LAB — BASIC METABOLIC PANEL
Anion gap: 8 (ref 5–15)
BUN: 19 mg/dL (ref 6–20)
CO2: 27 mmol/L (ref 22–32)
CREATININE: 1.1 mg/dL — AB (ref 0.44–1.00)
Calcium: 9.3 mg/dL (ref 8.9–10.3)
Chloride: 107 mmol/L (ref 98–111)
GFR calc Af Amer: >60 mL/min (ref >60)
GFR calc non Af Amer: 58 mL/min — ABNORMAL LOW (ref >60)
Glucose, Bld: 97 mg/dL (ref 70–99)
Potassium: 3.9 mmol/L (ref 3.5–5.1)
Sodium: 142 mmol/L (ref 135–145)

## 2017-10-04 LAB — I-STAT TROPONIN, ED: Troponin i, poc: 0.02 ng/mL (ref 0.00–0.08)

## 2017-10-04 LAB — CBC
HCT: 41.7 % (ref 36.0–46.0)
Hemoglobin: 13.9 g/dL (ref 12.0–15.0)
MCH: 30.2 pg (ref 26.0–34.0)
MCHC: 33.3 g/dL (ref 30.0–36.0)
MCV: 90.5 fL (ref 78.0–100.0)
PLATELETS: 209 10*3/uL (ref 150–400)
RBC: 4.61 MIL/uL (ref 3.87–5.11)
RDW: 12.3 % (ref 11.5–15.5)
WBC: 5.3 10*3/uL (ref 4.0–10.5)

## 2017-10-04 MED ORDER — SODIUM CHLORIDE 0.9 % IV BOLUS
1000.0000 mL | Freq: Once | INTRAVENOUS | Status: AC
  Administered 2017-10-04: 1000 mL via INTRAVENOUS

## 2017-10-04 MED ORDER — PREDNISONE 20 MG PO TABS: 40.0000 mg | ORAL_TABLET | Freq: Every day | ORAL | 0 refills | Status: DC

## 2017-10-04 MED ORDER — KETOROLAC TROMETHAMINE 30 MG/ML IJ SOLN
30.0000 mg | Freq: Once | INTRAMUSCULAR | Status: AC
  Administered 2017-10-04: 30 mg via INTRAVENOUS
  Filled 2017-10-04: qty 1

## 2017-10-04 NOTE — Discharge Instructions (Signed)
As discussed, your evaluation today has been largely reassuring.  But, it is important that you monitor your condition carefully, and do not hesitate to return to the ED if you develop new, or concerning changes in your condition.  As discussed, your evaluation has demonstrated that you have some dehydration.  Does drink plenty of fluids, take the prescribed medication and be sure to follow-up with your physician.

## 2017-10-04 NOTE — ED Triage Notes (Addendum)
Pt complaint of mid chest pain described as tightness and pressure onset 0700 yesterday; pt verbalizes "water coming off of arms and legs." Some nausea yesterday. Seen at [facility] yesterday but left prior to discharge.

## 2017-10-04 NOTE — ED Provider Notes (Signed)
[facility] DEPT Provider Note   CSN: [id number] Arrival date & time: 10/04/17  0808     History   Chief Complaint Chief Complaint  Patient presents with  . Chest Pain    HPI Maria Logan is a 52 y.o. female.  HPI Patient presents with concern of chest pain. She describes a sensation as a heaviness, pressure-like feeling across the anterior thorax, with both arms involved. No new dyspnea, though she does have increasing his comfort with activity, including at work, where she [employer]. No syncope, no dyspnea at rest, no fever, no cough. Patient states that she is generally well, does not smoke, does not drink, has no history of cardiac disease. Onset was 2 days ago, and she was seen at our affiliated facility, left prior to completion of her care yesterday.  Past Medical History:  Diagnosis Date  . Fibroids   . Umbilical hernia    CT 3/66/44  . Vaginal delivery 1988, 1990, 2005    Patient Active Problem List   Diagnosis Date Noted  . Night sweats 02/05/2015  . Abscess of female pelvis 01/09/2015  . Vaginal cuff cellulitis 01/01/2015  . Change in bowel function 12/30/2014  . S/P TAH (total abdominal hysterectomy) 12/24/2014    Past Surgical History:  Procedure Laterality Date  . ABDOMINAL HYSTERECTOMY N/A 12/23/2014   Procedure: HYSTERECTOMY ABDOMINAL;  Surgeon: Woodroe Mode, MD;  Location: [facility];  Service: Gynecology;  Laterality: N/A;  Requested 12/23/14 @ 1:00p  . BILATERAL SALPINGECTOMY Bilateral 12/23/2014   Procedure: BILATERAL SALPINGECTOMY;  Surgeon: Woodroe Mode, MD;  Location: [facility];  Service: Gynecology;  Laterality: Bilateral;  . EXAMINATION UNDER ANESTHESIA N/A 06/04/2014   Procedure: EXAM UNDER ANESTHESIA;  Surgeon: Guss Bunde, MD;  Location: [facility];  Service: Gynecology;  Laterality: N/A;  . OPERATIVE ULTRASOUND N/A 06/04/2014   Procedure: OPERATIVE ULTRASOUND;  Surgeon: Guss Bunde, MD;   Location: [facility];  Service: Gynecology;  Laterality: N/A;     OB History    Gravida  3   Para  3   Term  3   Preterm      AB  0   Living  3     SAB  0   TAB      Ectopic      Multiple      Live Births               Home Medications    Prior to Admission medications   Medication Sig Start Date End Date Taking? Authorizing Provider  ibuprofen (ADVIL,MOTRIN) 200 MG tablet Take 400-800 mg by mouth every 6 (six) hours as needed for mild pain.    Yes [provider]  meclizine (ANTIVERT) 25 MG tablet Take 1 tablet (25 mg total) by mouth 3 (three) times daily as needed for dizziness. Patient not taking: Reported on 10/04/2017 03/13/17   Gareth Morgan, MD    Family History Family History  Problem Relation Age of Onset  . Alcohol abuse Neg Hx   . Arthritis Neg Hx   . Asthma Neg Hx   . Birth defects Neg Hx   . Cancer Neg Hx   . COPD Neg Hx   . Depression Neg Hx   . Diabetes Neg Hx   . Drug abuse Neg Hx   . Early death Neg Hx   . Hearing loss Neg Hx   . Heart disease Neg Hx   . Hyperlipidemia Neg Hx   .  Hypertension Neg Hx   . Kidney disease Neg Hx   . Learning disabilities Neg Hx   . Mental illness Neg Hx   . Mental retardation Neg Hx   . Miscarriages / Stillbirths Neg Hx   . Stroke Neg Hx   . Vision loss Neg Hx   . Varicose Veins Neg Hx     Social History Social History   Tobacco Use  . Smoking status: Never Smoker  . Smokeless tobacco: Never Used  Substance Use Topics  . Alcohol use: No    Alcohol/week: 0.0 standard drinks  . Drug use: No     Allergies   Patient has no known allergies.   Review of Systems Review of Systems  Constitutional:       Per HPI, otherwise negative  HENT:       Per HPI, otherwise negative  Respiratory:       Per HPI, otherwise negative  Cardiovascular:       Per HPI, otherwise negative  Gastrointestinal: Negative for vomiting.  Endocrine:       Negative aside from HPI  Genitourinary:        Neg aside from HPI   Musculoskeletal:       Per HPI, otherwise negative  Skin: Negative.   Neurological: Negative for syncope.     Physical Exam Updated Vital Signs BP 124/90   Pulse 60   Temp 98.1 F (36.7 C) (Oral)   Resp 16   LMP 12/09/2014 (Approximate)   SpO2 100%   Physical Exam  Constitutional: She is oriented to person, place, and time. She appears well-developed and well-nourished. No distress.  HENT:  Head: Normocephalic and atraumatic.  Eyes: Conjunctivae and EOM are normal.  Cardiovascular: Normal rate, regular rhythm, intact distal pulses and normal pulses.  Pulmonary/Chest: Effort normal and breath sounds normal. No stridor. No respiratory distress.  Abdominal: She exhibits no distension.  Musculoskeletal: She exhibits no edema.  Neurological: She is alert and oriented to person, place, and time. No cranial nerve deficit.  Skin: Skin is warm and dry.  Psychiatric: She has a normal mood and affect.  Nursing note and vitals reviewed.    ED Treatments / Results  Labs (all labs ordered are listed, but only abnormal results are displayed) Labs Reviewed  BASIC METABOLIC PANEL - Abnormal; Notable for the following components:      Result Value   Creatinine, Ser 1.10 (*)    GFR calc non Af Amer 58 (*)    All other components within normal limits  CBC  I-STAT TROPONIN, ED    EKG EKG Interpretation  Date/Time:  Wednesday October 04 2017 08:14:58 EDT Ventricular Rate:  54 PR Interval:    QRS Duration: 92 QT Interval:  413 QTC Calculation: 392 R Axis:   54 Text Interpretation:  Sinus rhythm RSR' in V1 or V2, probably normal variant Confirmed by Carmin Muskrat [phone]) on 10/04/2017 9:08:13 AM   Radiology Dg Chest 2 View  Result Date: 10/04/2017 CLINICAL DATA:  Chest pain and dizziness EXAM: CHEST - 2 VIEW COMPARISON:  October 03, 2017 FINDINGS: There is no edema or consolidation. The heart size and pulmonary vascularity are normal. No adenopathy. No  pneumothorax. No bone lesions. IMPRESSION: No edema or consolidation. Electronically Signed   By: Lowella Grip III M.D.   On: 10/04/2017 09:54   Dg Chest 2 View  Result Date: 10/03/2017 CLINICAL DATA:  Chest pain. EXAM: CHEST - 2 VIEW COMPARISON:  Radiographs of July 04, 2016.  FINDINGS: The heart size and mediastinal contours are within normal limits. Both lungs are clear. No pneumothorax or pleural effusion is noted. The visualized skeletal structures are unremarkable. IMPRESSION: No active cardiopulmonary disease. Electronically Signed   By: Marijo Conception, M.D.   On: 10/03/2017 14:28    Procedures Procedures (including critical care time)  Medications Ordered in ED Medications  ketorolac (TORADOL) 30 MG/ML injection 30 mg (30 mg Intravenous Given 10/04/17 1004)  sodium chloride 0.9 % bolus 1,000 mL (1,000 mLs Intravenous New Bag/Given 10/04/17 1004)     Initial Impression / Assessment and Plan / ED Course  I have reviewed the triage vital signs and the nursing notes.  Pertinent labs & imaging results that were available during my care of the patient were reviewed by me and considered in my medical decision making (see chart for details).     I reviewed the EMR, including labs from yesterday - notable for elevated creatinine.  11:28 AM Patient in no distress on repeat exam awake, alert, speaking clearly. Pain is improved, vital signs remain normal. Review of yesterday's chart and today's note no appreciable troponin elevation, no ischemic findings on EKG, and with reassuring chest x-ray as well as her reproducible pain, there is low suspicion for atypical ACS. Suspicion for musculoskeletal etiology, likely compounded by the patient's relative dehydration with a creatinine of 1.3 Patient has received fluid resuscitation, Toradol, was encouraged to drink plenty of fluids, use ibuprofen, follow-up with primary care.   Final Clinical Impressions(s) / ED Diagnoses  Atypical chest  pain   Carmin Muskrat, MD 10/04/17 1129

## 2017-10-23 IMAGING — CT CT ABD-PELV W/ CM
2 of 6 series · 16 of 46 positions shown, 18 images · IV contrast (OMNIPAQUE 300)
Comparison: CT dated 01/09/2015

CLINICAL DATA: 49-year-old female with right lower quadrant an
suprapubic abdominal pain. History of pelvic abscess after
hysterectomy.

EXAM:
CT ABDOMEN AND PELVIS WITH CONTRAST
TECHNIQUE: Multidetector CT imaging of the abdomen and pelvis was performed
using the standard protocol following bolus administration of
intravenous contrast.
CONTRAST:  100mL OMNIPAQUE IOHEXOL 300 MG/ML SOLN, 50mL OMNIPAQUE
IOHEXOL 300 MG/ML SOLN

[Series 2: abd/pel with · axial · 0.67mm/px · z∈[+1068,+1453]mm · 13 of 89 slices shown, 15 images]
[im 6/89  soft-tissue]
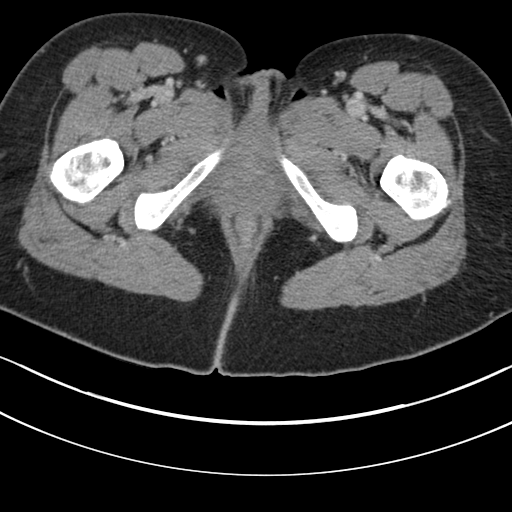
[im 6/89  bone]
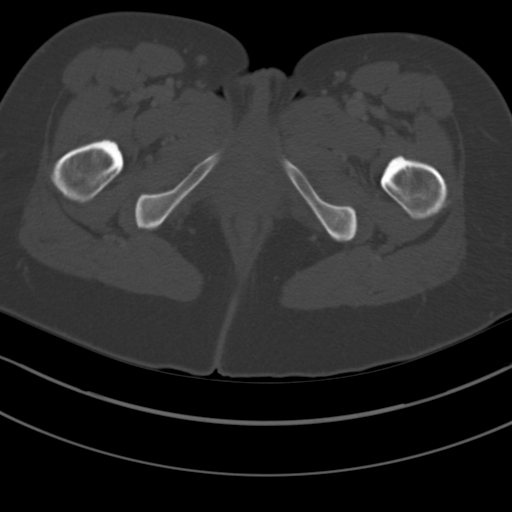
[im 12/89  soft-tissue]
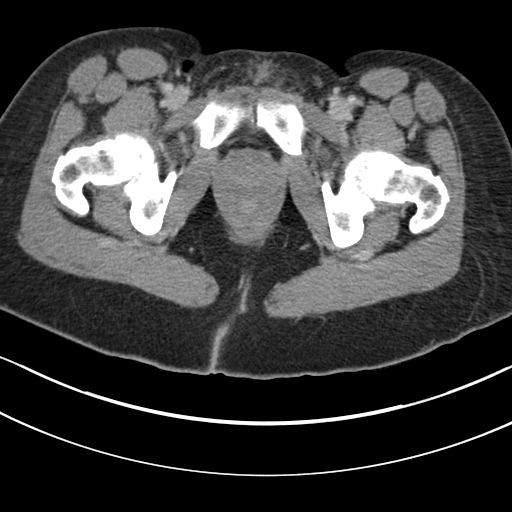
[im 17/89  soft-tissue]
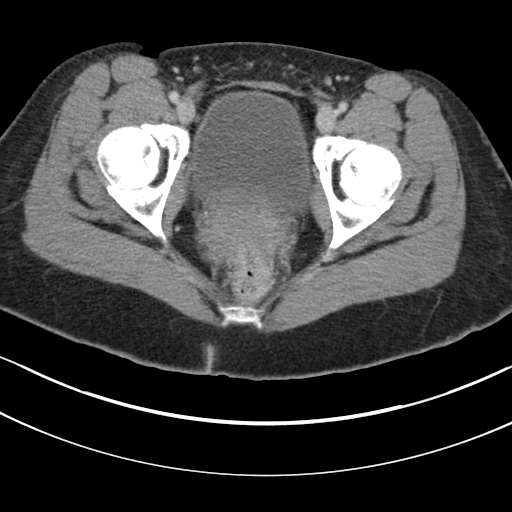
[im 28/89  soft-tissue]
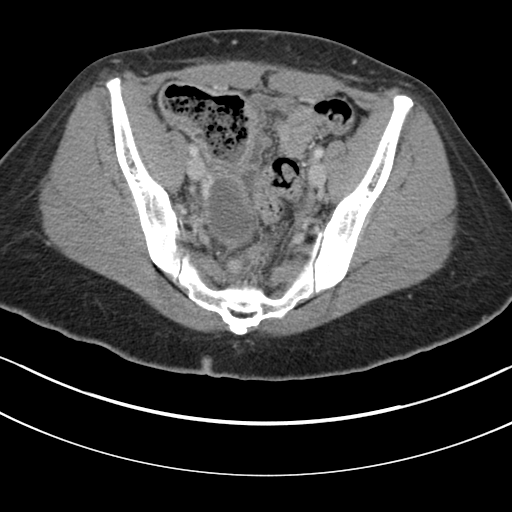
[im 34/89  soft-tissue]
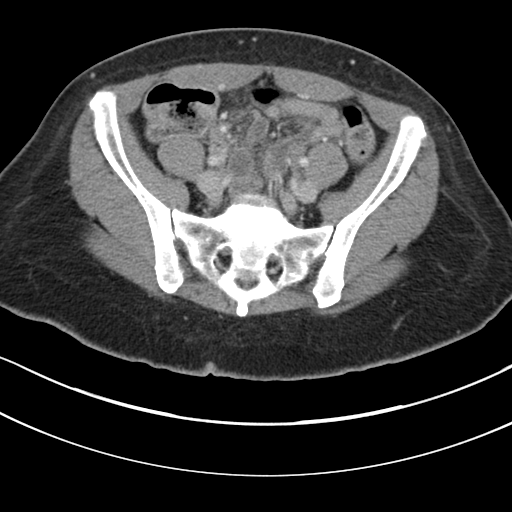
[im 39/89  soft-tissue]
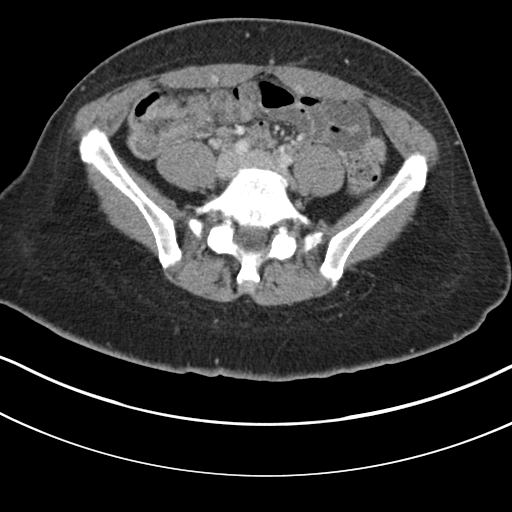
[im 45/89  soft-tissue]
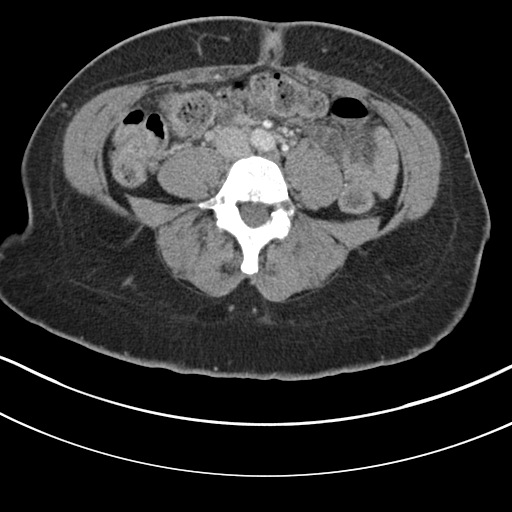
[im 50/89  soft-tissue]
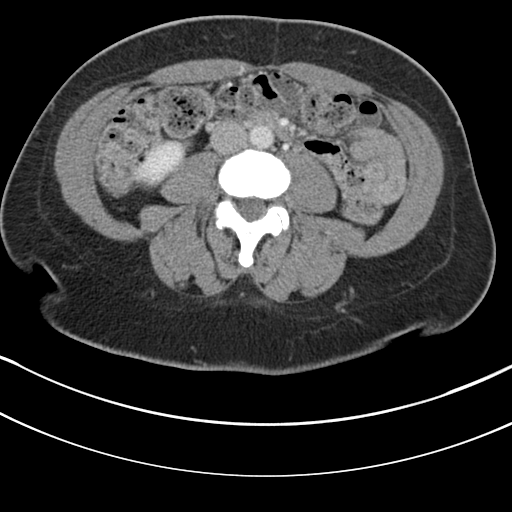
[im 56/89  soft-tissue]
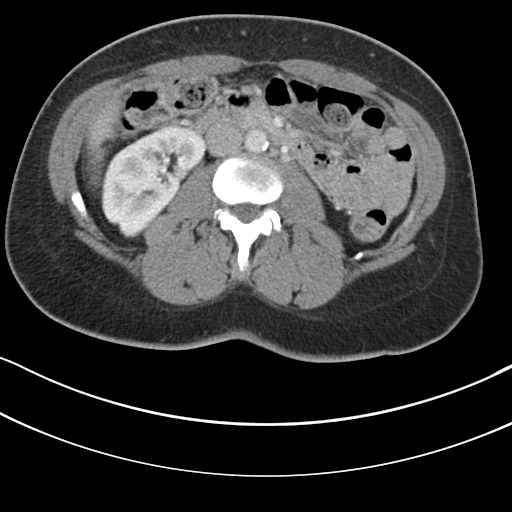
[im 56/89  bone]
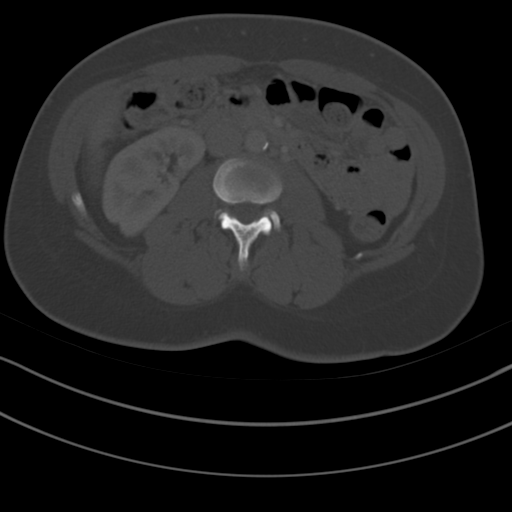
[im 61/89  soft-tissue]
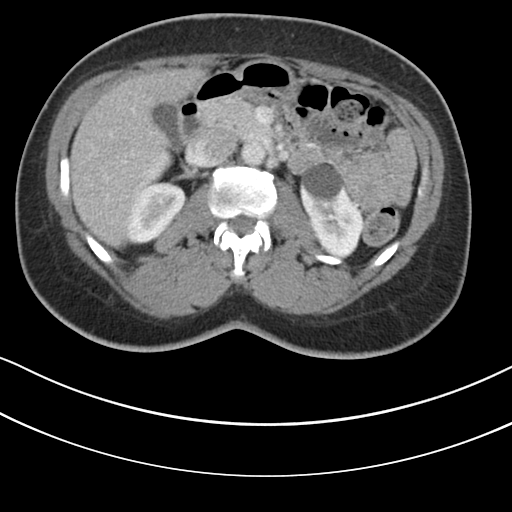
[im 72/89  soft-tissue]
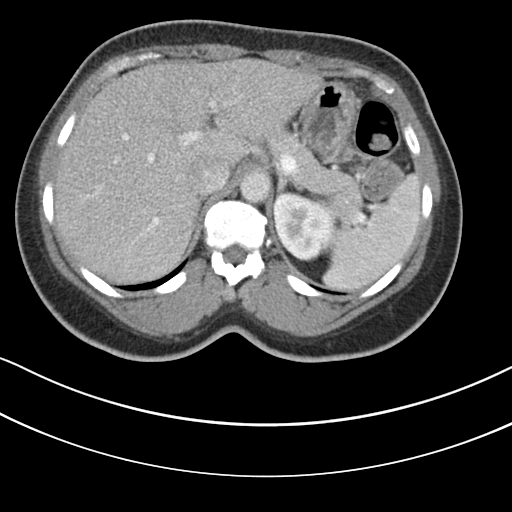
[im 78/89  soft-tissue]
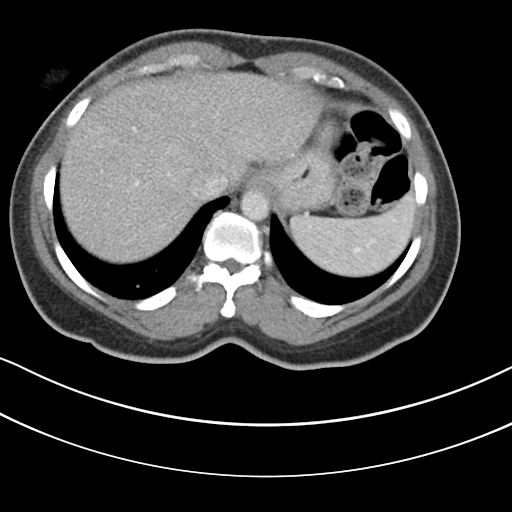
[im 83/89  soft-tissue]
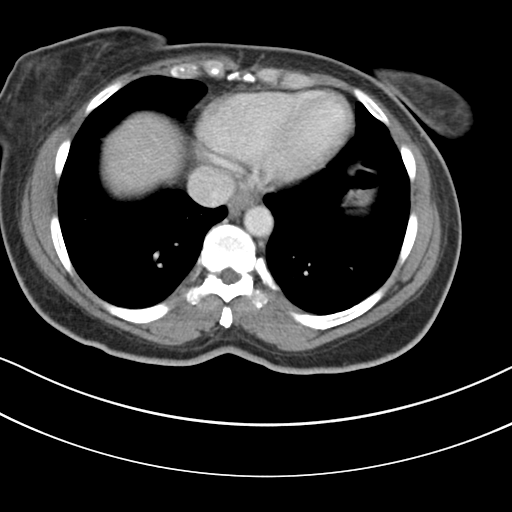

[Series 4: coronal a/|p · coronal · 0.70mm/px · 3 of 89 slices shown]
[im 30/89  soft-tissue]
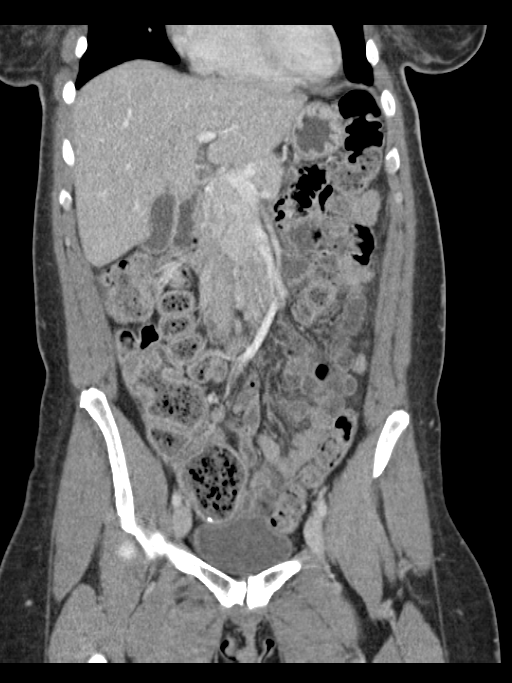
[im 40/89  soft-tissue]
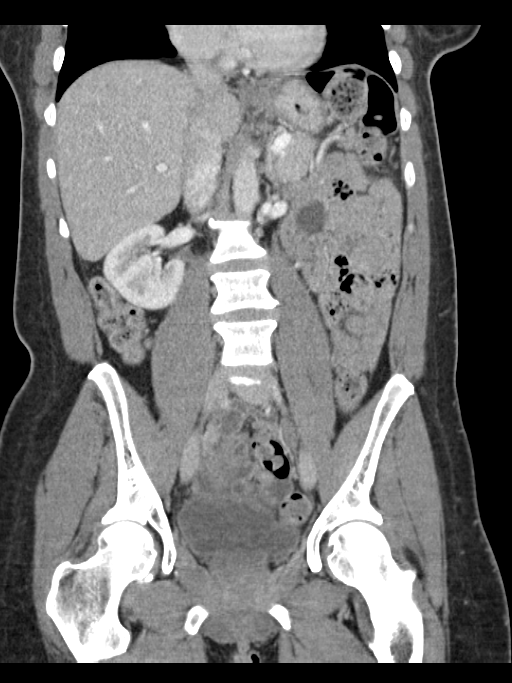
[im 49/89  soft-tissue]
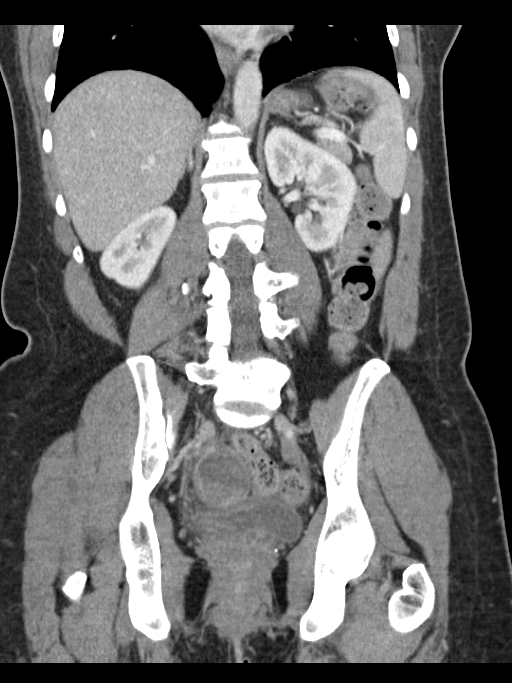

[16 of 46 positions shown; findings below may reference images not displayed]

FINDINGS: The visualized lung bases are clear. No intra-abdominal free air.
Trace free fluid within pelvis. Set

The liver, gallbladder, pancreas, spleen, and the adrenal glands
appear unremarkable. There is a 2.8 cm left renal cyst. The kidneys
are otherwise unremarkable. The visualized ureters and urinary
bladder appear unremarkable.

Hysterectomy. There is mild diffuse haziness of the pelvic floor
fat. There is a 5.2 x 3.2 cm walled-off complex fluid in the right
posterior hemipelvis in the region of the previously seen
ill-defined gas containing collection. This collection demonstrates
areas of higher attenuating content inferiorly. There is enhancement
of the walls of this collection. Likely represents an abscess. A
right ovarian hemorrhagic cyst is not excluded. Correlation with
clinical exam and pelvic ultrasound is recommended.

There is moderate stool throughout the colon. No evidence of bowel
obstruction or inflammation. Normal appendix.

The abdominal aorta and IVC appear unremarkable. No portal venous
gas identified. There is no adenopathy. The there is a small fat
containing umbilical hernia. Anterior pelvic wall surgical scar
noted. The osseous structures are intact.
IMPRESSION: A 5.2 x 3.2 cm complex fluid collection with enhancing walls in the
right posterior hemipelvis concerning for an abscess and less likely
a right ovarian hemorrhagic cyst. Correlation with clinical exam and
pelvic ultrasound is recommended.

## 2017-12-04 ENCOUNTER — Encounter: Payer: Self-pay | Admitting: Obstetrics & Gynecology

## 2017-12-04 ENCOUNTER — Other Ambulatory Visit (HOSPITAL_COMMUNITY)
Admission: RE | Admit: 2017-12-04 | Discharge: 2017-12-04 | Disposition: A | Discharge status: Home / Self Care | Payer: Medicaid Other | Source: Ambulatory Visit | Attending: Obstetrics & Gynecology | Admitting: Obstetrics & Gynecology

## 2017-12-04 ENCOUNTER — Ambulatory Visit (INDEPENDENT_AMBULATORY_CARE_PROVIDER_SITE_OTHER): Payer: Medicaid Other | Admitting: Obstetrics & Gynecology

## 2017-12-04 VITALS — BP 105/57 | HR 72 | Ht 66.0 in | Wt 181.4 lb

## 2017-12-04 DIAGNOSIS — Z01419 Encounter for gynecological examination (general) (routine) without abnormal findings: Secondary | ICD-10-CM

## 2017-12-04 DIAGNOSIS — R102 Pelvic and perineal pain: Secondary | ICD-10-CM | POA: Insufficient documentation

## 2017-12-04 DIAGNOSIS — Z0000 Encounter for general adult medical examination without abnormal findings: Secondary | ICD-10-CM | POA: Diagnosis not present

## 2017-12-04 DIAGNOSIS — Z7689 Persons encountering health services in other specified circumstances: Secondary | ICD-10-CM

## 2017-12-04 DIAGNOSIS — Z1239 Encounter for other screening for malignant neoplasm of breast: Secondary | ICD-10-CM

## 2017-12-04 DIAGNOSIS — R61 Generalized hyperhidrosis: Secondary | ICD-10-CM

## 2017-12-04 MED ORDER — ESTRADIOL 1 MG PO TABS: 1.0000 mg | ORAL_TABLET | Freq: Every day | ORAL | 6 refills | Status: DC

## 2017-12-04 NOTE — Progress Notes (Signed)
Pt states is having a lot of dryness in her Vagina & does not have any sex drive. Pt also states though out the day her hands gets numb.

## 2017-12-04 NOTE — Progress Notes (Signed)
Patient ID: Maria Logan, female   DOB: [birth year], 52 y.o.   MRN: [id number]  Chief Complaint  Patient presents with  . Gynecologic Exam    HPI CALEAH Logan is a 52 y.o. female.  [id number] She has had a TAH 2016. For the last 6 months she has had vaginal dryness, dyspareunia, and VMS, decreased libido. HPI  Past Medical History:  Diagnosis Date  . Fibroids   . Umbilical hernia    CT 09/25/92  . Vaginal delivery 1988, 1990, 2005    Past Surgical History:  Procedure Laterality Date  . ABDOMINAL HYSTERECTOMY N/A 12/23/2014   Procedure: HYSTERECTOMY ABDOMINAL;  Surgeon: Woodroe Mode, MD;  Location: [facility];  Service: Gynecology;  Laterality: N/A;  Requested 12/23/14 @ 1:00p  . BILATERAL SALPINGECTOMY Bilateral 12/23/2014   Procedure: BILATERAL SALPINGECTOMY;  Surgeon: Woodroe Mode, MD;  Location: [facility];  Service: Gynecology;  Laterality: Bilateral;  . EXAMINATION UNDER ANESTHESIA N/A 06/04/2014   Procedure: EXAM UNDER ANESTHESIA;  Surgeon: Guss Bunde, MD;  Location: [facility];  Service: Gynecology;  Laterality: N/A;  . OPERATIVE ULTRASOUND N/A 06/04/2014   Procedure: OPERATIVE ULTRASOUND;  Surgeon: Guss Bunde, MD;  Location: [facility];  Service: Gynecology;  Laterality: N/A;    Family History  Problem Relation Age of Onset  . Alcohol abuse Neg Hx   . Arthritis Neg Hx   . Asthma Neg Hx   . Birth defects Neg Hx   . Cancer Neg Hx   . COPD Neg Hx   . Depression Neg Hx   . Diabetes Neg Hx   . Drug abuse Neg Hx   . Early death Neg Hx   . Hearing loss Neg Hx   . Heart disease Neg Hx   . Hyperlipidemia Neg Hx   . Hypertension Neg Hx   . Kidney disease Neg Hx   . Learning disabilities Neg Hx   . Mental illness Neg Hx   . Mental retardation Neg Hx   . Miscarriages / Stillbirths Neg Hx   . Stroke Neg Hx   . Vision loss Neg Hx   . Varicose Veins Neg Hx     Social History Social History   Tobacco Use  . Smoking status: Never Smoker  . Smokeless tobacco: Never Used   Substance Use Topics  . Alcohol use: No    Alcohol/week: 0.0 standard drinks  . Drug use: No    No Known Allergies  Current Outpatient Medications  Medication Sig Dispense Refill  . estradiol (ESTRACE) 1 MG tablet Take 1 tablet (1 mg total) by mouth daily. 30 tablet 6  . ibuprofen (ADVIL,MOTRIN) 200 MG tablet Take 400-800 mg by mouth every 6 (six) hours as needed for mild pain.     . meclizine (ANTIVERT) 25 MG tablet Take 1 tablet (25 mg total) by mouth 3 (three) times daily as needed for dizziness. (Patient not taking: Reported on 10/04/2017) 30 tablet 0   No current facility-administered medications for this visit.     Review of Systems Review of Systems  Constitutional: Negative.   Respiratory: Negative.   Cardiovascular: Negative.   Gastrointestinal: Negative.   Endocrine:       VMS  Genitourinary: Positive for dyspareunia. Negative for vaginal bleeding and vaginal discharge.    Blood pressure (!) 105/57, pulse 72, height 5\' 6"  (1.676 m), weight 181 lb 6.4 oz (82.3 kg), last menstrual period 12/09/2014.  Physical Exam Physical Exam  Constitutional: She is oriented to person,  place, and time. She appears well-developed.  HENT:  Head: Normocephalic and atraumatic.  Eyes: Pupils are equal, round, and reactive to light.  Neck: Normal range of motion.  Cardiovascular: Normal rate, regular rhythm and normal heart sounds.  Pulmonary/Chest: Effort normal and breath sounds normal.  Abdominal: Soft. She exhibits no distension. There is no tenderness.  Genitourinary: Vagina normal. No vaginal discharge found.  Genitourinary Comments: Cuff nl, no masses  Musculoskeletal: Normal range of motion.  Neurological: She is alert and oriented to person, place, and time.  Skin: Skin is warm and dry.  Psychiatric: She has a normal mood and affect.  Vitals reviewed. Breasts: breasts appear normal, no suspicious masses, no skin or nipple changes or axillary nodes.   Data Reviewed Last  mammogram nl  Assessment    Well woman exam, menopausal VMS and vaginal dryness    Plan    Renew rx for Estrace 1 mg Mammogram ordered F/u on vaginal test RTC to review sx       Emeterio Reeve 12/04/2017, 5:17 PM

## 2017-12-04 NOTE — Patient Instructions (Signed)

## 2017-12-06 LAB — CERVICOVAGINAL ANCILLARY ONLY
BACTERIAL VAGINITIS: NEGATIVE
CANDIDA VAGINITIS: NEGATIVE
CHLAMYDIA, DNA PROBE: NEGATIVE
Neisseria Gonorrhea: NEGATIVE
Trichomonas: NEGATIVE

## 2017-12-11 ENCOUNTER — Telehealth: Payer: Self-pay

## 2017-12-11 NOTE — Telephone Encounter (Signed)
Called pt regarding Mammogram appt on 01/22/18 at 3:50pm & to arrive at 3:30p. No answer left message on VM.

## 2017-12-13 ENCOUNTER — Ambulatory Visit: Payer: Medicaid Other

## 2017-12-14 ENCOUNTER — Ambulatory Visit (INDEPENDENT_AMBULATORY_CARE_PROVIDER_SITE_OTHER): Payer: Medicaid Other | Admitting: *Deleted

## 2017-12-14 VITALS — BP 123/58 | HR 71 | Wt 183.6 lb

## 2017-12-14 DIAGNOSIS — Z23 Encounter for immunization: Secondary | ICD-10-CM

## 2017-12-14 DIAGNOSIS — Z0000 Encounter for general adult medical examination without abnormal findings: Secondary | ICD-10-CM | POA: Diagnosis not present

## 2017-12-14 NOTE — Progress Notes (Addendum)
Maria Logan here for Flu and Tdap Vaccine injection.  Injection administered without complication.  Pt reports problems with decreased sex drive.  She reports she has previously been seen regarding vaginal dryness.  Advised to schedule an appointment with a provider.  Pt verbalized understanding.    Dolores Hoose, RN 12/14/2017  5:32 PM   I have reviewed the note and agree with the plan of care.  Earlie Server, RN, MSN, NP-BC Nurse Practitioner, [facility] for [employer], [place] [facility] 12/14/2017 8:28 PM

## 2018-01-22 ENCOUNTER — Ambulatory Visit: Payer: Medicaid Other

## 2018-03-19 ENCOUNTER — Telehealth: Payer: Self-pay | Admitting: *Deleted

## 2018-03-19 ENCOUNTER — Encounter: Payer: Self-pay | Admitting: *Deleted

## 2018-03-19 NOTE — Telephone Encounter (Signed)
Maria Logan called and left a message 03/16/18 pm stating she needs to get a copy of flu shot and tb for school- wants to know if she can pick up? Per chart do see that she had flu shot and tb test administered. Do not see that she had tb test read. I called Maria Logan and left message I am returning her call and she may come by office until 7:30 to pick up what she requested.  We need to talk to her about one of her request.. Will prepare letter for her.

## 2018-03-19 NOTE — Telephone Encounter (Signed)
Maria Logan called back and left another message she missed our call. Per earlier call I called her back again and left another message she can come by office to pick up form she needs- call us if needed.

## 2018-05-10 ENCOUNTER — Other Ambulatory Visit: Payer: Self-pay

## 2018-05-10 ENCOUNTER — Encounter: Payer: Self-pay | Admitting: Family Medicine

## 2018-05-10 ENCOUNTER — Telehealth: Payer: Self-pay | Admitting: General Practice

## 2018-05-10 ENCOUNTER — Ambulatory Visit
Admission: EM | Admit: 2018-05-10 | Discharge: 2018-05-10 | Disposition: A | Discharge status: Home / Self Care | Payer: Medicaid Other | Attending: Family Medicine | Admitting: Family Medicine

## 2018-05-10 DIAGNOSIS — R3129 Other microscopic hematuria: Secondary | ICD-10-CM | POA: Diagnosis not present

## 2018-05-10 DIAGNOSIS — H8110 Benign paroxysmal vertigo, unspecified ear: Secondary | ICD-10-CM | POA: Diagnosis not present

## 2018-05-10 LAB — POCT URINALYSIS DIP (MANUAL ENTRY)
Bilirubin, UA: NEGATIVE
Glucose, UA: NEGATIVE mg/dL
Ketones, POC UA: NEGATIVE mg/dL
Leukocytes, UA: NEGATIVE
Nitrite, UA: NEGATIVE
Protein Ur, POC: NEGATIVE mg/dL
Spec Grav, UA: 1.025 (ref 1.010–1.025)
Urobilinogen, UA: 0.2 E.U./dL
pH, UA: 7 (ref 5.0–8.0)

## 2018-05-10 LAB — POCT FASTING CBG KUC MANUAL ENTRY: POCT Glucose (KUC): 109 mg/dL — AB (ref 70–99)

## 2018-05-10 MED ORDER — MECLIZINE HCL 12.5 MG PO TABS: 12.5000 mg | ORAL_TABLET | Freq: Three times a day (TID) | ORAL | 0 refills | Status: DC | PRN

## 2018-05-10 NOTE — ED Triage Notes (Signed)
Pt states for the past 2.5 wks having blurred vision, confusion at times, urinating a lot more to normal. States going through menopause and took a estrace today.

## 2018-05-10 NOTE — Telephone Encounter (Signed)
Patient called and left message on nurse voicemail line stating she has felt unbalanced and confused for the past couple of weeks now and is taking estrace. Patient requests call back.  Called patient and she reports feeling very unbalanced, feeling confused and having blurry vision for a couple weeks now. Patient states she has also had more hot flashes and wonders if she needs a higher dose of estrace. Per Dr Roselie Awkward, should try to see if we can get patient PCP appt. Called [facility] and [facility] at [facility] and they both are not scheduling new patients until June. Called patient back and [facility]. Told patient it could be related to her history of vertigo or something else but it does not appear to be GYN related. Patient verbalized understanding & had no questions.

## 2018-05-10 NOTE — ED Provider Notes (Signed)
[facility]    CSN: [id number] Arrival date & time: 05/10/18  1323     History   Chief Complaint Chief Complaint  Patient presents with  . Blurred Vision    HPI Maria Logan is a 53 y.o. female.   53 yo woman with blurry vision and polyuria.  She is a new patient to this facility. 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  No head injury, ear pain, loss of hearing or tinnitus.   Patient had an episode of vertigo about a year ago and was treated with meclizine and then estradiol.  She had a TAH 2 years ago.  MRI a year ago was negative.  Note from March 2019 ED visit: 53yo female with history of fibroids with hysterectomy presents with concern for intermittent numbness of left arm and bilateral feet for one week and sensation of being off balance.  Reports she doesn't feel dizzy, but feels as if she is off balance and like she will tip over when standing.  Will get up and sometimes both feel feel cold.  Holds onto the banister and goes doewn the stairs, at some point feels like whole body shifting to the left, feeling off balance, then will sit and feels a little better.  Packing boxes at work, feels like forgetting to count.  Sometimes feels palpitations, flutter.  Has been a week, maybe a little more.  Reports it is constant throughout the day but waxing and waning.  Feels like both feet feel cold and numb, worse on the left.  Feels like in the feet is both with laying down.  Left arm numbness and tingling is constant but waxing and waning.  No change in vision, wears glasses.  No difficulty talking.  Not feeling dizzy but feeling totally off balance.  No falls.  No fevers or chest pain.  No shortness of breath, feels different when having heart palpitations.  No headache.  Occasional ear ringing.      Past Medical History:  Diagnosis Date  . Fibroids   .  Umbilical hernia    CT 5/62/13  . Vaginal delivery 1988, 1990, 2005    Patient Active Problem List   Diagnosis Date Noted  . Night sweats 02/05/2015  . Abscess of female pelvis 01/09/2015  . Vaginal cuff cellulitis 01/01/2015  . Change in bowel function 12/30/2014  . S/P TAH (total abdominal hysterectomy) 12/24/2014    Past Surgical History:  Procedure Laterality Date  . ABDOMINAL HYSTERECTOMY N/A 12/23/2014   Procedure: HYSTERECTOMY ABDOMINAL;  Surgeon: Woodroe Mode, MD;  Location: [facility];  Service: Gynecology;  Laterality: N/A;  Requested 12/23/14 @ 1:00p  . BILATERAL SALPINGECTOMY Bilateral 12/23/2014   Procedure: BILATERAL SALPINGECTOMY;  Surgeon: Woodroe Mode, MD;  Location: [facility];  Service: Gynecology;  Laterality: Bilateral;  . EXAMINATION UNDER ANESTHESIA N/A 06/04/2014   Procedure: EXAM UNDER ANESTHESIA;  Surgeon: Guss Bunde, MD;  Location: [facility];  Service: Gynecology;  Laterality: N/A;  . OPERATIVE ULTRASOUND N/A 06/04/2014   Procedure: OPERATIVE ULTRASOUND;  Surgeon: Guss Bunde, MD;  Location: [facility];  Service: Gynecology;  Laterality: N/A;    OB History    Gravida  3   Para  3   Term  3   Preterm      AB  0   Living  3     SAB  0  TAB      Ectopic      Multiple      Live Births               Home Medications    Prior to Admission medications   Medication Sig Start Date End Date Taking? Authorizing Provider  estradiol (ESTRACE) 1 MG tablet Take 1 tablet (1 mg total) by mouth daily. 12/04/17   Woodroe Mode, MD  ibuprofen (ADVIL,MOTRIN) 200 MG tablet Take 400-800 mg by mouth every 6 (six) hours as needed for mild pain.     [provider]  meclizine (ANTIVERT) 12.5 MG tablet Take 1 tablet (12.5 mg total) by mouth 3 (three) times daily as needed for dizziness. 05/10/18   Robyn Haber, MD    Family History Family History  Problem Relation Age of Onset  . Alcohol abuse Neg Hx   . Arthritis Neg Hx   . Asthma Neg  Hx   . Birth defects Neg Hx   . Cancer Neg Hx   . COPD Neg Hx   . Depression Neg Hx   . Diabetes Neg Hx   . Drug abuse Neg Hx   . Early death Neg Hx   . Hearing loss Neg Hx   . Heart disease Neg Hx   . Hyperlipidemia Neg Hx   . Hypertension Neg Hx   . Kidney disease Neg Hx   . Learning disabilities Neg Hx   . Mental illness Neg Hx   . Mental retardation Neg Hx   . Miscarriages / Stillbirths Neg Hx   . Stroke Neg Hx   . Vision loss Neg Hx   . Varicose Veins Neg Hx     Social History Social History   Tobacco Use  . Smoking status: Never Smoker  . Smokeless tobacco: Never Used  Substance Use Topics  . Alcohol use: No    Alcohol/week: 0.0 standard drinks  . Drug use: No     Allergies   Patient has no known allergies.   Review of Systems Review of Systems   Physical Exam Triage Vital Signs ED Triage Vitals  Enc Vitals Group     BP      Pulse      Resp      Temp      Temp src      SpO2      Weight      Height      Head Circumference      Peak Flow      Pain Score      Pain Loc      Pain Edu?      Excl. in [employer]?    No data found.  Updated Vital Signs BP 127/76 (BP Location: Left Arm)   Pulse 76   Temp 97.9 F (36.6 C) (Oral)   Resp 18   LMP 12/09/2014 (Approximate)   SpO2 97%    Physical Exam Vitals signs and nursing note reviewed.  Constitutional:      General: She is not in acute distress.    Appearance: Normal appearance. She is not ill-appearing or toxic-appearing.  HENT:     Head: Normocephalic.     Right Ear: Tympanic membrane normal.     Left Ear: Tympanic membrane normal.     Mouth/Throat:     Mouth: Mucous membranes are moist.  Eyes:     Extraocular Movements: Extraocular movements intact.     Conjunctiva/sclera: Conjunctivae normal.  Pupils: Pupils are equal, round, and reactive to light.  Neck:     Musculoskeletal: Normal range of motion and neck supple.  Cardiovascular:     Rate and Rhythm: Normal rate and regular  rhythm.  Pulmonary:     Effort: Pulmonary effort is normal.     Breath sounds: Normal breath sounds.  Musculoskeletal: Normal range of motion.  Skin:    General: Skin is warm and dry.  Neurological:     General: No focal deficit present.     Mental Status: She is alert and oriented to person, place, and time.     Cranial Nerves: No cranial nerve deficit.     Sensory: No sensory deficit.     Motor: No weakness.     Gait: Gait normal.  Psychiatric:        Mood and Affect: Mood normal.        Behavior: Behavior normal.        Thought Content: Thought content normal.        Judgment: Judgment normal.      UC Treatments / Results  Labs (all labs ordered are listed, but only abnormal results are displayed) Labs Reviewed  POCT URINALYSIS DIP (MANUAL ENTRY) - Abnormal; Notable for the following components:      Result Value   Blood, UA large (*)    All other components within normal limits  URINE CULTURE    EKG None  Radiology No results found.  Procedures Procedures (including critical care time)  Medications Ordered in UC Medications - No data to display  Initial Impression / Assessment and Plan / UC Course  I have reviewed the triage vital signs and the nursing notes.  Pertinent labs & imaging results that were available during my care of the patient were reviewed by me and considered in my medical decision making (see chart for details).  With all the normal lab results a year ago and nothing focaL today, I feel this is most likely BPV.   Final Clinical Impressions(s) / UC Diagnoses   Final diagnoses:  Benign paroxysmal positional vertigo, unspecified laterality  Other microscopic hematuria   Discharge Instructions   None    ED Prescriptions    Medication Sig Dispense Auth. Provider   meclizine (ANTIVERT) 12.5 MG tablet Take 1 tablet (12.5 mg total) by mouth 3 (three) times daily as needed for dizziness. 30 tablet Robyn Haber, MD     Controlled  Substance Prescriptions [place] Controlled Substance Registry consulted? Not Applicable   Robyn Haber, MD 05/10/18 1407

## 2018-05-11 LAB — URINE CULTURE

## 2018-05-17 ENCOUNTER — Telehealth: Payer: Self-pay | Admitting: Emergency Medicine

## 2018-05-17 NOTE — Telephone Encounter (Signed)
Patient called wanting to follow up on her visit from last week.  States dizziness is continuing, no relief with prescription.  Dr. Carlean Jews reviewed results, patient updated, and then patient encouraged to follow up with neurologist provided on AVS.  Patient given name and number again.  Pt verbalized understanding.

## 2018-05-22 DIAGNOSIS — Z131 Encounter for screening for diabetes mellitus: Secondary | ICD-10-CM | POA: Diagnosis not present

## 2018-05-22 DIAGNOSIS — R42 Dizziness and giddiness: Secondary | ICD-10-CM | POA: Diagnosis not present

## 2018-05-22 DIAGNOSIS — N390 Urinary tract infection, site not specified: Secondary | ICD-10-CM | POA: Diagnosis not present

## 2018-05-22 DIAGNOSIS — Z13220 Encounter for screening for lipoid disorders: Secondary | ICD-10-CM | POA: Diagnosis not present

## 2018-05-23 DIAGNOSIS — Z13220 Encounter for screening for lipoid disorders: Secondary | ICD-10-CM | POA: Diagnosis not present

## 2018-05-23 DIAGNOSIS — Z131 Encounter for screening for diabetes mellitus: Secondary | ICD-10-CM | POA: Diagnosis not present

## 2018-05-23 DIAGNOSIS — R42 Dizziness and giddiness: Secondary | ICD-10-CM | POA: Diagnosis not present

## 2018-05-23 DIAGNOSIS — N390 Urinary tract infection, site not specified: Secondary | ICD-10-CM | POA: Diagnosis not present

## 2018-05-29 DIAGNOSIS — F070 Personality change due to known physiological condition: Secondary | ICD-10-CM | POA: Diagnosis not present

## 2018-05-29 DIAGNOSIS — N390 Urinary tract infection, site not specified: Secondary | ICD-10-CM | POA: Diagnosis not present

## 2018-06-14 DIAGNOSIS — R42 Dizziness and giddiness: Secondary | ICD-10-CM | POA: Diagnosis not present

## 2018-06-28 DIAGNOSIS — R42 Dizziness and giddiness: Secondary | ICD-10-CM | POA: Diagnosis not present

## 2018-06-28 DIAGNOSIS — R200 Anesthesia of skin: Secondary | ICD-10-CM | POA: Diagnosis not present

## 2018-07-03 DIAGNOSIS — M179 Osteoarthritis of knee, unspecified: Secondary | ICD-10-CM | POA: Diagnosis not present

## 2018-07-03 DIAGNOSIS — M79609 Pain in unspecified limb: Secondary | ICD-10-CM | POA: Diagnosis not present

## 2018-07-04 ENCOUNTER — Telehealth (HOSPITAL_COMMUNITY): Payer: Self-pay | Admitting: *Deleted

## 2018-07-04 ENCOUNTER — Other Ambulatory Visit (HOSPITAL_COMMUNITY): Payer: Self-pay | Admitting: Internal Medicine

## 2018-07-04 ENCOUNTER — Other Ambulatory Visit: Payer: Self-pay

## 2018-07-04 ENCOUNTER — Telehealth (HOSPITAL_COMMUNITY): Payer: Self-pay

## 2018-07-04 ENCOUNTER — Ambulatory Visit (HOSPITAL_COMMUNITY)
Admission: RE | Admit: 2018-07-04 | Discharge: 2018-07-04 | Disposition: A | Discharge status: Home / Self Care | Payer: Medicaid Other | Source: Ambulatory Visit | Attending: Family | Admitting: Family

## 2018-07-04 DIAGNOSIS — M79605 Pain in left leg: Secondary | ICD-10-CM | POA: Diagnosis not present

## 2018-07-04 NOTE — Telephone Encounter (Signed)
Attempted to contact ordering provider to deliver preliminary results of negative left lower extremity DVT study at 2:24pm. Office closed at 2:00 pm; voicemail was left for provider.

## 2018-07-04 NOTE — Telephone Encounter (Signed)
The above patient or their representative was contacted and gave the following answers to these questions:         Do you have any of the following symptoms?  no  Fever                    Cough                   Shortness of breath  Do  you have any of the following other symptoms? no   muscle pain         vomiting,        diarrhea        rash         weakness        red eye        abdominal pain         bruising          bruising or bleeding              joint pain           severe headache    Have you been in contact with someone who was or has been sick in the past 2 weeks?  no  Yes                 Unsure                         Unable to assess   Does the person that you were in contact with have any of the following symptoms?   Cough         shortness of breath           muscle pain         vomiting,            diarrhea            rash            weakness           fever            red eye           abdominal pain           bruising  or  bleeding                joint pain                severe headache               Have you  or someone you have been in contact with traveled internationally in th last month?   no      If yes, which countries?   Have you  or someone you have been in contact with traveled outside [place] in th last month?   no      If yes, which state and city?   COMMENTS OR ACTION PLAN FOR THIS PATIENT:          

## 2018-07-06 ENCOUNTER — Encounter (HOSPITAL_COMMUNITY): Payer: Self-pay | Admitting: Emergency Medicine

## 2018-07-06 ENCOUNTER — Other Ambulatory Visit: Payer: Self-pay

## 2018-07-06 ENCOUNTER — Emergency Department (HOSPITAL_COMMUNITY)
Admission: EM | Admit: 2018-07-06 | Discharge: 2018-07-06 | Disposition: A | Discharge status: Home / Self Care | Payer: Medicaid Other | Attending: Emergency Medicine | Admitting: Emergency Medicine

## 2018-07-06 DIAGNOSIS — Z20822 Contact with and (suspected) exposure to covid-19: Secondary | ICD-10-CM

## 2018-07-06 DIAGNOSIS — Z79899 Other long term (current) drug therapy: Secondary | ICD-10-CM | POA: Insufficient documentation

## 2018-07-06 DIAGNOSIS — Z20828 Contact with and (suspected) exposure to other viral communicable diseases: Secondary | ICD-10-CM | POA: Diagnosis not present

## 2018-07-06 DIAGNOSIS — Z03818 Encounter for observation for suspected exposure to other biological agents ruled out: Secondary | ICD-10-CM | POA: Diagnosis not present

## 2018-07-06 NOTE — Discharge Instructions (Addendum)
Continue standard precautions of wearing a mask and washing your hands frequently. If your COVID test is positive, you will likely need to remain out of work until you have two tests that return negative. If your test today is negative, you do not need any additional testing unless you should develop symptoms such as fever, loss of smell or taste, cough, shortness of breath, body aches.

## 2018-07-06 NOTE — ED Triage Notes (Signed)
Pt co-workers tested positive. Pt wants to be tested.

## 2018-07-06 NOTE — ED Notes (Signed)
Patient verbalizes understanding of discharge instructions. Opportunity for questioning and answers were provided. Armband removed by staff, pt discharged from ED ambulatory.   

## 2018-07-06 NOTE — ED Provider Notes (Signed)
[facility] Provider Note   CSN: [id number] Arrival date & time: 07/06/18  2423     History   Chief Complaint Chief Complaint  Patient presents with  . Wants Covid Test    HPI Maria Logan is a 53 y.o. female.     53 year old female presents to the emergency department requesting a COVID test.  She states that there were 2 coworkers at her job that tested positive for coronavirus.  She has no symptoms.  Denies fever, cough, shortness of breath.  Is simply obtaining a test as a precaution.  No additional complaints for this visit.  The history is provided by the patient. No language interpreter was used.    Past Medical History:  Diagnosis Date  . Fibroids   . Umbilical hernia    CT 5/36/14  . Vaginal delivery 1988, 1990, 2005    Patient Active Problem List   Diagnosis Date Noted  . Night sweats 02/05/2015  . Abscess of female pelvis 01/09/2015  . Vaginal cuff cellulitis 01/01/2015  . Change in bowel function 12/30/2014  . S/P TAH (total abdominal hysterectomy) 12/24/2014    Past Surgical History:  Procedure Laterality Date  . ABDOMINAL HYSTERECTOMY N/A 12/23/2014   Procedure: HYSTERECTOMY ABDOMINAL;  Surgeon: Woodroe Mode, MD;  Location: [facility];  Service: Gynecology;  Laterality: N/A;  Requested 12/23/14 @ 1:00p  . BILATERAL SALPINGECTOMY Bilateral 12/23/2014   Procedure: BILATERAL SALPINGECTOMY;  Surgeon: Woodroe Mode, MD;  Location: [facility];  Service: Gynecology;  Laterality: Bilateral;  . EXAMINATION UNDER ANESTHESIA N/A 06/04/2014   Procedure: EXAM UNDER ANESTHESIA;  Surgeon: Guss Bunde, MD;  Location: [facility];  Service: Gynecology;  Laterality: N/A;  . OPERATIVE ULTRASOUND N/A 06/04/2014   Procedure: OPERATIVE ULTRASOUND;  Surgeon: Guss Bunde, MD;  Location: [facility];  Service: Gynecology;  Laterality: N/A;     OB History    Gravida  3   Para  3   Term  3   Preterm      AB  0   Living  3     SAB   0   TAB      Ectopic      Multiple      Live Births               Home Medications    Prior to Admission medications   Medication Sig Start Date End Date Taking? Authorizing Provider  estradiol (ESTRACE) 1 MG tablet Take 1 tablet (1 mg total) by mouth daily. 12/04/17   Woodroe Mode, MD  ibuprofen (ADVIL,MOTRIN) 200 MG tablet Take 400-800 mg by mouth every 6 (six) hours as needed for mild pain.     [provider]  meclizine (ANTIVERT) 12.5 MG tablet Take 1 tablet (12.5 mg total) by mouth 3 (three) times daily as needed for dizziness. 05/10/18   Robyn Haber, MD    Family History Family History  Problem Relation Age of Onset  . Alcohol abuse Neg Hx   . Arthritis Neg Hx   . Asthma Neg Hx   . Birth defects Neg Hx   . Cancer Neg Hx   . COPD Neg Hx   . Depression Neg Hx   . Diabetes Neg Hx   . Drug abuse Neg Hx   . Early death Neg Hx   . Hearing loss Neg Hx   . Heart disease Neg Hx   . Hyperlipidemia Neg Hx   .  Hypertension Neg Hx   . Kidney disease Neg Hx   . Learning disabilities Neg Hx   . Mental illness Neg Hx   . Mental retardation Neg Hx   . Miscarriages / Stillbirths Neg Hx   . Stroke Neg Hx   . Vision loss Neg Hx   . Varicose Veins Neg Hx     Social History Social History   Tobacco Use  . Smoking status: Never Smoker  . Smokeless tobacco: Never Used  Substance Use Topics  . Alcohol use: No    Alcohol/week: 0.0 standard drinks  . Drug use: No     Allergies   Patient has no known allergies.   Review of Systems Review of Systems Ten systems reviewed and are negative for acute change, except as noted in the HPI.    Physical Exam Updated Vital Signs BP (!) 121/95 (BP Location: Right Arm)   Pulse 81   Temp 97.6 F (36.4 C) (Oral)   Resp 17   Ht 5\' 6"  (1.676 m)   Wt 68 kg   LMP 12/09/2014 (Approximate)   SpO2 99%   BMI 24.21 kg/m   Physical Exam Vitals signs and nursing note reviewed.  Constitutional:      General:  She is not in acute distress.    Appearance: She is well-developed. She is not diaphoretic.  HENT:     Head: Normocephalic and atraumatic.  Eyes:     General: No scleral icterus.    Conjunctiva/sclera: Conjunctivae normal.  Neck:     Musculoskeletal: Normal range of motion.  Pulmonary:     Effort: Pulmonary effort is normal. No respiratory distress.  Musculoskeletal: Normal range of motion.  Skin:    General: Skin is warm and dry.     Coloration: Skin is not pale.     Findings: No erythema or rash.  Neurological:     Mental Status: She is alert and oriented to person, place, and time.  Psychiatric:        Behavior: Behavior normal.      ED Treatments / Results  Labs (all labs ordered are listed, but only abnormal results are displayed) Labs Reviewed  NOVEL CORONAVIRUS, NAA (HOSPITAL ORDER, SEND-OUT TO REF LAB)    EKG    Radiology Vas [place] Lower Extremity Venous (dvt)  Result Date: 07/04/2018  Lower Venous Study Indications: Pain.  Performing Technologist: Caralee Ates BA, RVT, RDMS  Examination Guidelines: A complete evaluation includes B-mode imaging, spectral Doppler, color Doppler, and power Doppler as needed of all accessible portions of each vessel. Bilateral testing is considered an integral part of a complete examination. Limited examinations for reoccurring indications may be performed as noted.  +-----+---------------+---------+-----------+----------+-------+ RIGHTCompressibilityPhasicitySpontaneityPropertiesSummary +-----+---------------+---------+-----------+----------+-------+ CFV  Full           Yes      Yes                          +-----+---------------+---------+-----------+----------+-------+ SFJ  Full           Yes      Yes                          +-----+---------------+---------+-----------+----------+-------+   +---------+---------------+---------+-----------+----------+---------+ LEFT      CompressibilityPhasicitySpontaneityPropertiesSummary   +---------+---------------+---------+-----------+----------+---------+ CFV      Full           Yes      Yes                            +---------+---------------+---------+-----------+----------+---------+  SFJ      Full           Yes      Yes                            +---------+---------------+---------+-----------+----------+---------+ FV Prox  Full           Yes      Yes                            +---------+---------------+---------+-----------+----------+---------+ FV Mid   Full           Yes      Yes                            +---------+---------------+---------+-----------+----------+---------+ FV DistalFull           Yes      Yes                            +---------+---------------+---------+-----------+----------+---------+ PFV      Full           Yes      Yes                            +---------+---------------+---------+-----------+----------+---------+ POP      Full           Yes      Yes                            +---------+---------------+---------+-----------+----------+---------+ PTV      Full           Yes      Yes                            +---------+---------------+---------+-----------+----------+---------+ PERO     Full           Yes      Yes                            +---------+---------------+---------+-----------+----------+---------+ Soleal   Full           Yes      Yes                  At origin +---------+---------------+---------+-----------+----------+---------+ Gastroc  Full           Yes      Yes                  At origin +---------+---------------+---------+-----------+----------+---------+ GSV      Full           Yes      Yes                  At origin +---------+---------------+---------+-----------+----------+---------+ SSV      Full           Yes      Yes                  At origin  +---------+---------------+---------+-----------+----------+---------+     Summary: Right: No evidence of common femoral vein obstruction. Left: There is no evidence of deep vein thrombosis in the lower  extremity. Attempted to reach ordering provider 2:26 pm. Unable to delivery preliminary results due to office being closed.  *See table(s) above for measurements and observations. Electronically signed by Deitra Mayo MD on 07/04/2018 at 2:52:52 PM.    Final     Procedures Procedures (including critical care time)  Medications Ordered in ED Medications - No data to display   Initial Impression / Assessment and Plan / ED Course  I have reviewed the triage vital signs and the nursing notes.  Pertinent labs & imaging results that were available during my care of the patient were reviewed by me and considered in my medical decision making (see chart for details).        53 year old female presents to the ED for a coronavirus test.  Reports to occupational exposures, though she has no symptoms.  Afebrile without signs of respiratory distress.  No hypoxia.  A send out test was completed.  Patient advised to follow-up on these results.  Return precautions discussed and provided. Patient discharged in stable condition with no unaddressed concerns.  Maria Logan was evaluated in Emergency Department on 07/06/2018 for the symptoms described in the history of present illness. She was evaluated in the context of the global COVID-19 pandemic, which necessitated consideration that the patient might be at risk for infection with the SARS-CoV-2 virus that causes COVID-19. Institutional protocols and algorithms that pertain to the evaluation of patients at risk for COVID-19 are in a state of rapid change based on information released by regulatory bodies including the CDC and federal and state organizations. These policies and algorithms were followed during the patient's care in the ED.   Final  Clinical Impressions(s) / ED Diagnoses   Final diagnoses:  Exposure to [place] Virus    ED Discharge Orders    None       Antonietta Breach, PA-C 07/06/18 3007    Ripley Fraise, MD 07/06/18 [phone]

## 2018-07-07 LAB — NOVEL CORONAVIRUS, NAA (HOSP ORDER, SEND-OUT TO REF LAB; TAT 18-24 HRS): SARS-CoV-2, NAA: NOT DETECTED

## 2018-07-11 DIAGNOSIS — I83813 Varicose veins of bilateral lower extremities with pain: Secondary | ICD-10-CM | POA: Diagnosis not present

## 2018-07-11 DIAGNOSIS — M179 Osteoarthritis of knee, unspecified: Secondary | ICD-10-CM | POA: Diagnosis not present

## 2018-07-11 DIAGNOSIS — R42 Dizziness and giddiness: Secondary | ICD-10-CM | POA: Diagnosis not present

## 2018-07-31 DIAGNOSIS — M179 Osteoarthritis of knee, unspecified: Secondary | ICD-10-CM | POA: Diagnosis not present

## 2018-07-31 DIAGNOSIS — R42 Dizziness and giddiness: Secondary | ICD-10-CM | POA: Diagnosis not present

## 2018-08-18 ENCOUNTER — Other Ambulatory Visit: Payer: Self-pay

## 2018-08-18 ENCOUNTER — Ambulatory Visit (HOSPITAL_COMMUNITY)
Admission: EM | Admit: 2018-08-18 | Discharge: 2018-08-18 | Disposition: A | Discharge status: Home / Self Care | Payer: Medicaid Other | Attending: Family Medicine | Admitting: Family Medicine

## 2018-08-18 ENCOUNTER — Encounter (HOSPITAL_COMMUNITY): Payer: Self-pay | Admitting: *Deleted

## 2018-08-18 DIAGNOSIS — M76892 Other specified enthesopathies of left lower limb, excluding foot: Secondary | ICD-10-CM | POA: Diagnosis not present

## 2018-08-18 MED ORDER — TRIAMTERENE-HCTZ 37.5-25 MG PO TABS: 1.0000 | ORAL_TABLET | Freq: Every day | ORAL | 1 refills | Status: DC

## 2018-08-18 MED ORDER — PREDNISONE 20 MG PO TABS: ORAL_TABLET | ORAL | 0 refills | Status: DC

## 2018-08-18 NOTE — ED Provider Notes (Signed)
[place]    CSN: [id number] Arrival date & time: 08/18/18  1705     History   Chief Complaint Chief Complaint  Patient presents with  . Knee Pain    HPI Maria Logan is a 53 y.o. female.   Initial [facility] visit  C/O left knee pain x "months"; has been f/u with PCP - had cortisone shot.  Since injection approx 3 wks ago, c/o significant fluid in left knee.  Also c/o "popping" in left knee.  She works in a [employer].  She has been furloughed much of the time lately.  Nevertheless she has had progressive left leg swelling and knee pain with popping and clicking whenever she bends her extends her knee.  She has been wearing compression stockings to reduce the swelling in her lower leg.  She is had no calf pain there has had medial knee soreness.     Past Medical History:  Diagnosis Date  . Fibroids   . Umbilical hernia    CT 8/75/64  . Vaginal delivery 1988, 1990, 2005    Patient Active Problem List   Diagnosis Date Noted  . Night sweats 02/05/2015  . Abscess of female pelvis 01/09/2015  . Vaginal cuff cellulitis 01/01/2015  . Change in bowel function 12/30/2014  . S/P TAH (total abdominal hysterectomy) 12/24/2014    Past Surgical History:  Procedure Laterality Date  . ABDOMINAL HYSTERECTOMY N/A 12/23/2014   Procedure: HYSTERECTOMY ABDOMINAL;  Surgeon: Woodroe Mode, MD;  Location: [facility];  Service: Gynecology;  Laterality: N/A;  Requested 12/23/14 @ 1:00p  . BILATERAL SALPINGECTOMY Bilateral 12/23/2014   Procedure: BILATERAL SALPINGECTOMY;  Surgeon: Woodroe Mode, MD;  Location: [facility];  Service: Gynecology;  Laterality: Bilateral;  . EXAMINATION UNDER ANESTHESIA N/A 06/04/2014   Procedure: EXAM UNDER ANESTHESIA;  Surgeon: Guss Bunde, MD;  Location: [facility];  Service: Gynecology;  Laterality: N/A;  . OPERATIVE ULTRASOUND N/A 06/04/2014   Procedure: OPERATIVE ULTRASOUND;  Surgeon: Guss Bunde, MD;  Location: [facility];  Service: Gynecology;   Laterality: N/A;    OB History    Gravida  3   Para  3   Term  3   Preterm      AB  0   Living  3     SAB  0   TAB      Ectopic      Multiple      Live Births               Home Medications    Prior to Admission medications   Medication Sig Start Date End Date Taking? Authorizing Provider  UNKNOWN TO PATIENT ?NSAID Rx per PCP   Yes [provider]  estradiol (ESTRACE) 1 MG tablet Take 1 tablet (1 mg total) by mouth daily. 12/04/17   Woodroe Mode, MD  ibuprofen (ADVIL,MOTRIN) 200 MG tablet Take 400-800 mg by mouth every 6 (six) hours as needed for mild pain.     [provider]  meclizine (ANTIVERT) 12.5 MG tablet Take 1 tablet (12.5 mg total) by mouth 3 (three) times daily as needed for dizziness. 05/10/18   Robyn Haber, MD  predniSONE (DELTASONE) 20 MG tablet Two daily with food 08/18/18   Robyn Haber, MD  triamterene-hydrochlorothiazide (MAXZIDE-25) 37.5-25 MG tablet Take 1 tablet by mouth daily. 08/18/18   Robyn Haber, MD    Family History Family History  Problem Relation Age of Onset  . Diabetes Mother   .  Alcohol abuse Neg Hx   . Arthritis Neg Hx   . Asthma Neg Hx   . Birth defects Neg Hx   . Cancer Neg Hx   . COPD Neg Hx   . Depression Neg Hx   . Drug abuse Neg Hx   . Early death Neg Hx   . Hearing loss Neg Hx   . Heart disease Neg Hx   . Hyperlipidemia Neg Hx   . Hypertension Neg Hx   . Kidney disease Neg Hx   . Learning disabilities Neg Hx   . Mental illness Neg Hx   . Mental retardation Neg Hx   . Miscarriages / Stillbirths Neg Hx   . Stroke Neg Hx   . Vision loss Neg Hx   . Varicose Veins Neg Hx     Social History Social History   Tobacco Use  . Smoking status: Never Smoker  . Smokeless tobacco: Never Used  Substance Use Topics  . Alcohol use: No  . Drug use: No     Allergies   Patient has no known allergies.   Review of Systems Review of Systems  Musculoskeletal: Positive for gait  problem and joint swelling.  All other systems reviewed and are negative.    Physical Exam Triage Vital Signs ED Triage Vitals [08/18/18 1750]  Enc Vitals Group     BP 106/67     Pulse Rate 76     Resp 16     Temp 98.3 F (36.8 C)     Temp Source Oral     SpO2 96 %     Weight      Height      Head Circumference      Peak Flow      Pain Score 8     Pain Loc      Pain Edu?      Excl. in [place]?    No data found.  Updated Vital Signs BP 106/67   Pulse 76   Temp 98.3 F (36.8 C) (Oral)   Resp 16   LMP 12/09/2014 (Approximate)   SpO2 96%    Physical Exam Vitals signs and nursing note reviewed.  Constitutional:      Appearance: Normal appearance.  HENT:     Head: Normocephalic.     Mouth/Throat:     Mouth: Mucous membranes are moist.  Eyes:     Conjunctiva/sclera: Conjunctivae normal.  Neck:     Musculoskeletal: Normal range of motion and neck supple.  Pulmonary:     Effort: Pulmonary effort is normal.  Musculoskeletal:        General: Swelling present. No tenderness, deformity or signs of injury.     Left lower leg: Edema present.     Comments: Mild diffuse swelling in the medial side of the left knee and some mild pedal edema.  There is no calf tenderness.  Skin:    General: Skin is warm and dry.  Neurological:     General: No focal deficit present.     Mental Status: She is alert.  Psychiatric:        Mood and Affect: Mood normal.      UC Treatments / Results  Labs (all labs ordered are listed, but only abnormal results are displayed) Labs Reviewed - No data to display  EKG   Radiology No results found.  Procedures Procedures (including critical care time)  Medications Ordered in UC Medications - No data to display  Initial Impression / Assessment  and Plan / UC Course  I have reviewed the triage vital signs and the nursing notes.  Pertinent labs & imaging results that were available during my care of the patient were reviewed by me and  considered in my medical decision making (see chart for details).    Final Clinical Impressions(s) / UC Diagnoses   Final diagnoses:  Left knee tendonitis     Discharge Instructions     Follow up by calling the orthopedist on Monday    ED Prescriptions    Medication Sig Dispense Auth. Provider   predniSONE (DELTASONE) 20 MG tablet Two daily with food 10 tablet Robyn Haber, MD   triamterene-hydrochlorothiazide (MAXZIDE-25) 37.5-25 MG tablet Take 1 tablet by mouth daily. 20 tablet Robyn Haber, MD     Controlled Substance Prescriptions [place] Controlled Substance Registry consulted? Not Applicable   Robyn Haber, MD 08/18/18 [phone]

## 2018-08-18 NOTE — ED Notes (Signed)
Pt refused knee sleeve

## 2018-08-18 NOTE — Discharge Instructions (Signed)
Follow up by calling the orthopedist on Monday

## 2018-08-18 NOTE — ED Triage Notes (Signed)
C/O left knee pain x "months"; has been f/u with PCP - had cortisone shot.  Since injection approx 3 wks ago, c/o significant fluid in left knee.  Also c/o "popping" in left knee.

## 2018-08-22 DIAGNOSIS — I671 Cerebral aneurysm, nonruptured: Secondary | ICD-10-CM | POA: Diagnosis not present

## 2018-08-22 DIAGNOSIS — F419 Anxiety disorder, unspecified: Secondary | ICD-10-CM | POA: Diagnosis not present

## 2018-08-22 DIAGNOSIS — R202 Paresthesia of skin: Secondary | ICD-10-CM | POA: Diagnosis not present

## 2018-08-22 DIAGNOSIS — R42 Dizziness and giddiness: Secondary | ICD-10-CM | POA: Diagnosis not present

## 2018-09-19 DIAGNOSIS — R202 Paresthesia of skin: Secondary | ICD-10-CM | POA: Diagnosis not present

## 2018-09-28 DIAGNOSIS — R200 Anesthesia of skin: Secondary | ICD-10-CM | POA: Diagnosis not present

## 2018-09-28 DIAGNOSIS — Z1211 Encounter for screening for malignant neoplasm of colon: Secondary | ICD-10-CM | POA: Diagnosis not present

## 2018-09-28 DIAGNOSIS — Z1239 Encounter for other screening for malignant neoplasm of breast: Secondary | ICD-10-CM | POA: Diagnosis not present

## 2018-09-28 DIAGNOSIS — I83813 Varicose veins of bilateral lower extremities with pain: Secondary | ICD-10-CM | POA: Diagnosis not present

## 2018-09-28 DIAGNOSIS — E2839 Other primary ovarian failure: Secondary | ICD-10-CM | POA: Diagnosis not present

## 2018-09-28 DIAGNOSIS — M179 Osteoarthritis of knee, unspecified: Secondary | ICD-10-CM | POA: Diagnosis not present

## 2018-10-01 ENCOUNTER — Other Ambulatory Visit: Payer: Self-pay | Admitting: Internal Medicine

## 2018-10-01 DIAGNOSIS — E2839 Other primary ovarian failure: Secondary | ICD-10-CM

## 2018-10-01 DIAGNOSIS — Z1231 Encounter for screening mammogram for malignant neoplasm of breast: Secondary | ICD-10-CM

## 2018-10-19 ENCOUNTER — Other Ambulatory Visit: Payer: Self-pay

## 2018-10-19 DIAGNOSIS — Z20828 Contact with and (suspected) exposure to other viral communicable diseases: Secondary | ICD-10-CM | POA: Diagnosis not present

## 2018-10-19 DIAGNOSIS — Z20822 Contact with and (suspected) exposure to covid-19: Secondary | ICD-10-CM

## 2018-10-20 LAB — NOVEL CORONAVIRUS, NAA: SARS-CoV-2, NAA: NOT DETECTED

## 2018-10-31 DIAGNOSIS — Z0000 Encounter for general adult medical examination without abnormal findings: Secondary | ICD-10-CM | POA: Diagnosis not present

## 2018-10-31 DIAGNOSIS — Z6833 Body mass index (BMI) 33.0-33.9, adult: Secondary | ICD-10-CM | POA: Diagnosis not present

## 2018-10-31 DIAGNOSIS — M179 Osteoarthritis of knee, unspecified: Secondary | ICD-10-CM | POA: Diagnosis not present

## 2018-10-31 DIAGNOSIS — E669 Obesity, unspecified: Secondary | ICD-10-CM | POA: Diagnosis not present

## 2018-10-31 DIAGNOSIS — Z23 Encounter for immunization: Secondary | ICD-10-CM | POA: Diagnosis not present

## 2018-10-31 DIAGNOSIS — I83813 Varicose veins of bilateral lower extremities with pain: Secondary | ICD-10-CM | POA: Diagnosis not present

## 2018-11-05 DIAGNOSIS — M25561 Pain in right knee: Secondary | ICD-10-CM | POA: Diagnosis not present

## 2018-11-05 DIAGNOSIS — M25562 Pain in left knee: Secondary | ICD-10-CM | POA: Diagnosis not present

## 2018-11-15 ENCOUNTER — Ambulatory Visit
Admission: RE | Admit: 2018-11-15 | Discharge: 2018-11-15 | Disposition: A | Discharge status: Home / Self Care | Payer: Medicaid Other | Source: Ambulatory Visit | Attending: Internal Medicine | Admitting: Internal Medicine

## 2018-11-15 ENCOUNTER — Other Ambulatory Visit: Payer: Self-pay

## 2018-11-15 DIAGNOSIS — Z1231 Encounter for screening mammogram for malignant neoplasm of breast: Secondary | ICD-10-CM

## 2018-12-10 DIAGNOSIS — M25561 Pain in right knee: Secondary | ICD-10-CM | POA: Diagnosis not present

## 2018-12-13 DIAGNOSIS — F430 Acute stress reaction: Secondary | ICD-10-CM | POA: Diagnosis not present

## 2018-12-13 DIAGNOSIS — M179 Osteoarthritis of knee, unspecified: Secondary | ICD-10-CM | POA: Diagnosis not present

## 2018-12-17 ENCOUNTER — Other Ambulatory Visit: Payer: Self-pay

## 2018-12-17 ENCOUNTER — Ambulatory Visit
Admission: RE | Admit: 2018-12-17 | Discharge: 2018-12-17 | Disposition: A | Discharge status: Home / Self Care | Payer: Medicaid Other | Source: Ambulatory Visit | Attending: Internal Medicine | Admitting: Internal Medicine

## 2018-12-17 DIAGNOSIS — E2839 Other primary ovarian failure: Secondary | ICD-10-CM

## 2018-12-17 DIAGNOSIS — Z780 Asymptomatic menopausal state: Secondary | ICD-10-CM | POA: Diagnosis not present

## 2018-12-17 DIAGNOSIS — M85851 Other specified disorders of bone density and structure, right thigh: Secondary | ICD-10-CM | POA: Diagnosis not present

## 2018-12-21 DIAGNOSIS — R200 Anesthesia of skin: Secondary | ICD-10-CM | POA: Diagnosis not present

## 2018-12-21 DIAGNOSIS — M179 Osteoarthritis of knee, unspecified: Secondary | ICD-10-CM | POA: Diagnosis not present

## 2018-12-21 DIAGNOSIS — F430 Acute stress reaction: Secondary | ICD-10-CM | POA: Diagnosis not present

## 2019-01-17 ENCOUNTER — Other Ambulatory Visit: Payer: Self-pay

## 2019-01-17 ENCOUNTER — Telehealth: Payer: Self-pay

## 2019-01-17 ENCOUNTER — Telehealth: Payer: Self-pay | Admitting: Obstetrics & Gynecology

## 2019-01-17 ENCOUNTER — Ambulatory Visit (INDEPENDENT_AMBULATORY_CARE_PROVIDER_SITE_OTHER): Payer: Medicaid Other | Admitting: Obstetrics & Gynecology

## 2019-01-17 ENCOUNTER — Encounter: Payer: Self-pay | Admitting: Obstetrics & Gynecology

## 2019-01-17 VITALS — BP 107/80 | HR 79 | Wt 203.2 lb

## 2019-01-17 DIAGNOSIS — Z0000 Encounter for general adult medical examination without abnormal findings: Secondary | ICD-10-CM

## 2019-01-17 DIAGNOSIS — R350 Frequency of micturition: Secondary | ICD-10-CM | POA: Diagnosis not present

## 2019-01-17 LAB — POCT URINALYSIS DIP (DEVICE)
Bilirubin Urine: NEGATIVE
Glucose, UA: NEGATIVE mg/dL
Ketones, ur: NEGATIVE mg/dL
Leukocytes,Ua: NEGATIVE
Nitrite: NEGATIVE
Protein, ur: NEGATIVE mg/dL
Specific Gravity, Urine: 1.015 (ref 1.005–1.030)
Urobilinogen, UA: 0.2 mg/dL (ref 0.0–1.0)
pH: 5 (ref 5.0–8.0)

## 2019-01-17 NOTE — Progress Notes (Signed)
Patient ID: Maria Logan, female   DOB: Apr 11, 1965, 54 y.o.   MRN: [id number]  Chief Complaint  Patient presents with  . Gynecologic Exam    HPI Maria Logan is a 54 y.o. female.  [id number] Patient's last menstrual period was 12/09/2014 (approximate). S/p TAH, experiencing stress after her teen daughter was harassed by police [id number] HPI  Past Medical History:  Diagnosis Date  . Fibroids   . Umbilical hernia    CT [id number]  . Vaginal delivery 1988, 1990, 2005    Past Surgical History:  Procedure Laterality Date  . ABDOMINAL HYSTERECTOMY N/A 12/23/2014   Procedure: HYSTERECTOMY ABDOMINAL;  Surgeon: Woodroe Mode, MD;  Location: [facility];  Service: Gynecology;  Laterality: N/A;  Requested 12/23/14 @ 1:00p  . BILATERAL SALPINGECTOMY Bilateral 12/23/2014   Procedure: BILATERAL SALPINGECTOMY;  Surgeon: Woodroe Mode, MD;  Location: [facility];  Service: Gynecology;  Laterality: Bilateral;  . EXAMINATION UNDER ANESTHESIA N/A 06/04/2014   Procedure: EXAM UNDER ANESTHESIA;  Surgeon: Guss Bunde, MD;  Location: [facility];  Service: Gynecology;  Laterality: N/A;  . OPERATIVE ULTRASOUND N/A 06/04/2014   Procedure: OPERATIVE ULTRASOUND;  Surgeon: Guss Bunde, MD;  Location: [facility];  Service: Gynecology;  Laterality: N/A;    Family History  Problem Relation Age of Onset  . Diabetes Mother   . Alcohol abuse Neg Hx   . Arthritis Neg Hx   . Asthma Neg Hx   . Birth defects Neg Hx   . Cancer Neg Hx   . COPD Neg Hx   . Depression Neg Hx   . Drug abuse Neg Hx   . Early death Neg Hx   . Hearing loss Neg Hx   . Heart disease Neg Hx   . Hyperlipidemia Neg Hx   . Hypertension Neg Hx   . Kidney disease Neg Hx   . Learning disabilities Neg Hx   . Mental illness Neg Hx   . Mental retardation Neg Hx   . Miscarriages / Stillbirths Neg Hx   . Stroke Neg Hx   . Vision loss Neg Hx   . Varicose Veins Neg Hx     Social History Social History   Tobacco Use  . Smoking status: Never Smoker   . Smokeless tobacco: Never Used  Substance Use Topics  . Alcohol use: No  . Drug use: No    No Known Allergies  Current Outpatient Medications  Medication Sig Dispense Refill  . estradiol (ESTRACE) 1 MG tablet Take 1 tablet (1 mg total) by mouth daily. 30 tablet 6  . ibuprofen (ADVIL,MOTRIN) 200 MG tablet Take 400-800 mg by mouth every 6 (six) hours as needed for mild pain.     . predniSONE (DELTASONE) 20 MG tablet Two daily with food 10 tablet 0  . triamterene-hydrochlorothiazide (MAXZIDE-25) 37.5-25 MG tablet Take 1 tablet by mouth daily. 20 tablet 1  . meclizine (ANTIVERT) 12.5 MG tablet Take 1 tablet (12.5 mg total) by mouth 3 (three) times daily as needed for dizziness. (Patient not taking: Reported on 01/17/2019) 30 tablet 0  . UNKNOWN TO PATIENT ?NSAID Rx per PCP     No current facility-administered medications for this visit.    Review of Systems Review of Systems  Constitutional: Positive for fatigue.  Genitourinary: Positive for frequency. Negative for menstrual problem, pelvic pain, vaginal bleeding and vaginal discharge.  Psychiatric/Behavioral: Positive for dysphoric mood and sleep disturbance. The patient is nervous/anxious.     Blood pressure 107/80, pulse 79,  weight 203 lb 3.2 oz (92.2 kg), last menstrual period 12/09/2014.  Physical Exam Physical Exam Vitals and nursing note reviewed. Exam conducted with a chaperone present.  Constitutional:      Appearance: Normal appearance.  Cardiovascular:     Rate and Rhythm: Normal rate.  Pulmonary:     Effort: Pulmonary effort is normal.  Abdominal:     General: Abdomen is flat.     Palpations: Abdomen is soft.     Tenderness: There is no abdominal tenderness.  Skin:    General: Skin is warm and dry.  Neurological:     General: No focal deficit present.     Mental Status: She is alert.  Psychiatric:        Mood and Affect: Mood normal.        Behavior: Behavior normal.    Breasts: breasts appear normal, no  suspicious masses, no skin or nipple changes or axillary nodes.  Data Reviewed   Assessment Well woman exam H/o TAH Nl breast exam and imaging  Plan Yearly exam PCP f/u as indicated    Emeterio Reeve 01/17/2019, 10:41 AM

## 2019-01-17 NOTE — Telephone Encounter (Signed)
Called pt; VM left stating I was calling in response to patient's question about results. Encouraged pt to call our office or send MyChart message.

## 2019-01-17 NOTE — Telephone Encounter (Signed)
Patient has called to see about some test results that was ran this morning.

## 2019-01-17 NOTE — Progress Notes (Signed)
Positive PHQ-9 and GAD-7. Pt seen by [facility] for medication management and counseling services. Pt reports taking anxiety and sleep medicine; cannot recall name of drug at this time.  Apolonio Schneiders RN 01/17/19

## 2019-01-17 NOTE — Patient Instructions (Signed)
Post-Traumatic Stress Disorder, Adult Post-traumatic stress disorder (PTSD) is a mental health disorder that can occur after a traumatic event, such as a threat to life, serious injury, or sexual violence. Sometimes, PTSD can occur in people who hear about trauma that occurs to a close family member or friend. PTSD can happen to anyone at any age. What are the causes? The condition may be caused by experiencing a traumatic event. What increases the risk? This condition is more likely to occur in:  Engineer, manufacturing.  People who are in circumstances where their lives are threatened.  People who have been the victim of, or witness to, a traumatic event, such as: ? Domestic violence. ? Physical or sexual abuse. ? Rape. ? A terrorist act or gun violence. ? Natural disasters. ? Accidents involving serious injury. What are the signs or symptoms? PTSD symptoms may start soon after a frightening event or months or years later. Symptoms last at least one month and tend to disrupt relationships, work, and daily activities. Symptoms of PTSD can be grouped into several categories. Intrusive symptoms This is when you re-experience the physical and emotional sensations of the traumatic event through one or more of the following ways:  Having upsetting dreams.  Feeling fear, horror, intense sadness, or anger in response to a reminder of the trauma.  Having unwanted upsetting memories while awake.  Having physical reactions triggered by reminders of the trauma, such as increased heart rate, shortness of breath, sweating, and shaking.  Having flashbacks, or feeling like you are going through the event again. Avoidance symptoms This is when you avoid anything that reminds you of the trauma. Symptoms may also include:  Losing interest or not participating in daily activities.  Feeling disconnected from or avoiding other people.  Isolating yourself. Increased arousal  symptoms You may have physical or emotional reactions triggered by your environment. Symptoms may include:  Being easily startled.  Behaving in a careless or self-destructive way.  Becoming easily irritated.  Feeling worried and nervous.  Having trouble concentrating.  Yelling at or hitting other people or objects.  Having trouble sleeping. Negative mood and thoughts  Believing that you or others are bad.  Feeling fear, horror, anger, sadness, guilt, or shame regularly.  Not being able to remember certain parts of the traumatic event.  Blaming yourself or others for the trauma.  Being unable to experience positive emotions, such as happiness or love. How is this diagnosed? PTSD is diagnosed through an assessment by a mental health professional. Maria Logan will be asked questions about your symptoms. How is this treated? Treatment for this condition may include any of the following or a combination:  Taking medicines to reduce PTSD symptoms.  Having counseling with a mental health professional or therapist who is experienced in treating PTSD.  Doing eye movement desensitization and reprocessing therapy (EMDR). This type of therapy occurs with a specialized therapist. If you have other mental health concerns, these conditions will also be treated. Follow these instructions at home: Lifestyle  Find a support group in your community. Groups are often available for [employer] veterans, trauma victims, and family members or caregivers.  Try to get 7-9 hours of sleep each night. To help with sleep: ? Keep your bedroom cool and dark. ? Do not eat a heavy meal within 1 hour of bedtime. ? Do not drink alcohol or caffeinated drinks before bed. ? Avoid screen time, such as television, computers, tablets, or mobile phones, before bed.  Do not  use illegal drugs.  Contact a local organization to find out if you are eligible for a service dog. Activity  Exercise regularly. Try to do at  least 30 minutes of physical activity most days of the week.  Practice self-calming through: ? Breathing exercises. ? Meditation. ? Yoga. ? Listening to quiet music.  Do not isolate yourself. Make connections with other people.  Consider volunteering. Volunteering can help you feel more connected. Eating and drinking  Do not drink alcohol if: ? Your health care provider tells you not to drink. ? You are pregnant, may be pregnant, or are planning to become pregnant.  If you drink alcohol: ? Limit how much you use to:  0-1 drink a day for women.  0-2 drinks a day for men. ? Be aware of how much alcohol is in your drink. In the [place] one drink equals one 12 oz bottle of beer (355 mL), one 5 oz glass of wine (148 mL), or one 1 oz glass of hard liquor (44 mL). General instructions  Take steps to help yourself feel safer at home, such as by installing a security system.  Work with a health care provider or therapist to help manage your symptoms.  Take over-the-counter and prescription medicines as told by your health care provider.  Let others know that you have PTSD and the things that may trigger symptoms. This can protect you and help them understand you better.  If your PTSD is affecting your marriage or family, seek help from a family therapist.  Make sure to let all of your health care providers know you have PTSD. This is especially important if you are having surgery or need to be admitted to the hospital.  Keep all follow-up visits as told by your health care provider. This is important. Contact a health care provider if:  Your symptoms do not get better.  You are feeling overwhelmed by your symptoms. Get help right away if:  You have thoughts of hurting yourself or others. If you ever feel like you may hurt yourself or others, or have thoughts about taking your own life, get help right away. You can go to your nearest emergency department or call:  Your local  emergency services (911 in the [place]  A suicide crisis helpline, such as the [facility] at 848-717-7615. This is open 24 hours a day. Summary  Post-traumatic stress disorder (PTSD) is a mental health disorder that can occur after a traumatic event.  Treatment for PTSD may include medicines, counseling, eye movement desensitization and reprocessing therapy (EMDR), or a combination of therapies.  Find a support group in your community.  Get help right away if you have thoughts of hurting yourself or others. This information is not intended to replace advice given to you by your health care provider. Make sure you discuss any questions you have with your health care provider. Document Revised: 03/08/2018 Document Reviewed: 03/08/2018 Elsevier Patient Education  [place].

## 2019-01-17 NOTE — Telephone Encounter (Signed)
Pt called requesting to have her UA results she had an appt today.  Pt asked if we got the results of what was growing in urine.  I informed pt that it takes three days to be able to determine if she has a UTI.  Pt verbalized understanding.  Pt then states that she has a PCP in her MyChart that she no longer sees.  I was able to end the care team with that provider.  Pt reports that she does not even go to the facility for primary care.  I advised pt that the issue should be fixed.  Pt stated thank you with no further questions.    Mel Almond, RN 01/16/18

## 2019-01-18 DIAGNOSIS — F430 Acute stress reaction: Secondary | ICD-10-CM | POA: Diagnosis not present

## 2019-01-18 LAB — URINE CULTURE

## 2019-01-24 DIAGNOSIS — F430 Acute stress reaction: Secondary | ICD-10-CM | POA: Diagnosis not present

## 2019-01-24 DIAGNOSIS — Z1211 Encounter for screening for malignant neoplasm of colon: Secondary | ICD-10-CM | POA: Diagnosis not present

## 2019-01-24 DIAGNOSIS — M179 Osteoarthritis of knee, unspecified: Secondary | ICD-10-CM | POA: Diagnosis not present

## 2019-01-24 DIAGNOSIS — I83813 Varicose veins of bilateral lower extremities with pain: Secondary | ICD-10-CM | POA: Diagnosis not present

## 2019-01-24 DIAGNOSIS — R3121 Asymptomatic microscopic hematuria: Secondary | ICD-10-CM | POA: Diagnosis not present

## 2019-01-31 ENCOUNTER — Ambulatory Visit: Payer: Medicaid Other | Attending: Internal Medicine

## 2019-01-31 DIAGNOSIS — Z20822 Contact with and (suspected) exposure to covid-19: Secondary | ICD-10-CM

## 2019-01-31 DIAGNOSIS — F430 Acute stress reaction: Secondary | ICD-10-CM | POA: Diagnosis not present

## 2019-02-01 LAB — NOVEL CORONAVIRUS, NAA: SARS-CoV-2, NAA: DETECTED — AB

## 2019-02-02 ENCOUNTER — Encounter: Payer: Self-pay | Admitting: Infectious Diseases

## 2019-02-02 DIAGNOSIS — I10 Essential (primary) hypertension: Secondary | ICD-10-CM | POA: Insufficient documentation

## 2019-02-08 ENCOUNTER — Ambulatory Visit: Payer: Medicaid Other | Attending: Internal Medicine

## 2019-02-08 DIAGNOSIS — Z20822 Contact with and (suspected) exposure to covid-19: Secondary | ICD-10-CM

## 2019-02-09 LAB — NOVEL CORONAVIRUS, NAA: SARS-CoV-2, NAA: NOT DETECTED

## 2019-02-21 DIAGNOSIS — M179 Osteoarthritis of knee, unspecified: Secondary | ICD-10-CM | POA: Diagnosis not present

## 2019-02-21 DIAGNOSIS — I83813 Varicose veins of bilateral lower extremities with pain: Secondary | ICD-10-CM | POA: Diagnosis not present

## 2019-02-21 DIAGNOSIS — F430 Acute stress reaction: Secondary | ICD-10-CM | POA: Diagnosis not present

## 2019-03-02 IMAGING — CR DG CHEST 2V
2 series · 2 of 2 positions shown · non-contrast
Comparison: 06/30/2009.  08/09/2004.a

CLINICAL DATA: Cough.  Shortness of breath .

EXAM:
CHEST  2 VIEW

[w chest pa]
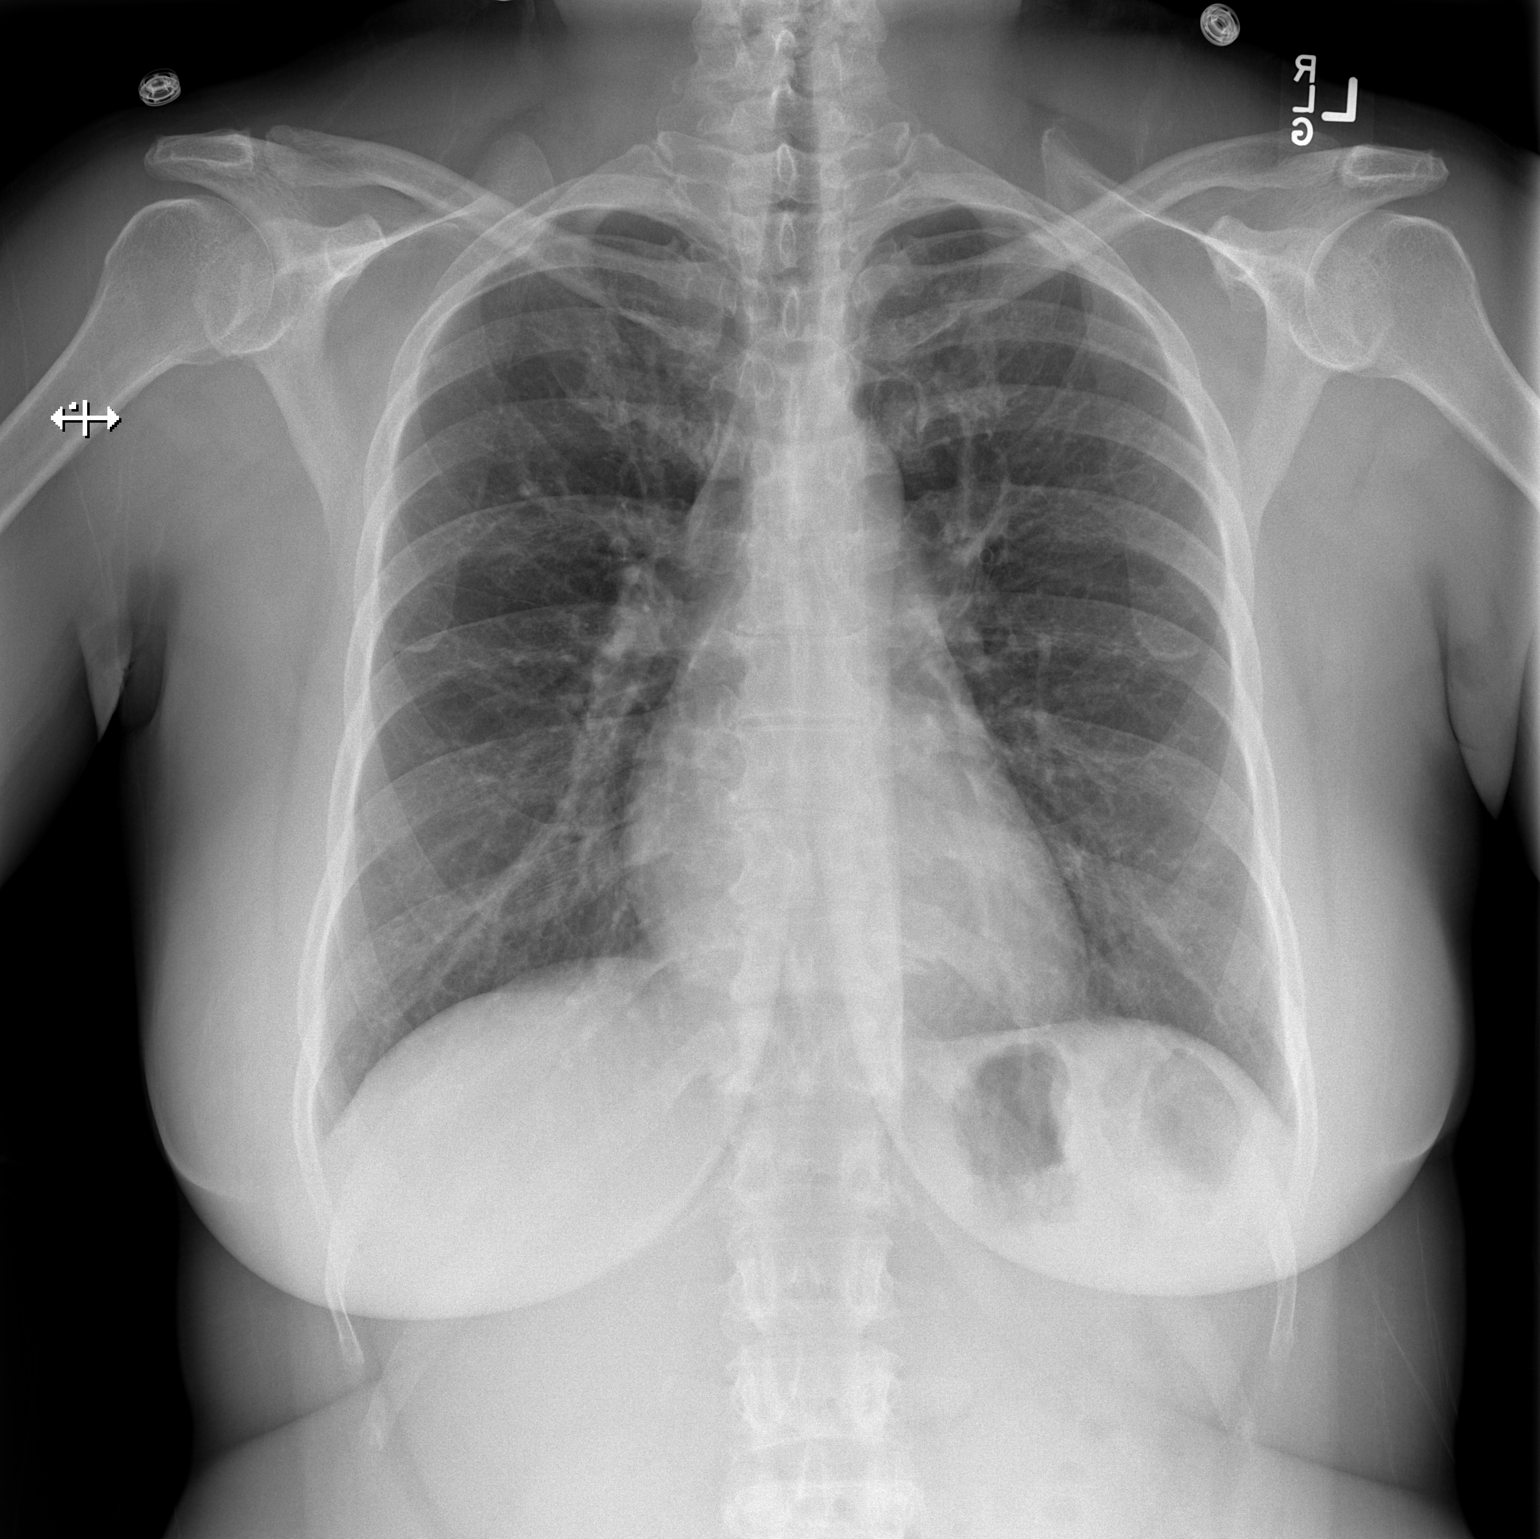

[w chest lat]
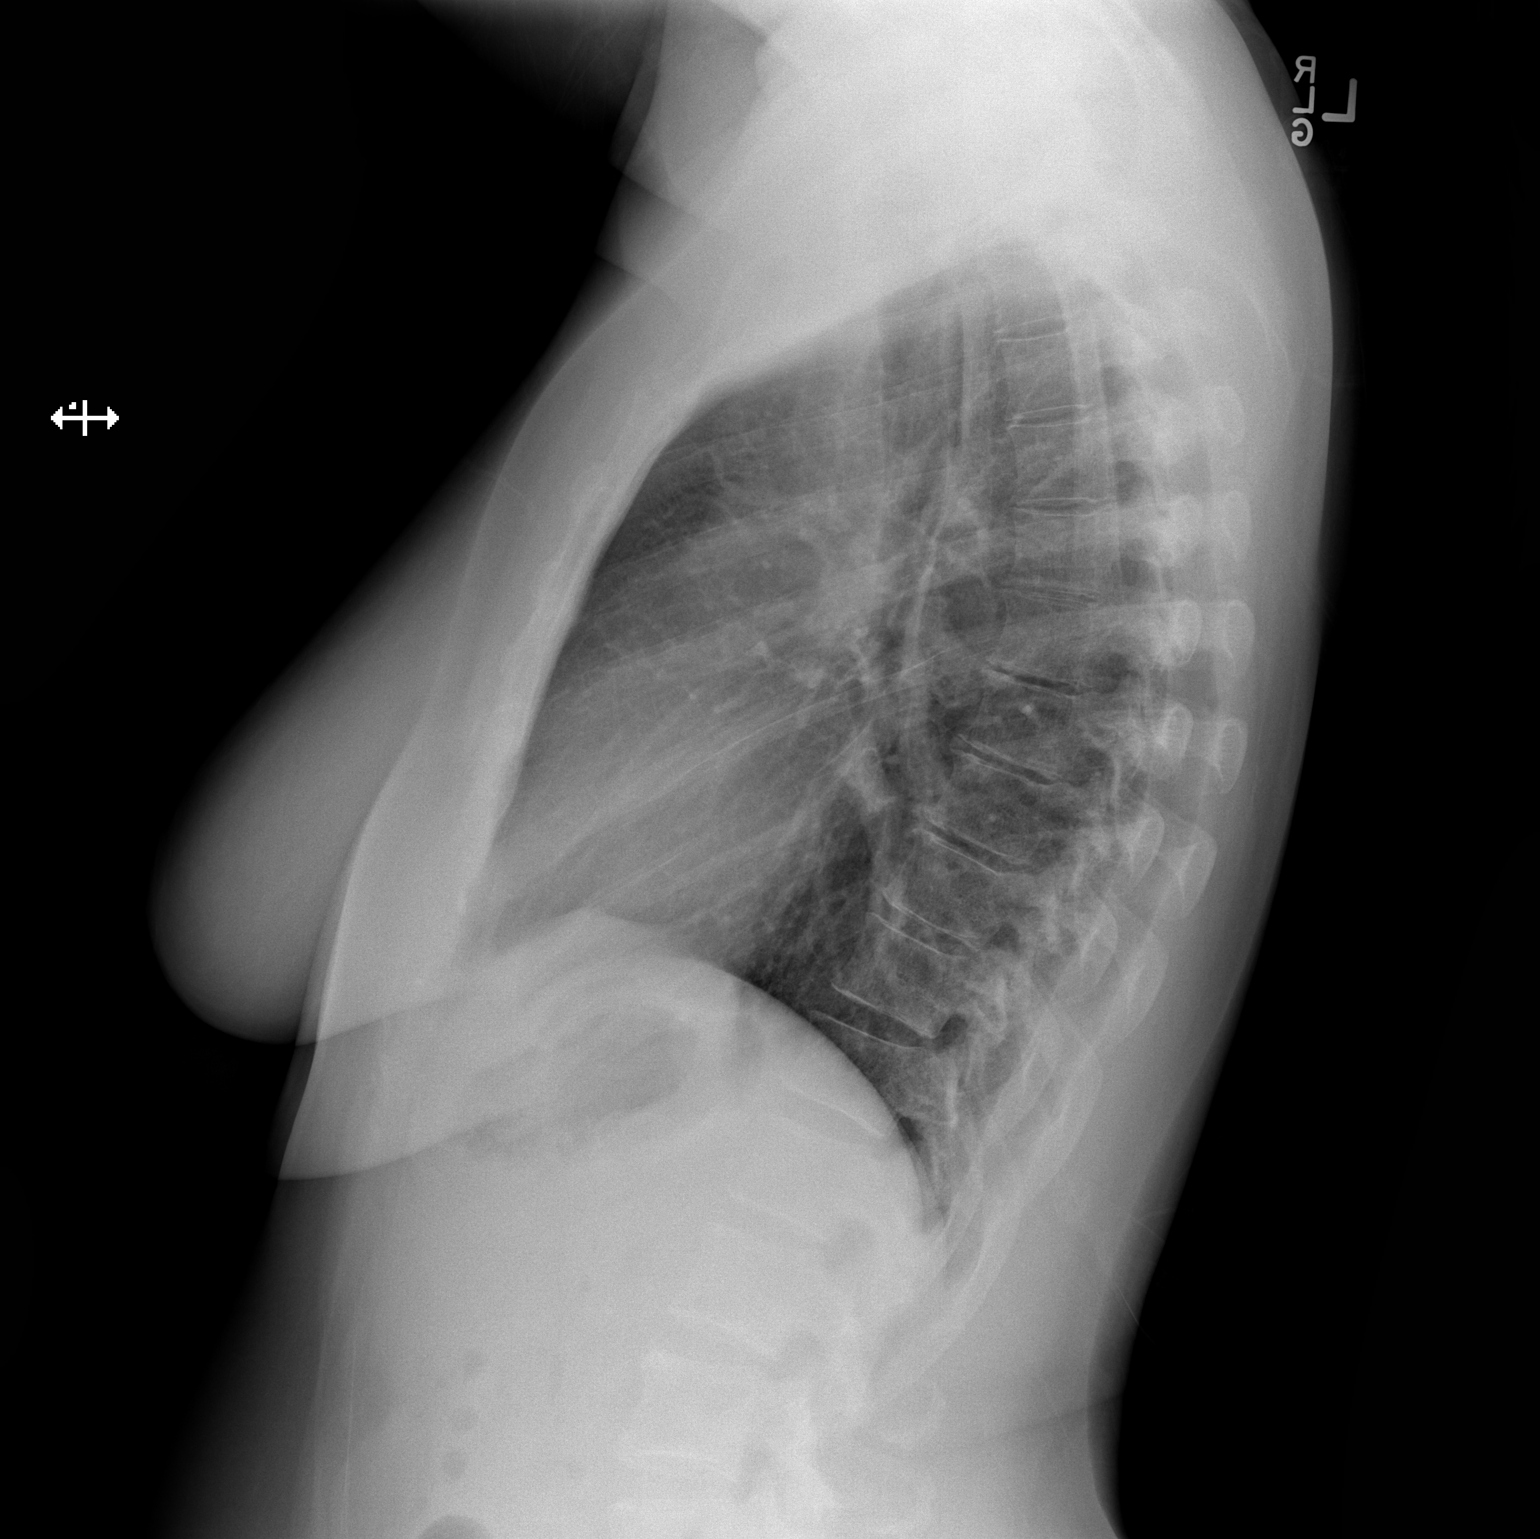

[2 of 2 positions shown; findings below may reference images not displayed]

FINDINGS: Mediastinum and hilar structures normal. Subcentimeter nodular
density projected over the right upper lobe is noted. Nonenhanced
chest CT is suggested to further evaluate. No pleural effusion or
pneumothorax. Heart size normal. No acute bony abnormality . No
pleural effusion or pneumothorax. Heart size normal. No acute bony
abnormality .
IMPRESSION: Small subcentimeter nodular density noted over the right upper lobe.
Nonenhanced chest CT suggested for further evaluation. Exam
otherwise unremarkable .

## 2019-03-21 DIAGNOSIS — R9089 Other abnormal findings on diagnostic imaging of central nervous system: Secondary | ICD-10-CM | POA: Diagnosis not present

## 2019-03-21 DIAGNOSIS — I671 Cerebral aneurysm, nonruptured: Secondary | ICD-10-CM | POA: Diagnosis not present

## 2019-03-26 DIAGNOSIS — F430 Acute stress reaction: Secondary | ICD-10-CM | POA: Diagnosis not present

## 2019-03-26 DIAGNOSIS — M179 Osteoarthritis of knee, unspecified: Secondary | ICD-10-CM | POA: Diagnosis not present

## 2019-03-26 DIAGNOSIS — Z1211 Encounter for screening for malignant neoplasm of colon: Secondary | ICD-10-CM | POA: Diagnosis not present

## 2019-03-26 DIAGNOSIS — I83813 Varicose veins of bilateral lower extremities with pain: Secondary | ICD-10-CM | POA: Diagnosis not present

## 2019-04-19 ENCOUNTER — Ambulatory Visit: Payer: Medicaid Other | Attending: Internal Medicine

## 2019-04-19 DIAGNOSIS — Z23 Encounter for immunization: Secondary | ICD-10-CM

## 2019-04-19 NOTE — Progress Notes (Signed)
   Covid-19 Vaccination Clinic  Name:  Maria Logan    MRN: [id number] DOB: May 10, 1965  04/19/2019  [name] was observed post Covid-19 immunization for 15 minutes without incident. She was provided with Vaccine Information Sheet and instruction to access the V-Safe system.   [name] was instructed to call 911 with any severe reactions post vaccine: Marland Kitchen Difficulty breathing  . Swelling of face and throat  . A fast heartbeat  . A bad rash all over body  . Dizziness and weakness   Immunizations Administered    Name Date Dose VIS Date Route   Pfizer COVID-19 Vaccine 04/19/2019  8:14 AM 0.3 mL 12/21/2018 Intramuscular   Manufacturer: [place]   Lot: [id number]   [place]: [id number]

## 2019-05-02 DIAGNOSIS — R9089 Other abnormal findings on diagnostic imaging of central nervous system: Secondary | ICD-10-CM | POA: Diagnosis not present

## 2019-05-02 DIAGNOSIS — Z6829 Body mass index (BMI) 29.0-29.9, adult: Secondary | ICD-10-CM | POA: Diagnosis not present

## 2019-05-02 DIAGNOSIS — R030 Elevated blood-pressure reading, without diagnosis of hypertension: Secondary | ICD-10-CM | POA: Diagnosis not present

## 2019-05-07 DIAGNOSIS — Z6833 Body mass index (BMI) 33.0-33.9, adult: Secondary | ICD-10-CM | POA: Diagnosis not present

## 2019-05-07 DIAGNOSIS — E559 Vitamin D deficiency, unspecified: Secondary | ICD-10-CM | POA: Diagnosis not present

## 2019-05-07 DIAGNOSIS — Z131 Encounter for screening for diabetes mellitus: Secondary | ICD-10-CM | POA: Diagnosis not present

## 2019-05-07 DIAGNOSIS — E669 Obesity, unspecified: Secondary | ICD-10-CM | POA: Diagnosis not present

## 2019-05-07 DIAGNOSIS — Z13220 Encounter for screening for lipoid disorders: Secondary | ICD-10-CM | POA: Diagnosis not present

## 2019-05-07 DIAGNOSIS — Z0000 Encounter for general adult medical examination without abnormal findings: Secondary | ICD-10-CM | POA: Diagnosis not present

## 2019-05-13 ENCOUNTER — Ambulatory Visit: Payer: Medicaid Other | Attending: Internal Medicine

## 2019-05-13 DIAGNOSIS — Z23 Encounter for immunization: Secondary | ICD-10-CM

## 2019-05-13 NOTE — Progress Notes (Signed)
   Covid-19 Vaccination Clinic  Name:  Maria Logan    MRN: [id number] DOB: 06-15-1965  05/13/2019  Maria Logan was observed post Covid-19 immunization for 15 minutes without incident. She was provided with Vaccine Information Sheet and instruction to access the V-Safe system.   Maria Logan was instructed to call 911 with any severe reactions post vaccine: Marland Kitchen Difficulty breathing  . Swelling of face and throat  . A fast heartbeat  . A bad rash all over body  . Dizziness and weakness   Immunizations Administered    Name Date Dose VIS Date Route   Pfizer COVID-19 Vaccine 05/13/2019  9:57 AM 0.3 mL 03/06/2018 Intramuscular   Manufacturer: [place]   Lot: [id number]   [place]: [id number]

## 2019-05-23 DIAGNOSIS — E559 Vitamin D deficiency, unspecified: Secondary | ICD-10-CM | POA: Diagnosis not present

## 2019-05-23 DIAGNOSIS — F430 Acute stress reaction: Secondary | ICD-10-CM | POA: Diagnosis not present

## 2019-05-25 ENCOUNTER — Other Ambulatory Visit: Payer: Self-pay

## 2019-05-25 ENCOUNTER — Encounter (HOSPITAL_COMMUNITY): Payer: Self-pay | Admitting: Emergency Medicine

## 2019-05-25 DIAGNOSIS — N3001 Acute cystitis with hematuria: Secondary | ICD-10-CM | POA: Diagnosis not present

## 2019-05-25 DIAGNOSIS — Z79899 Other long term (current) drug therapy: Secondary | ICD-10-CM | POA: Diagnosis not present

## 2019-05-25 DIAGNOSIS — I10 Essential (primary) hypertension: Secondary | ICD-10-CM | POA: Insufficient documentation

## 2019-05-25 DIAGNOSIS — R5383 Other fatigue: Secondary | ICD-10-CM | POA: Diagnosis not present

## 2019-05-25 LAB — URINALYSIS, ROUTINE W REFLEX MICROSCOPIC
Bilirubin Urine: NEGATIVE
Glucose, UA: NEGATIVE mg/dL
Ketones, ur: NEGATIVE mg/dL
Nitrite: NEGATIVE
Protein, ur: NEGATIVE mg/dL
Specific Gravity, Urine: 1.016 (ref 1.005–1.030)
pH: 5 (ref 5.0–8.0)

## 2019-05-25 NOTE — ED Triage Notes (Addendum)
Patient reports fatigue with nausea x2 weeks. Denies V/D. Denies cough, fever. Also c/o urinary frequency.

## 2019-05-26 ENCOUNTER — Emergency Department (HOSPITAL_COMMUNITY)
Admission: EM | Admit: 2019-05-26 | Discharge: 2019-05-26 | Disposition: A | Discharge status: Home / Self Care | Payer: Medicaid Other | Attending: Emergency Medicine | Admitting: Emergency Medicine

## 2019-05-26 DIAGNOSIS — R5383 Other fatigue: Secondary | ICD-10-CM

## 2019-05-26 DIAGNOSIS — N3001 Acute cystitis with hematuria: Secondary | ICD-10-CM

## 2019-05-26 LAB — BASIC METABOLIC PANEL
Anion gap: 9 (ref 5–15)
BUN: 15 mg/dL (ref 6–20)
CO2: 28 mmol/L (ref 22–32)
Calcium: 9.4 mg/dL (ref 8.9–10.3)
Chloride: 104 mmol/L (ref 98–111)
Creatinine, Ser: 0.93 mg/dL (ref 0.44–1.00)
GFR calc Af Amer: >60 mL/min (ref >60)
GFR calc non Af Amer: >60 mL/min (ref >60)
Glucose, Bld: 90 mg/dL (ref 70–99)
Potassium: 3.8 mmol/L (ref 3.5–5.1)
Sodium: 141 mmol/L (ref 135–145)

## 2019-05-26 LAB — CBC WITH DIFFERENTIAL/PLATELET
Abs Immature Granulocytes: 0.01 10*3/uL (ref 0.00–0.07)
Basophils Absolute: 0.1 10*3/uL (ref 0.0–0.1)
Basophils Relative: 1 %
Eosinophils Absolute: 0.1 10*3/uL (ref 0.0–0.5)
Eosinophils Relative: 2 %
HCT: 42.4 % (ref 36.0–46.0)
Hemoglobin: 13.7 g/dL (ref 12.0–15.0)
Immature Granulocytes: 0 %
Lymphocytes Relative: 26 %
Lymphs Abs: 1.7 10*3/uL (ref 0.7–4.0)
MCH: 30.3 pg (ref 26.0–34.0)
MCHC: 32.3 g/dL (ref 30.0–36.0)
MCV: 93.8 fL (ref 80.0–100.0)
Monocytes Absolute: 0.6 10*3/uL (ref 0.1–1.0)
Monocytes Relative: 9 %
Neutro Abs: 4.1 10*3/uL (ref 1.7–7.7)
Neutrophils Relative %: 62 %
Platelets: 224 10*3/uL (ref 150–400)
RBC: 4.52 MIL/uL (ref 3.87–5.11)
RDW: 12.2 % (ref 11.5–15.5)
WBC: 6.6 10*3/uL (ref 4.0–10.5)
nRBC: 0 % (ref 0.0–0.2)

## 2019-05-26 LAB — TSH: TSH: 1.682 u[IU]/mL (ref 0.350–4.500)

## 2019-05-26 MED ORDER — CEPHALEXIN 500 MG PO CAPS
500.0000 mg | ORAL_CAPSULE | Freq: Three times a day (TID) | ORAL | 0 refills | Status: DC
Start: 2019-05-26 — End: 2021-06-28

## 2019-05-26 NOTE — Discharge Instructions (Signed)
Take the prescribed medication as directed. Follow-up with your primary care doctor.  Copies of labs on back for them to review. Return to the ED for new or worsening symptoms.

## 2019-05-26 NOTE — ED Provider Notes (Signed)
[name] Note   CSN: [id number] Arrival date & time: 05/25/19  1908     History Chief Complaint  Patient presents with  . Fatigue    Maria Logan is a 54 y.o. female.  The history is provided by the patient and medical records.    54 y.o. F with hx of uterine fibroids s/p hysterectomy, newly diagnosed Vit D deficiency, presenting to the ED for extreme fatigue.  States she has been feeling poorly for about 2 weeks.  She got her 2nd covid vaccine (pfiezer) on 05/16/19, had some arm soreness and body aches for a few days but does not resolve.  States she never feels like her energy came back.  States she sleeps at night, wakes up in the morning extremely fatigued, will do minimal activities such as running and air under 2, comes back home, and has to go back to bed which is very atypical for her.  States she eats a very modest diet, mostly fish and vegetables, however continues to gain weight.  She also reports feeling very cold and irritable recently.  She has been experiencing some urinary frequency but denies any dysuria or hematuria.  She denies any pelvic pain or vaginal discharge.  She has been started on vitamin D supplements by her primary care doctor.  No history of anemia or thyroid disease.  No abdominal pain.  No chest pain or shortness of breath.  Past Medical History:  Diagnosis Date  . Fibroids   . Umbilical hernia    CT 99991111  . Vaginal delivery 1988, 1990, 2005    Patient Active Problem List   Diagnosis Date Noted  . Hypertension 02/02/2019  . S/P TAH (total abdominal hysterectomy) 12/24/2014    Past Surgical History:  Procedure Laterality Date  . ABDOMINAL HYSTERECTOMY N/A 12/23/2014   Procedure: HYSTERECTOMY ABDOMINAL;  Surgeon: Woodroe Mode, MD;  Location: [facility];  Service: Gynecology;  Laterality: N/A;  Requested 12/23/14 @ 1:00p  . BILATERAL SALPINGECTOMY Bilateral 12/23/2014   Procedure: BILATERAL  SALPINGECTOMY;  Surgeon: Woodroe Mode, MD;  Location: [facility];  Service: Gynecology;  Laterality: Bilateral;  . EXAMINATION UNDER ANESTHESIA N/A 06/04/2014   Procedure: EXAM UNDER ANESTHESIA;  Surgeon: Guss Bunde, MD;  Location: [facility];  Service: Gynecology;  Laterality: N/A;  . OPERATIVE ULTRASOUND N/A 06/04/2014   Procedure: OPERATIVE ULTRASOUND;  Surgeon: Guss Bunde, MD;  Location: [facility];  Service: Gynecology;  Laterality: N/A;     OB History    Gravida  3   Para  3   Term  3   Preterm      AB  0   Living  3     SAB  0   TAB      Ectopic      Multiple      Live Births              Family History  Problem Relation Age of Onset  . Diabetes Mother   . Alcohol abuse Neg Hx   . Arthritis Neg Hx   . Asthma Neg Hx   . Birth defects Neg Hx   . Cancer Neg Hx   . COPD Neg Hx   . Depression Neg Hx   . Drug abuse Neg Hx   . Early death Neg Hx   . Hearing loss Neg Hx   . Heart disease Neg Hx   . Hyperlipidemia Neg Hx   . Hypertension  Neg Hx   . Kidney disease Neg Hx   . Learning disabilities Neg Hx   . Mental illness Neg Hx   . Mental retardation Neg Hx   . Miscarriages / Stillbirths Neg Hx   . Stroke Neg Hx   . Vision loss Neg Hx   . Varicose Veins Neg Hx     Social History   Tobacco Use  . Smoking status: Never Smoker  . Smokeless tobacco: Never Used  Substance Use Topics  . Alcohol use: No  . Drug use: No    Home Medications Prior to Admission medications   Medication Sig Start Date End Date Taking? Authorizing Provider  estradiol (ESTRACE) 1 MG tablet Take 1 tablet (1 mg total) by mouth daily. 12/04/17   Woodroe Mode, MD  ibuprofen (ADVIL,MOTRIN) 200 MG tablet Take 400-800 mg by mouth every 6 (six) hours as needed for mild pain.     [provider]  meclizine (ANTIVERT) 12.5 MG tablet Take 1 tablet (12.5 mg total) by mouth 3 (three) times daily as needed for dizziness. Patient not taking: Reported on 01/17/2019 05/10/18    Robyn Haber, MD  predniSONE (DELTASONE) 20 MG tablet Two daily with food 08/18/18   Robyn Haber, MD  triamterene-hydrochlorothiazide (MAXZIDE-25) 37.5-25 MG tablet Take 1 tablet by mouth daily. 08/18/18   Robyn Haber, MD  UNKNOWN TO PATIENT ?NSAID Rx per PCP    [provider]    Allergies    Patient has no known allergies.  Review of Systems   Review of Systems  Constitutional: Positive for fatigue.  Genitourinary: Positive for frequency.  All other systems reviewed and are negative.   Physical Exam Updated Vital Signs BP (!) 149/85   Pulse (!) 56   Temp 98.1 F (36.7 C) (Oral)   Resp 16   LMP 12/09/2014 (Approximate)   SpO2 100%   Physical Exam Vitals and nursing note reviewed.  Constitutional:      Appearance: She is well-developed.  HENT:     Head: Normocephalic and atraumatic.  Eyes:     Conjunctiva/sclera: Conjunctivae normal.     Pupils: Pupils are equal, round, and reactive to light.  Cardiovascular:     Rate and Rhythm: Normal rate and regular rhythm.     Heart sounds: Normal heart sounds.  Pulmonary:     Effort: Pulmonary effort is normal. No respiratory distress.     Breath sounds: Normal breath sounds. No rhonchi.  Abdominal:     General: Bowel sounds are normal.     Palpations: Abdomen is soft.     Tenderness: There is no abdominal tenderness. There is no rebound.     Comments: Soft, nontender, no CVA tenderness  Musculoskeletal:        General: Normal range of motion.     Cervical back: Normal range of motion.  Skin:    General: Skin is warm and dry.  Neurological:     Mental Status: She is alert and oriented to person, place, and time.     ED Results / Procedures / Treatments   Labs (all labs ordered are listed, but only abnormal results are displayed) Labs Reviewed  URINALYSIS, ROUTINE W REFLEX MICROSCOPIC - Abnormal; Notable for the following components:      Result Value   Hgb urine dipstick LARGE (*)     Leukocytes,Ua SMALL (*)    Bacteria, UA MANY (*)    All other components within normal limits  CBC WITH DIFFERENTIAL/PLATELET  BASIC METABOLIC  PANEL  TSH    EKG None  Radiology No results found.  Procedures Procedures (including critical care time)  Medications Ordered in ED Medications - No data to display  ED Course  I have reviewed the triage vital signs and the nursing notes.  Pertinent labs & imaging results that were available during my care of the patient were reviewed by me and considered in my medical decision making (see chart for details).    MDM Rules/Calculators/A&P  53 year old female here with 2 weeks of fatigue, intermittent nausea, and urinary frequency.  She had a second dose of her Pfizer Covid vaccine about 10 days ago, unsure if it is related to that.  She is afebrile and nontoxic.  Abdomen is soft and benign, no CVA tenderness.  UA does appear infectious.  He does report recently being diagnosed with vitamin D deficiency, unsure of any other lab abnormalities.  Labs here today are very reassuring--no significant anemia, electrolyte derangement, renal impairment.  TSH is normal.  May be lingering effects from her Covid vaccine along with UTI.  Will treat with course of Keflex and have her follow-up closely with her primary care doctor.  She may return here for any new or acute changes.  Final Clinical Impression(s) / ED Diagnoses Final diagnoses:  Fatigue, unspecified type  Acute cystitis with hematuria    Rx / DC Orders ED Discharge Orders         Ordered    cephALEXin (KEFLEX) 500 MG capsule  3 times daily     05/26/19 0441           Larene Pickett, PA-C 05/26/19 0445    Mesner, Corene Cornea, MD 05/26/19 [phone]

## 2019-11-05 DIAGNOSIS — E559 Vitamin D deficiency, unspecified: Secondary | ICD-10-CM | POA: Diagnosis not present

## 2019-11-05 DIAGNOSIS — F430 Acute stress reaction: Secondary | ICD-10-CM | POA: Diagnosis not present

## 2019-11-05 DIAGNOSIS — M179 Osteoarthritis of knee, unspecified: Secondary | ICD-10-CM | POA: Diagnosis not present

## 2019-11-05 DIAGNOSIS — N760 Acute vaginitis: Secondary | ICD-10-CM | POA: Diagnosis not present

## 2019-11-28 DIAGNOSIS — M179 Osteoarthritis of knee, unspecified: Secondary | ICD-10-CM | POA: Diagnosis not present

## 2019-11-28 DIAGNOSIS — F430 Acute stress reaction: Secondary | ICD-10-CM | POA: Diagnosis not present

## 2019-11-28 DIAGNOSIS — N760 Acute vaginitis: Secondary | ICD-10-CM | POA: Diagnosis not present

## 2019-12-13 DIAGNOSIS — F430 Acute stress reaction: Secondary | ICD-10-CM | POA: Diagnosis not present

## 2019-12-13 DIAGNOSIS — M179 Osteoarthritis of knee, unspecified: Secondary | ICD-10-CM | POA: Diagnosis not present

## 2019-12-13 DIAGNOSIS — J069 Acute upper respiratory infection, unspecified: Secondary | ICD-10-CM | POA: Diagnosis not present

## 2019-12-13 DIAGNOSIS — E669 Obesity, unspecified: Secondary | ICD-10-CM | POA: Diagnosis not present

## 2019-12-17 ENCOUNTER — Encounter (HOSPITAL_COMMUNITY): Payer: Self-pay

## 2019-12-17 ENCOUNTER — Other Ambulatory Visit: Payer: Self-pay

## 2019-12-17 ENCOUNTER — Ambulatory Visit (HOSPITAL_COMMUNITY)
Admission: EM | Admit: 2019-12-17 | Discharge: 2019-12-17 | Disposition: A | Discharge status: Home / Self Care | Payer: Medicaid Other | Attending: Emergency Medicine | Admitting: Emergency Medicine

## 2019-12-17 DIAGNOSIS — Z20822 Contact with and (suspected) exposure to covid-19: Secondary | ICD-10-CM | POA: Diagnosis not present

## 2019-12-17 DIAGNOSIS — R059 Cough, unspecified: Secondary | ICD-10-CM

## 2019-12-17 MED ORDER — CETIRIZINE HCL 10 MG PO TABS: 10.0000 mg | ORAL_TABLET | Freq: Every day | ORAL | 0 refills | Status: AC

## 2019-12-17 MED ORDER — BENZONATATE 100 MG PO CAPS: 100.0000 mg | ORAL_CAPSULE | Freq: Three times a day (TID) | ORAL | 0 refills | Status: AC | PRN

## 2019-12-17 NOTE — ED Triage Notes (Signed)
Pt presents with cough X 2 days.

## 2019-12-17 NOTE — ED Provider Notes (Signed)
[facility]    CSN: [id number] Arrival date & time: 12/17/19  1151      History   Chief Complaint Chief Complaint  Patient presents with  . Cough    HPI Maria Logan is a 54 y.o. female.   Maria Logan presents with complaints of cough for the past few days. No shortness of breath . No congestion. No headache, fevers, ear pain or sore throat. Work requested she seek evaluation. No known ill contacts. Has tried taking dayquil/nyquil which haven't helped. No chest pain.    ROS per HPI, negative if not otherwise mentioned.      Past Medical History:  Diagnosis Date  . Fibroids   . Umbilical hernia    CT 7/90/24  . Vaginal delivery 1988, 1990, 2005    Patient Active Problem List   Diagnosis Date Noted  . Hypertension 02/02/2019  . S/P TAH (total abdominal hysterectomy) 12/24/2014    Past Surgical History:  Procedure Laterality Date  . ABDOMINAL HYSTERECTOMY N/A 12/23/2014   Procedure: HYSTERECTOMY ABDOMINAL;  Surgeon: Woodroe Mode, MD;  Location: [facility];  Service: Gynecology;  Laterality: N/A;  Requested 12/23/14 @ 1:00p  . BILATERAL SALPINGECTOMY Bilateral 12/23/2014   Procedure: BILATERAL SALPINGECTOMY;  Surgeon: Woodroe Mode, MD;  Location: [facility];  Service: Gynecology;  Laterality: Bilateral;  . EXAMINATION UNDER ANESTHESIA N/A 06/04/2014   Procedure: EXAM UNDER ANESTHESIA;  Surgeon: Guss Bunde, MD;  Location: [facility];  Service: Gynecology;  Laterality: N/A;  . OPERATIVE ULTRASOUND N/A 06/04/2014   Procedure: OPERATIVE ULTRASOUND;  Surgeon: Guss Bunde, MD;  Location: [facility];  Service: Gynecology;  Laterality: N/A;    OB History    Gravida  3   Para  3   Term  3   Preterm      AB  0   Living  3     SAB  0   TAB      Ectopic      Multiple      Live Births               Home Medications    Prior to Admission medications   Medication Sig Start Date End Date Taking? Authorizing Provider  benzonatate  (TESSALON) 100 MG capsule Take 1-2 capsules (100-200 mg total) by mouth every 8 (eight) hours as needed for cough. 12/17/19   Zigmund Gottron, NP  cephALEXin (KEFLEX) 500 MG capsule Take 1 capsule (500 mg total) by mouth 3 (three) times daily. 05/26/19   Larene Pickett, PA-C  cetirizine (ZYRTEC) 10 MG tablet Take 1 tablet (10 mg total) by mouth daily. 12/17/19   Zigmund Gottron, NP  estradiol (ESTRACE) 1 MG tablet Take 1 tablet (1 mg total) by mouth daily. Patient not taking: Reported on 05/26/2019 12/04/17   Woodroe Mode, MD  meclizine (ANTIVERT) 12.5 MG tablet Take 1 tablet (12.5 mg total) by mouth 3 (three) times daily as needed for dizziness. Patient not taking: Reported on 01/17/2019 05/10/18   Robyn Haber, MD  predniSONE (DELTASONE) 20 MG tablet Two daily with food Patient not taking: Reported on 05/26/2019 08/18/18   Robyn Haber, MD  sertraline (ZOLOFT) 50 MG tablet Take 50 mg by mouth daily.    [provider]  triamterene-hydrochlorothiazide (MAXZIDE-25) 37.5-25 MG tablet Take 1 tablet by mouth daily. Patient not taking: Reported on 05/26/2019 08/18/18   Robyn Haber, MD  Vitamin D, Ergocalciferol, (DRISDOL) 1.25 MG (50000 UNIT) CAPS capsule  Take 50,000 Units by mouth every Friday.    [provider]    Family History Family History  Problem Relation Age of Onset  . Diabetes Mother   . Alcohol abuse Neg Hx   . Arthritis Neg Hx   . Asthma Neg Hx   . Birth defects Neg Hx   . Cancer Neg Hx   . COPD Neg Hx   . Depression Neg Hx   . Drug abuse Neg Hx   . Early death Neg Hx   . Hearing loss Neg Hx   . Heart disease Neg Hx   . Hyperlipidemia Neg Hx   . Hypertension Neg Hx   . Kidney disease Neg Hx   . Learning disabilities Neg Hx   . Mental illness Neg Hx   . Mental retardation Neg Hx   . Miscarriages / Stillbirths Neg Hx   . Stroke Neg Hx   . Vision loss Neg Hx   . Varicose Veins Neg Hx     Social History Social History   Tobacco Use  .  Smoking status: Never Smoker  . Smokeless tobacco: Never Used  Vaping Use  . Vaping Use: Never used  Substance Use Topics  . Alcohol use: No  . Drug use: No     Allergies   Patient has no known allergies.   Review of Systems Review of Systems   Physical Exam Triage Vital Signs ED Triage Vitals  Enc Vitals Group     BP 12/17/19 1357 121/74     Pulse Rate 12/17/19 1357 69     Resp 12/17/19 1357 18     Temp 12/17/19 1357 98.8 F (37.1 C)     Temp Source 12/17/19 1357 Oral     SpO2 12/17/19 1357 100 %     Weight --      Height --      Head Circumference --      Peak Flow --      Pain Score 12/17/19 1355 0     Pain Loc --      Pain Edu? --      Excl. in [place]? --    No data found.  Updated Vital Signs BP 121/74 (BP Location: Right Arm)   Pulse 69   Temp 98.8 F (37.1 C) (Oral)   Resp 18   LMP 12/09/2014 (Approximate)   SpO2 100%   Visual Acuity Right Eye Distance:   Left Eye Distance:   Bilateral Distance:    Right Eye Near:   Left Eye Near:    Bilateral Near:     Physical Exam Constitutional:      General: She is not in acute distress.    Appearance: She is well-developed.  Cardiovascular:     Rate and Rhythm: Normal rate.  Pulmonary:     Effort: Pulmonary effort is normal.  Skin:    General: Skin is warm and dry.  Neurological:     Mental Status: She is alert and oriented to person, place, and time.      UC Treatments / Results  Labs (all labs ordered are listed, but only abnormal results are displayed) Labs Reviewed  SARS CORONAVIRUS 2 (TAT 6-24 HRS)    EKG   Radiology No results found.  Procedures Procedures (including critical care time)  Medications Ordered in UC Medications - No data to display  Initial Impression / Assessment and Plan / UC Course  I have reviewed the triage vital signs and the nursing notes.  Pertinent labs & imaging results that were available during my care of the patient were reviewed by me and  considered in my medical decision making (see chart for details).     Non toxic. Benign physical exam.  History and physical consistent with viral illness.  Covid testing pending and isolation instructions provided.  Supportive cares recommended. Return precautions provided. Patient verbalized understanding and agreeable to plan.   Final Clinical Impressions(s) / UC Diagnoses   Final diagnoses:  Cough     Discharge Instructions     Push fluids to ensure adequate hydration and keep secretions thin.  Daily zyrtec.  Tessalon as needed.  If symptoms worsen or do not improve in the next week to return to be seen or to follow up with your PCP.     ED Prescriptions    Medication Sig Dispense Auth. Provider   cetirizine (ZYRTEC) 10 MG tablet Take 1 tablet (10 mg total) by mouth daily. 30 tablet Augusto Gamble B, NP   benzonatate (TESSALON) 100 MG capsule Take 1-2 capsules (100-200 mg total) by mouth every 8 (eight) hours as needed for cough. 21 capsule Zigmund Gottron, NP     PDMP not reviewed this encounter.   Zigmund Gottron, NP 12/17/19 1450

## 2019-12-17 NOTE — Discharge Instructions (Signed)
Push fluids to ensure adequate hydration and keep secretions thin.  Daily zyrtec.  Tessalon as needed.  If symptoms worsen or do not improve in the next week to return to be seen or to follow up with your PCP.

## 2019-12-18 LAB — SARS CORONAVIRUS 2 (TAT 6-24 HRS): SARS Coronavirus 2: NEGATIVE

## 2020-05-27 ENCOUNTER — Emergency Department (HOSPITAL_COMMUNITY): Payer: No Typology Code available for payment source

## 2020-05-27 ENCOUNTER — Emergency Department (HOSPITAL_COMMUNITY)
Admission: EM | Admit: 2020-05-27 | Discharge: 2020-05-27 | Disposition: A | Discharge status: Home / Self Care | Payer: No Typology Code available for payment source | Attending: Emergency Medicine | Admitting: Emergency Medicine

## 2020-05-27 ENCOUNTER — Encounter (HOSPITAL_COMMUNITY): Payer: Self-pay | Admitting: Pharmacy Technician

## 2020-05-27 DIAGNOSIS — W231XXA Caught, crushed, jammed, or pinched between stationary objects, initial encounter: Secondary | ICD-10-CM | POA: Insufficient documentation

## 2020-05-27 DIAGNOSIS — S68121A Partial traumatic metacarpophalangeal amputation of left index finger, initial encounter: Secondary | ICD-10-CM | POA: Diagnosis not present

## 2020-05-27 DIAGNOSIS — S6992XA Unspecified injury of left wrist, hand and finger(s), initial encounter: Secondary | ICD-10-CM | POA: Diagnosis present

## 2020-05-27 DIAGNOSIS — Y990 Civilian activity done for income or pay: Secondary | ICD-10-CM | POA: Diagnosis not present

## 2020-05-27 DIAGNOSIS — I10 Essential (primary) hypertension: Secondary | ICD-10-CM | POA: Diagnosis not present

## 2020-05-27 DIAGNOSIS — Z23 Encounter for immunization: Secondary | ICD-10-CM | POA: Insufficient documentation

## 2020-05-27 DIAGNOSIS — S68621A Partial traumatic transphalangeal amputation of left index finger, initial encounter: Secondary | ICD-10-CM | POA: Diagnosis not present

## 2020-05-27 DIAGNOSIS — S62631A Displaced fracture of distal phalanx of left index finger, initial encounter for closed fracture: Secondary | ICD-10-CM | POA: Diagnosis not present

## 2020-05-27 DIAGNOSIS — S6990XA Unspecified injury of unspecified wrist, hand and finger(s), initial encounter: Secondary | ICD-10-CM

## 2020-05-27 DIAGNOSIS — S68119A Complete traumatic metacarpophalangeal amputation of unspecified finger, initial encounter: Secondary | ICD-10-CM

## 2020-05-27 MED ORDER — TETANUS-DIPHTH-ACELL PERTUSSIS 5-2.5-18.5 LF-MCG/0.5 IM SUSY
0.5000 mL | PREFILLED_SYRINGE | Freq: Once | INTRAMUSCULAR | Status: AC
  Administered 2020-05-27: 0.5 mL via INTRAMUSCULAR
  Filled 2020-05-27: qty 0.5

## 2020-05-27 MED ORDER — HYDROCODONE-ACETAMINOPHEN 5-325 MG PO TABS: 2.0000 | ORAL_TABLET | ORAL | 0 refills | Status: AC | PRN

## 2020-05-27 MED ORDER — CEPHALEXIN 500 MG PO CAPS: 500.0000 mg | ORAL_CAPSULE | Freq: Four times a day (QID) | ORAL | 0 refills | Status: DC

## 2020-05-27 MED ORDER — BUPIVACAINE HCL (PF) 0.5 % IJ SOLN
10.0000 mL | Freq: Once | INTRAMUSCULAR | Status: AC
  Administered 2020-05-27: 10 mL
  Filled 2020-05-27: qty 10

## 2020-05-27 MED ORDER — OXYCODONE-ACETAMINOPHEN 5-325 MG PO TABS
1.0000 | ORAL_TABLET | Freq: Once | ORAL | Status: AC
  Administered 2020-05-27: 1 via ORAL
  Filled 2020-05-27: qty 1

## 2020-05-27 NOTE — Consult Note (Signed)
Reason for Consult:Left index finger injury Referring Physician: Demaris Callander Time called: [id number] Time at bedside: Wahoo [name] is an 55 y.o. female.  HPI: Maria Logan was at work and got her left index finger caught in a pipe bending machine. She came to the ED for evaluation and hand surgery was consulted. She c/o localized pain to the area. She is LHD.  Past Medical History:  Diagnosis Date  . Fibroids   . Umbilical hernia    CT 09/09/49  . Vaginal delivery 1988, 1990, 2005    Past Surgical History:  Procedure Laterality Date  . ABDOMINAL HYSTERECTOMY N/A 12/23/2014   Procedure: HYSTERECTOMY ABDOMINAL;  Surgeon: Woodroe Mode, MD;  Location: [facility];  Service: Gynecology;  Laterality: N/A;  Requested 12/23/14 @ 1:00p  . BILATERAL SALPINGECTOMY Bilateral 12/23/2014   Procedure: BILATERAL SALPINGECTOMY;  Surgeon: Woodroe Mode, MD;  Location: [facility];  Service: Gynecology;  Laterality: Bilateral;  . EXAMINATION UNDER ANESTHESIA N/A 06/04/2014   Procedure: EXAM UNDER ANESTHESIA;  Surgeon: Guss Bunde, MD;  Location: [facility];  Service: Gynecology;  Laterality: N/A;  . OPERATIVE ULTRASOUND N/A 06/04/2014   Procedure: OPERATIVE ULTRASOUND;  Surgeon: Guss Bunde, MD;  Location: [facility];  Service: Gynecology;  Laterality: N/A;    Family History  Problem Relation Age of Onset  . Diabetes Mother   . Alcohol abuse Neg Hx   . Arthritis Neg Hx   . Asthma Neg Hx   . Birth defects Neg Hx   . Cancer Neg Hx   . COPD Neg Hx   . Depression Neg Hx   . Drug abuse Neg Hx   . Early death Neg Hx   . Hearing loss Neg Hx   . Heart disease Neg Hx   . Hyperlipidemia Neg Hx   . Hypertension Neg Hx   . Kidney disease Neg Hx   . Learning disabilities Neg Hx   . Mental illness Neg Hx   . Mental retardation Neg Hx   . Miscarriages / Stillbirths Neg Hx   . Stroke Neg Hx   . Vision loss Neg Hx   . Varicose Veins Neg Hx     Social History:  reports that she has never smoked. She has never used  smokeless tobacco. She reports that she does not drink alcohol and does not use drugs.  Allergies: No Known Allergies  Medications: I have reviewed the patient's current medications.  No results found for this or any previous visit (from the past 48 hour(s)).  No results found.  Review of Systems  HENT: Negative for ear discharge, ear pain, hearing loss and tinnitus.   Eyes: Negative for photophobia and pain.  Respiratory: Negative for cough and shortness of breath.   Cardiovascular: Negative for chest pain.  Gastrointestinal: Negative for abdominal pain, nausea and vomiting.  Genitourinary: Negative for dysuria, flank pain, frequency and urgency.  Musculoskeletal: Positive for arthralgias (Left index fingers). Negative for back pain, myalgias and neck pain.  Neurological: Negative for dizziness and headaches.  Hematological: Does not bruise/bleed easily.  Psychiatric/Behavioral: The patient is not nervous/anxious.    Last menstrual period 12/09/2014. Physical Exam Constitutional:      General: She is not in acute distress.    Appearance: She is well-developed. She is not diaphoretic.  HENT:     Head: Normocephalic and atraumatic.  Eyes:     General: No scleral icterus.       Right eye: No discharge.  Left eye: No discharge.     Conjunctiva/sclera: Conjunctivae normal.  Cardiovascular:     Rate and Rhythm: Normal rate and regular rhythm.  Pulmonary:     Effort: Pulmonary effort is normal. No respiratory distress.  Musculoskeletal:     Cervical back: Normal range of motion.     Comments: Left shoulder, elbow, wrist, digits- Index finger tip avulsion just distal to proximal nail, mild laceration to adjoining long finger, no instability, no blocks to motion  Sens  Ax/R/M/U intact  Mot   Ax/ R/ PIN/ M/ AIN/ U intact  Rad 2+  Skin:    General: Skin is warm and dry.  Neurological:     Mental Status: She is alert.  Psychiatric:        Behavior: Behavior normal.      Assessment/Plan: Left index finger injury -- EDP to I&D and dress. F/u with Dr. Caralyn Guile in office later this week. Home on keflex.    Maria Abu, PA-C Orthopedic Surgery [phone] 05/27/2020, 3:10 PM

## 2020-05-27 NOTE — Discharge Instructions (Signed)
Follow up with Dr. Caralyn Guile as instructed.

## 2020-05-27 NOTE — ED Provider Notes (Signed)
[facility] Logan   CSN: [id number] Arrival date & time: 05/27/20  1419     History No chief complaint on file.   Maria Logan is a 55 y.o. female.  55 year old female with prior medical history as detailed below presents for evaluation.  Patient reports injury to the distal tip of the left index finger.  This finger was caught in a press while at work.  She denies other injury.  She is unsure of her last tetanus.  She was already given fentanyl and Ancef by EMS prior to arrival.  The history is provided by the patient and medical records.  Hand Injury Upper extremity pain location: distal left index finger  Injury: yes   Time since incident:  30 minutes Mechanism of injury: amputation   Amputation:    Extent:  Complete   Mechanism:  Crush   Cause:  Designer, television/film set part recovered: yes     Reported condition of body part:  Intact   Preservation of body part:  Chilled Pain details:    Quality:  Aching      Past Medical History:  Diagnosis Date  . Fibroids   . Umbilical hernia    CT 9/98/33  . Vaginal delivery 1988, 1990, 2005    Patient Active Problem List   Diagnosis Date Noted  . Hypertension 02/02/2019  . S/P TAH (total abdominal hysterectomy) 12/24/2014    Past Surgical History:  Procedure Laterality Date  . ABDOMINAL HYSTERECTOMY N/A 12/23/2014   Procedure: HYSTERECTOMY ABDOMINAL;  Surgeon: Woodroe Mode, MD;  Location: [facility];  Service: Gynecology;  Laterality: N/A;  Requested 12/23/14 @ 1:00p  . BILATERAL SALPINGECTOMY Bilateral 12/23/2014   Procedure: BILATERAL SALPINGECTOMY;  Surgeon: Woodroe Mode, MD;  Location: [facility];  Service: Gynecology;  Laterality: Bilateral;  . EXAMINATION UNDER ANESTHESIA N/A 06/04/2014   Procedure: EXAM UNDER ANESTHESIA;  Surgeon: Guss Bunde, MD;  Location: [facility];  Service: Gynecology;  Laterality: N/A;  . OPERATIVE ULTRASOUND N/A 06/04/2014   Procedure:  OPERATIVE ULTRASOUND;  Surgeon: Guss Bunde, MD;  Location: [facility];  Service: Gynecology;  Laterality: N/A;     OB History    Gravida  3   Para  3   Term  3   Preterm      AB  0   Living  3     SAB  0   IAB      Ectopic      Multiple      Live Births              Family History  Problem Relation Age of Onset  . Diabetes Mother   . Alcohol abuse Neg Hx   . Arthritis Neg Hx   . Asthma Neg Hx   . Birth defects Neg Hx   . Cancer Neg Hx   . COPD Neg Hx   . Depression Neg Hx   . Drug abuse Neg Hx   . Early death Neg Hx   . Hearing loss Neg Hx   . Heart disease Neg Hx   . Hyperlipidemia Neg Hx   . Hypertension Neg Hx   . Kidney disease Neg Hx   . Learning disabilities Neg Hx   . Mental illness Neg Hx   . Mental retardation Neg Hx   . Miscarriages / Stillbirths Neg Hx   . Stroke Neg Hx   . Vision loss Neg Hx   .  Varicose Veins Neg Hx     Social History   Tobacco Use  . Smoking status: Never Smoker  . Smokeless tobacco: Never Used  Vaping Use  . Vaping Use: Never used  Substance Use Topics  . Alcohol use: No  . Drug use: No    Home Medications Prior to Admission medications   Medication Sig Start Date End Date Taking? Authorizing Provider  benzonatate (TESSALON) 100 MG capsule Take 1-2 capsules (100-200 mg total) by mouth every 8 (eight) hours as needed for cough. 12/17/19   Zigmund Gottron, NP  cephALEXin (KEFLEX) 500 MG capsule Take 1 capsule (500 mg total) by mouth 3 (three) times daily. 05/26/19   Larene Pickett, PA-C  cetirizine (ZYRTEC) 10 MG tablet Take 1 tablet (10 mg total) by mouth daily. 12/17/19   Zigmund Gottron, NP  estradiol (ESTRACE) 1 MG tablet Take 1 tablet (1 mg total) by mouth daily. Patient not taking: Reported on 05/26/2019 12/04/17   Woodroe Mode, MD  meclizine (ANTIVERT) 12.5 MG tablet Take 1 tablet (12.5 mg total) by mouth 3 (three) times daily as needed for dizziness. Patient not taking: Reported on 01/17/2019 05/10/18    Robyn Haber, MD  predniSONE (DELTASONE) 20 MG tablet Two daily with food Patient not taking: Reported on 05/26/2019 08/18/18   Robyn Haber, MD  sertraline (ZOLOFT) 50 MG tablet Take 50 mg by mouth daily.    [provider]  triamterene-hydrochlorothiazide (MAXZIDE-25) 37.5-25 MG tablet Take 1 tablet by mouth daily. Patient not taking: Reported on 05/26/2019 08/18/18   Robyn Haber, MD  Vitamin D, Ergocalciferol, (DRISDOL) 1.25 MG (50000 UNIT) CAPS capsule Take 50,000 Units by mouth every Friday.    [provider]    Allergies    Patient has no known allergies.  Review of Systems   Review of Systems  All other systems reviewed and are negative.   Physical Exam Updated Vital Signs LMP 12/09/2014 (Approximate)   Physical Exam Vitals and nursing Logan reviewed.  Constitutional:      General: She is not in acute distress.    Appearance: Normal appearance. She is well-developed.  HENT:     Head: Normocephalic and atraumatic.  Eyes:     Conjunctiva/sclera: Conjunctivae normal.     Pupils: Pupils are equal, round, and reactive to light.  Cardiovascular:     Rate and Rhythm: Normal rate and regular rhythm.     Heart sounds: Normal heart sounds.  Pulmonary:     Effort: Pulmonary effort is normal. No respiratory distress.     Breath sounds: Normal breath sounds.  Abdominal:     General: There is no distension.     Palpations: Abdomen is soft.     Tenderness: There is no abdominal tenderness.  Musculoskeletal:        General: No deformity. Normal range of motion.     Cervical back: Normal range of motion and neck supple.     Comments: Distal tip of left index finger amputated - image below   Skin:    General: Skin is warm and dry.  Neurological:     Mental Status: She is alert and oriented to person, place, and time.         ED Results / Procedures / Treatments   Labs (all labs ordered are listed, but only abnormal results are  displayed) Labs Reviewed - No data to display  EKG None  Radiology No results found.  Procedures Procedures   Medications Ordered in  ED Medications  Tdap (BOOSTRIX) injection 0.5 mL (has no administration in time range)    ED Course  I have reviewed the triage vital signs and the nursing notes.  Pertinent labs & imaging results that were available during my care of the patient were reviewed by me and considered in my medical decision making (see chart for details).    MDM Rules/Calculators/A&P                          MDM  MSE complete  Maria Logan was evaluated in Emergency Department on 05/27/2020 for the symptoms described in the history of present illness. She was evaluated in the context of the global COVID-19 pandemic, which necessitated consideration that the patient might be at risk for infection with the SARS-CoV-2 virus that causes COVID-19. Institutional protocols and algorithms that pertain to the evaluation of patients at risk for COVID-19 are in a state of rapid change based on information released by regulatory bodies including the CDC and federal and state organizations. These policies and algorithms were followed during the patient's care in the ED.   Patient presents with distal finger amputation of the left index finger.  Antibiotics already received -Ancef given by EMS.  Tetanus updated.  X-rays ordered.  [facility] is aware of case.  Patient's wound dressed. She is to FU with Dr. Apolonio Schneiders (Hand) this week for outpatient further care.   Final Clinical Impression(s) / ED Diagnoses Final diagnoses:  Amputation of tip of finger, initial encounter    Rx / DC Orders ED Discharge Orders         Ordered    HYDROcodone-acetaminophen (NORCO/VICODIN) 5-325 MG tablet  Every 4 hours PRN        05/27/20 1543    cephALEXin (KEFLEX) 500 MG capsule  4 times daily        05/27/20 1543           Valarie Merino, MD 05/28/20 2230

## 2020-05-27 NOTE — ED Triage Notes (Addendum)
Pt bib ems from work with reports of partial finger amputation from a metal press. Finger brought in on ice. Bleeding controlled. Incident occurred approx 1315 today. Given 26mcg fentanyl en route and 2g ancef. VSS with ems.

## 2020-05-28 ENCOUNTER — Telehealth: Payer: Self-pay | Admitting: *Deleted

## 2020-05-28 DIAGNOSIS — S62631B Displaced fracture of distal phalanx of left index finger, initial encounter for open fracture: Secondary | ICD-10-CM | POA: Diagnosis not present

## 2020-05-28 NOTE — Telephone Encounter (Signed)
Transition Care Management Unsuccessful Follow-up Telephone Call  Date of discharge and from where:  05/27/2020 [facility]  Attempts:  1st Attempt  Reason for unsuccessful TCM follow-up call:  Left voice message

## 2020-05-29 NOTE — Telephone Encounter (Signed)
Transition Care Management Follow-up Telephone Call  Date of discharge and from where: 05/27/2020 from [facility] ED  How have you been since you were released from the hospital? Pt stated that she is in a lot of pain right and that the hydrocodone that was rx'ed is not helping to control the pain. Pt stated that she has surgery scheduled for Monday. The pain that she is feeling is a 9/10 and it is very sharp in several places on the tip of the finger that was amputated. Pt stated that she can not deal with pain much longer and sounded like she is in a lot of pain. I did encourage pt to reach out to the surgeon for something that may be more helpful with the nerve pain that she is feeling. Pt agreed to contact them.   Any questions or concerns? No  Items Reviewed:  Did the pt receive and understand the discharge instructions provided? Yes   Medications obtained and verified? Yes   Other? No   Any new allergies since your discharge? No   Dietary orders reviewed? n/a  Do you have support at home? Yes   Functional Questionnaire: (I = Independent and D = Dependent) ADLs: I Pt is typically independent in all ADL's but is having difficulty due to the pain from the traumatic amputation that she experienced a few days ago. Patient does have support in the home and to her upcoming appt.  Bathing/Dressing- I  Meal Prep- I  Eating- I  Maintaining continence- I  Transferring/Ambulation- I  Managing Meds- I   Follow up appointments reviewed:   PCP Hospital f/u appt confirmed? No    Specialist Hospital f/u appt confirmed? Yes  Scheduled to see Ortho sugeon on 06/01/2020 @ 8am.  Are transportation arrangements needed? No   If their condition worsens, is the pt aware to call PCP or go to the Emergency Dept.? Yes  Was the patient provided with contact information for the PCP's office or ED? Yes  Was to pt encouraged to call back with questions or concerns? Yes

## 2020-06-01 DIAGNOSIS — Y999 Unspecified external cause status: Secondary | ICD-10-CM | POA: Diagnosis not present

## 2020-06-01 DIAGNOSIS — S62631B Displaced fracture of distal phalanx of left index finger, initial encounter for open fracture: Secondary | ICD-10-CM | POA: Diagnosis not present

## 2020-06-01 DIAGNOSIS — X58XXXA Exposure to other specified factors, initial encounter: Secondary | ICD-10-CM | POA: Diagnosis not present

## 2020-06-02 ENCOUNTER — Emergency Department (HOSPITAL_COMMUNITY)
Admission: EM | Admit: 2020-06-02 | Discharge: 2020-06-02 | Disposition: A | Discharge status: Home / Self Care | Payer: Medicaid Other | Attending: Emergency Medicine | Admitting: Emergency Medicine

## 2020-06-02 ENCOUNTER — Encounter (HOSPITAL_COMMUNITY): Payer: Self-pay

## 2020-06-02 DIAGNOSIS — Z79899 Other long term (current) drug therapy: Secondary | ICD-10-CM | POA: Diagnosis not present

## 2020-06-02 DIAGNOSIS — G8918 Other acute postprocedural pain: Secondary | ICD-10-CM

## 2020-06-02 DIAGNOSIS — I10 Essential (primary) hypertension: Secondary | ICD-10-CM | POA: Diagnosis not present

## 2020-06-02 LAB — CBC
HCT: 40.7 % (ref 36.0–46.0)
Hemoglobin: 12.8 g/dL (ref 12.0–15.0)
MCH: 30.1 pg (ref 26.0–34.0)
MCHC: 31.4 g/dL (ref 30.0–36.0)
MCV: 95.8 fL (ref 80.0–100.0)
Platelets: 221 10*3/uL (ref 150–400)
RBC: 4.25 MIL/uL (ref 3.87–5.11)
RDW: 11.8 % (ref 11.5–15.5)
WBC: 5.2 10*3/uL (ref 4.0–10.5)
nRBC: 0 % (ref 0.0–0.2)

## 2020-06-02 LAB — BASIC METABOLIC PANEL
Anion gap: 7 (ref 5–15)
BUN: 12 mg/dL (ref 6–20)
CO2: 28 mmol/L (ref 22–32)
Calcium: 9.2 mg/dL (ref 8.9–10.3)
Chloride: 103 mmol/L (ref 98–111)
Creatinine, Ser: 1.04 mg/dL — ABNORMAL HIGH (ref 0.44–1.00)
GFR, Estimated: >60 mL/min (ref >60)
Glucose, Bld: 94 mg/dL (ref 70–99)
Potassium: 4.3 mmol/L (ref 3.5–5.1)
Sodium: 138 mmol/L (ref 135–145)

## 2020-06-02 MED ORDER — BUPIVACAINE HCL (PF) 0.5 % IJ SOLN
10.0000 mL | Freq: Once | INTRAMUSCULAR | Status: DC
  Filled 2020-06-02: qty 10

## 2020-06-02 MED ORDER — GABAPENTIN 100 MG PO CAPS
100.0000 mg | ORAL_CAPSULE | Freq: Three times a day (TID) | ORAL | 0 refills | Status: AC
Start: 2020-06-02 — End: ?

## 2020-06-02 MED ORDER — BUPIVACAINE HCL 0.25 % IJ SOLN: 10.0000 mL | Freq: Once | INTRAMUSCULAR | Status: DC

## 2020-06-02 MED ORDER — IBUPROFEN 800 MG PO TABS
800.0000 mg | ORAL_TABLET | Freq: Once | ORAL | Status: AC
  Administered 2020-06-02: 800 mg via ORAL
  Filled 2020-06-02: qty 1

## 2020-06-02 MED ORDER — BUPIVACAINE HCL (PF) 0.25 % IJ SOLN
10.0000 mL | Freq: Once | INTRAMUSCULAR | Status: AC
  Administered 2020-06-02: 10 mL

## 2020-06-02 MED ORDER — OXYCODONE-ACETAMINOPHEN 5-325 MG PO TABS
2.0000 | ORAL_TABLET | Freq: Once | ORAL | Status: AC
  Administered 2020-06-02: 2 via ORAL
  Filled 2020-06-02: qty 2

## 2020-06-02 NOTE — ED Notes (Signed)
Patient verbalizes understanding of discharge instructions. Opportunity for questioning and answers were provided. Armband removed by staff, pt discharged from ED via wheelchair to lobby to away family to go home.

## 2020-06-02 NOTE — Progress Notes (Signed)
Patient ID: Maria Logan, female   DOB: 1965-08-06, 55 y.o.   MRN: [id number]   LOS: 0 days   Subjective: Maria Logan returns to the ED early this morning with uncontrolled pain in her finger after revision amputation yesterday by Dr. Caralyn Guile. She describes the pain as a pins and needles sensation accompanied by throbbing. She took her Vicodin 10mg  and, when not controlled, took another one 47m-1h later with only minimal relief.   Objective: Vital signs in last 24 hours: Temp:  [97.7 F (36.5 C)] 97.7 F (36.5 C) (05/24 0654) Pulse Rate:  [57-65] 57 (05/24 0859) Resp:  [17-18] 18 (05/24 0859) BP: (107-130)/(70-115) 107/70 (05/24 0859) SpO2:  [98 %-99 %] 99 % (05/24 0859)     Laboratory  CBC Recent Labs    06/02/20 0714  WBC 5.2  HGB 12.8  HCT 40.7  PLT 221   BMET Recent Labs    06/02/20 0714  NA 138  K 4.3  CL 103  CO2 28  GLUCOSE 94  BUN 12  CREATININE 1.04*  CALCIUM 9.2     Physical Exam General appearance: alert and no distress  Right hand: Revision appears as expected, no signs of necrosis or infection.   Assessment/Plan: S/p revision amputation -- Recommended digital block with Marcaine to give her some hours of relief. EDPA going to change to Percocet as she has gotten some relief with that in ED. Instructed her to call Dr. Angus Palms office today if these changes don't help.   Lisette Abu, PA-C Orthopedic Surgery [phone] 06/02/2020

## 2020-06-02 NOTE — ED Triage Notes (Signed)
Pt reports amputation of finger yesterday, took home pain meds and some extra and pain is out of control

## 2020-06-02 NOTE — ED Provider Notes (Signed)
[facility] EMERGENCY DEPARTMENT Provider Note   CSN: [id number] Arrival date & time: 06/02/20  4268     History Chief Complaint  Patient presents with  . Post-op Problem    Maria Logan is a 55 y.o. female who is s/p amputation of the Tip of her L index finger on 05/27/2020. [facility] Logan was seen yesterday By Dr. Caralyn Guile and had a revision surgery in his office. Maria Logan was di/c with Norco. Maria Logan said that today her pain is unbearable. Maria Logan describes pins and needles. Maria Logan took 2 Norco (Maria Logan is only prescribed 1) without symptom relief. Maria Logan has no other complaints.  HPI     Past Medical History:  Diagnosis Date  . Fibroids   . Umbilical hernia    CT 3/41/96  . Vaginal delivery 1988, 1990, 2005    Patient Active Problem List   Diagnosis Date Noted  . Hypertension 02/02/2019  . S/P TAH (total abdominal hysterectomy) 12/24/2014    Past Surgical History:  Procedure Laterality Date  . ABDOMINAL HYSTERECTOMY N/A 12/23/2014   Procedure: HYSTERECTOMY ABDOMINAL;  Surgeon: Woodroe Mode, MD;  Location: [facility];  Service: Gynecology;  Laterality: N/A;  Requested 12/23/14 @ 1:00p  . BILATERAL SALPINGECTOMY Bilateral 12/23/2014   Procedure: BILATERAL SALPINGECTOMY;  Surgeon: Woodroe Mode, MD;  Location: [facility];  Service: Gynecology;  Laterality: Bilateral;  . EXAMINATION UNDER ANESTHESIA N/A 06/04/2014   Procedure: EXAM UNDER ANESTHESIA;  Surgeon: Guss Bunde, MD;  Location: [facility];  Service: Gynecology;  Laterality: N/A;  . OPERATIVE ULTRASOUND N/A 06/04/2014   Procedure: OPERATIVE ULTRASOUND;  Surgeon: Guss Bunde, MD;  Location: [facility];  Service: Gynecology;  Laterality: N/A;     OB History    Gravida  3   Para  3   Term  3   Preterm      AB  0   Living  3     SAB  0   IAB      Ectopic      Multiple      Live Births              Family History  Problem Relation Age of Onset  . Diabetes Mother   . Alcohol abuse Neg Hx   . Arthritis Neg Hx    . Asthma Neg Hx   . Birth defects Neg Hx   . Cancer Neg Hx   . COPD Neg Hx   . Depression Neg Hx   . Drug abuse Neg Hx   . Early death Neg Hx   . Hearing loss Neg Hx   . Heart disease Neg Hx   . Hyperlipidemia Neg Hx   . Hypertension Neg Hx   . Kidney disease Neg Hx   . Learning disabilities Neg Hx   . Mental illness Neg Hx   . Mental retardation Neg Hx   . Miscarriages / Stillbirths Neg Hx   . Stroke Neg Hx   . Vision loss Neg Hx   . Varicose Veins Neg Hx     Social History   Tobacco Use  . Smoking status: Never Smoker  . Smokeless tobacco: Never Used  Vaping Use  . Vaping Use: Never used  Substance Use Topics  . Alcohol use: No  . Drug use: No    Home Medications Prior to Admission medications   Medication Sig Start Date End Date Taking? Authorizing Provider  benzonatate (TESSALON) 100 MG capsule Take 1-2 capsules (100-200 mg total)  by mouth every 8 (eight) hours as needed for cough. 12/17/19   Zigmund Gottron, NP  cephALEXin (KEFLEX) 500 MG capsule Take 1 capsule (500 mg total) by mouth 3 (three) times daily. 05/26/19   Larene Pickett, PA-C  cephALEXin (KEFLEX) 500 MG capsule Take 1 capsule (500 mg total) by mouth 4 (four) times daily. 05/27/20   Valarie Merino, MD  cetirizine (ZYRTEC) 10 MG tablet Take 1 tablet (10 mg total) by mouth daily. 12/17/19   Zigmund Gottron, NP  estradiol (ESTRACE) 1 MG tablet Take 1 tablet (1 mg total) by mouth daily. Patient not taking: Reported on 05/26/2019 12/04/17   Woodroe Mode, MD  HYDROcodone-acetaminophen (NORCO/VICODIN) 5-325 MG tablet Take 2 tablets by mouth every 4 (four) hours as needed. 05/27/20   Valarie Merino, MD  meclizine (ANTIVERT) 12.5 MG tablet Take 1 tablet (12.5 mg total) by mouth 3 (three) times daily as needed for dizziness. Patient not taking: Reported on 01/17/2019 05/10/18   Robyn Haber, MD  predniSONE (DELTASONE) 20 MG tablet Two daily with food Patient not taking: Reported on 05/26/2019 08/18/18    Robyn Haber, MD  sertraline (ZOLOFT) 50 MG tablet Take 50 mg by mouth daily.    [provider]  triamterene-hydrochlorothiazide (MAXZIDE-25) 37.5-25 MG tablet Take 1 tablet by mouth daily. Patient not taking: Reported on 05/26/2019 08/18/18   Robyn Haber, MD  Vitamin D, Ergocalciferol, (DRISDOL) 1.25 MG (50000 UNIT) CAPS capsule Take 50,000 Units by mouth every Friday.    [provider]    Allergies    Patient has no known allergies.  Review of Systems   Review of Systems Ten systems reviewed and are negative for acute change, except as noted in the HPI.   Physical Exam Updated Vital Signs BP (!) 130/115 (BP Location: Right Arm)   Pulse 65   Temp 97.7 F (36.5 C) (Oral)   Resp 17   LMP 12/09/2014 (Approximate)   SpO2 98%   Physical Exam Vitals and nursing note reviewed.  Constitutional:      General: [facility] is not in acute distress.    Appearance: [facility] is well-developed. [facility] is not diaphoretic.  HENT:     Head: Normocephalic and atraumatic.  Eyes:     General: No scleral icterus.    Conjunctiva/sclera: Conjunctivae normal.  Cardiovascular:     Rate and Rhythm: Normal rate and regular rhythm.     Heart sounds: Normal heart sounds. No murmur heard. No friction rub. No gallop.   Pulmonary:     Effort: Pulmonary effort is normal. No respiratory distress.     Breath sounds: Normal breath sounds.  Abdominal:     General: Bowel sounds are normal. There is no distension.     Palpations: Abdomen is soft. There is no mass.     Tenderness: There is no abdominal tenderness. There is no guarding.  Musculoskeletal:     Cervical back: Normal range of motion.     Comments: R index finger sutures in tact. No obvious signs of infection.   Skin:    General: Skin is warm and dry.  Neurological:     Mental Status: [facility] is alert and oriented to person, place, and time.  Psychiatric:        Behavior: Behavior normal.     ED Results / Procedures / Treatments    Labs (all labs ordered are listed, but only abnormal results are displayed) Labs Reviewed  BASIC METABOLIC PANEL - Abnormal; Notable for  the following components:      Result Value   Creatinine, Ser 1.04 (*)    All other components within normal limits  CBC    EKG None  Radiology No results found.  Procedures .Nerve Block  Date/Time: 06/02/2020 9:49 AM Performed by: Margarita Mail, PA-C Authorized by: Margarita Mail, PA-C   Consent:    Consent obtained:  Verbal   Consent given by:  Patient   Risks, benefits, and alternatives were discussed: yes     Risks discussed:  Swelling, unsuccessful block, pain and bleeding   Alternatives discussed:  No treatment Universal protocol:    Patient identity confirmed:  Provided demographic data Indications:    Indications:  Pain relief Location:    Body area:  Upper extremity   Upper extremity nerve:  Metacarpal   Laterality:  Left Pre-procedure details:    Skin preparation:  Povidone-iodine Procedure details:    Block needle gauge:  27 G   Anesthetic injected:  Bupivacaine 0.5% w/o epi   Steroid injected:  None   Additive injected:  None   Injection procedure:  Anatomic landmarks identified   Paresthesia:  None Post-procedure details:    Dressing:  Sterile dressing   Outcome:  Anesthesia achieved   Procedure completion:  Tolerated well, no immediate complications     Medications Ordered in ED Medications  bupivacaine (MARCAINE) 0.25 % (with pres) injection 10 mL (has no administration in time range)  oxyCODONE-acetaminophen (PERCOCET/ROXICET) 5-325 MG per tablet 2 tablet (2 tablets Oral Given 06/02/20 0755)  ibuprofen (ADVIL) tablet 800 mg (800 mg Oral Given 06/02/20 0757)    ED Course  I have reviewed the triage vital signs and the nursing notes.  Pertinent labs & imaging results that were available during my care of the patient were reviewed by me and considered in my medical decision making (see chart for  details).  Clinical Course as of 06/02/20 0944  Tue Jun 02, 2020  0854 Patient seen by PA Jacqulynn Cadet- recommends Marcaine block and increased pain control. [AH]    Clinical Course User Index [AH] Margarita Mail, PA-C   MDM Rules/Calculators/A&P                         Patient here with POSt Op pain to left index finger after amputation revision surgery in the office with Dr. Apolonio Schneiders yesterday.  I gave her a digital block and [facility] has complete anesthesia at this time.  Seen by [facility] who recommends calling in some gabapentin.  Patient's pain is well controlled at this time.  [facility] appears otherwise appropriate for discharge.  [facility] Logan is advised to call Dr. Lequita Asal office today.  Final Clinical Impression(s) / ED Diagnoses Final diagnoses:  None    Rx / DC Orders ED Discharge Orders    None       Margarita Mail, PA-C 06/02/20 [place], MD 06/03/20 [phone]

## 2020-06-03 ENCOUNTER — Telehealth: Payer: Self-pay | Admitting: *Deleted

## 2020-06-03 NOTE — Telephone Encounter (Signed)
Transition Care Management Follow-up Telephone Call  Date of discharge and from where: 06/02/2020 [facility]  How have you been since you were released from the hospital? "Still in a lot of pain, nothing is helping" Advised her that she needs to call her surgeon's office and make them aware. She verbalized understanding. Any questions or concerns? Yes  - still in a lot of pain  Items Reviewed:  Did the pt receive and understand the discharge instructions provided? Yes   Medications obtained and verified? Yes   Other? No   Any new allergies since your discharge? No   Dietary orders reviewed? No  Do you have support at home? Yes     Functional Questionnaire: (I = Independent and D = Dependent) ADLs: I  Bathing/Dressing- I  Meal Prep- I  Eating- I  Maintaining continence- I  Transferring/Ambulation- I  Managing Meds- I  Follow up appointments reviewed:   PCP Hospital f/u appt confirmed? No    Specialist Hospital f/u appt confirmed? No    Are transportation arrangements needed? No   If their condition worsens, is the pt aware to call PCP or go to the Emergency Dept.? Yes  Was the patient provided with contact information for the PCP's office or ED? Yes  Was to pt encouraged to call back with questions or concerns? Yes

## 2020-06-09 DIAGNOSIS — F430 Acute stress reaction: Secondary | ICD-10-CM | POA: Diagnosis not present

## 2020-06-09 DIAGNOSIS — S62631B Displaced fracture of distal phalanx of left index finger, initial encounter for open fracture: Secondary | ICD-10-CM | POA: Diagnosis not present

## 2020-06-09 DIAGNOSIS — S68110D Complete traumatic metacarpophalangeal amputation of right index finger, subsequent encounter: Secondary | ICD-10-CM | POA: Diagnosis not present

## 2020-06-09 DIAGNOSIS — Z4789 Encounter for other orthopedic aftercare: Secondary | ICD-10-CM | POA: Diagnosis not present

## 2020-06-19 DIAGNOSIS — F430 Acute stress reaction: Secondary | ICD-10-CM | POA: Diagnosis not present

## 2020-06-19 DIAGNOSIS — S68110D Complete traumatic metacarpophalangeal amputation of right index finger, subsequent encounter: Secondary | ICD-10-CM | POA: Diagnosis not present

## 2020-07-20 DIAGNOSIS — F430 Acute stress reaction: Secondary | ICD-10-CM | POA: Diagnosis not present

## 2020-07-20 DIAGNOSIS — S68111A Complete traumatic metacarpophalangeal amputation of left index finger, initial encounter: Secondary | ICD-10-CM | POA: Diagnosis not present

## 2020-08-04 DIAGNOSIS — F4310 Post-traumatic stress disorder, unspecified: Secondary | ICD-10-CM | POA: Diagnosis not present

## 2020-08-10 DIAGNOSIS — F4310 Post-traumatic stress disorder, unspecified: Secondary | ICD-10-CM | POA: Diagnosis not present

## 2020-08-11 ENCOUNTER — Other Ambulatory Visit: Payer: Self-pay | Admitting: Internal Medicine

## 2020-08-11 DIAGNOSIS — Z131 Encounter for screening for diabetes mellitus: Secondary | ICD-10-CM | POA: Diagnosis not present

## 2020-08-11 DIAGNOSIS — Z13220 Encounter for screening for lipoid disorders: Secondary | ICD-10-CM | POA: Diagnosis not present

## 2020-08-11 DIAGNOSIS — E559 Vitamin D deficiency, unspecified: Secondary | ICD-10-CM | POA: Diagnosis not present

## 2020-08-11 DIAGNOSIS — F430 Acute stress reaction: Secondary | ICD-10-CM | POA: Diagnosis not present

## 2020-08-11 DIAGNOSIS — S68111D Complete traumatic metacarpophalangeal amputation of left index finger, subsequent encounter: Secondary | ICD-10-CM | POA: Diagnosis not present

## 2020-08-11 DIAGNOSIS — Z0000 Encounter for general adult medical examination without abnormal findings: Secondary | ICD-10-CM | POA: Diagnosis not present

## 2020-08-12 DIAGNOSIS — F4310 Post-traumatic stress disorder, unspecified: Secondary | ICD-10-CM | POA: Diagnosis not present

## 2020-08-12 LAB — CBC
HCT: 35.5 % (ref 35.0–45.0)
Hemoglobin: 11.7 g/dL (ref 11.7–15.5)
MCH: 29.8 pg (ref 27.0–33.0)
MCHC: 33 g/dL (ref 32.0–36.0)
MCV: 90.6 fL (ref 80.0–100.0)
MPV: 10.3 fL (ref 7.5–12.5)
Platelets: 211 10*3/uL (ref 140–400)
RBC: 3.92 10*6/uL (ref 3.80–5.10)
RDW: 11.8 % (ref 11.0–15.0)
WBC: 5.6 10*3/uL (ref 3.8–10.8)

## 2020-08-12 LAB — COMPLETE METABOLIC PANEL WITH GFR
AG Ratio: 1.3 (calc) (ref 1.0–2.5)
ALT: 12 U/L (ref 6–29)
AST: 16 U/L (ref 10–35)
Albumin: 3.7 g/dL (ref 3.6–5.1)
Alkaline phosphatase (APISO): 61 U/L (ref 37–153)
BUN: 12 mg/dL (ref 7–25)
CO2: 24 mmol/L (ref 20–32)
Calcium: 8.8 mg/dL (ref 8.6–10.4)
Chloride: 107 mmol/L (ref 98–110)
Creat: 0.9 mg/dL (ref 0.50–1.03)
Globulin: 2.9 g/dL (calc) (ref 1.9–3.7)
Glucose, Bld: 113 mg/dL — ABNORMAL HIGH (ref 65–99)
Potassium: 3.7 mmol/L (ref 3.5–5.3)
Sodium: 139 mmol/L (ref 135–146)
Total Bilirubin: 0.3 mg/dL (ref 0.2–1.2)
Total Protein: 6.6 g/dL (ref 6.1–8.1)
eGFR: 77 mL/min/{1.73_m2} (ref >=60)

## 2020-08-12 LAB — LIPID PANEL
Cholesterol: 204 mg/dL — ABNORMAL HIGH (ref <200)
HDL: 89 mg/dL (ref >=50)
LDL Cholesterol (Calc): 100 mg/dL (calc) — ABNORMAL HIGH
Non-HDL Cholesterol (Calc): 115 mg/dL (calc) (ref <130)
Total CHOL/HDL Ratio: 2.3 (calc) (ref <5.0)
Triglycerides: 60 mg/dL (ref <150)

## 2020-08-12 LAB — VITAMIN D 25 HYDROXY (VIT D DEFICIENCY, FRACTURES): Vit D, 25-Hydroxy: 29 ng/mL — ABNORMAL LOW (ref 30–100)

## 2020-08-12 LAB — TSH: TSH: 1.52 mIU/L

## 2020-08-17 DIAGNOSIS — F4310 Post-traumatic stress disorder, unspecified: Secondary | ICD-10-CM | POA: Diagnosis not present

## 2020-08-19 DIAGNOSIS — F4310 Post-traumatic stress disorder, unspecified: Secondary | ICD-10-CM | POA: Diagnosis not present

## 2020-08-24 DIAGNOSIS — F4310 Post-traumatic stress disorder, unspecified: Secondary | ICD-10-CM | POA: Diagnosis not present

## 2020-08-25 DIAGNOSIS — F430 Acute stress reaction: Secondary | ICD-10-CM | POA: Diagnosis not present

## 2020-08-25 DIAGNOSIS — S68111D Complete traumatic metacarpophalangeal amputation of left index finger, subsequent encounter: Secondary | ICD-10-CM | POA: Diagnosis not present

## 2020-08-26 DIAGNOSIS — F4310 Post-traumatic stress disorder, unspecified: Secondary | ICD-10-CM | POA: Diagnosis not present

## 2020-08-31 DIAGNOSIS — F4310 Post-traumatic stress disorder, unspecified: Secondary | ICD-10-CM | POA: Diagnosis not present

## 2020-09-02 DIAGNOSIS — F4310 Post-traumatic stress disorder, unspecified: Secondary | ICD-10-CM | POA: Diagnosis not present

## 2020-11-24 DIAGNOSIS — F4310 Post-traumatic stress disorder, unspecified: Secondary | ICD-10-CM | POA: Diagnosis not present

## 2020-12-01 DIAGNOSIS — F4310 Post-traumatic stress disorder, unspecified: Secondary | ICD-10-CM | POA: Diagnosis not present

## 2020-12-08 DIAGNOSIS — F4310 Post-traumatic stress disorder, unspecified: Secondary | ICD-10-CM | POA: Diagnosis not present

## 2020-12-15 DIAGNOSIS — F4310 Post-traumatic stress disorder, unspecified: Secondary | ICD-10-CM | POA: Diagnosis not present

## 2020-12-22 DIAGNOSIS — F4310 Post-traumatic stress disorder, unspecified: Secondary | ICD-10-CM | POA: Diagnosis not present

## 2020-12-29 DIAGNOSIS — F4310 Post-traumatic stress disorder, unspecified: Secondary | ICD-10-CM | POA: Diagnosis not present

## 2021-01-06 DIAGNOSIS — R072 Precordial pain: Secondary | ICD-10-CM | POA: Diagnosis not present

## 2021-01-06 DIAGNOSIS — Z6831 Body mass index (BMI) 31.0-31.9, adult: Secondary | ICD-10-CM | POA: Diagnosis not present

## 2021-01-06 DIAGNOSIS — Z1211 Encounter for screening for malignant neoplasm of colon: Secondary | ICD-10-CM | POA: Diagnosis not present

## 2021-01-06 DIAGNOSIS — R9431 Abnormal electrocardiogram [ECG] [EKG]: Secondary | ICD-10-CM | POA: Diagnosis not present

## 2021-01-08 ENCOUNTER — Encounter (HOSPITAL_COMMUNITY): Payer: Self-pay | Admitting: Emergency Medicine

## 2021-01-08 ENCOUNTER — Emergency Department (HOSPITAL_COMMUNITY)
Admission: EM | Admit: 2021-01-08 | Discharge: 2021-01-08 | Disposition: A | Discharge status: Home / Self Care | Payer: Medicaid Other | Attending: Emergency Medicine | Admitting: Emergency Medicine

## 2021-01-08 ENCOUNTER — Emergency Department (HOSPITAL_COMMUNITY): Payer: Medicaid Other

## 2021-01-08 DIAGNOSIS — I10 Essential (primary) hypertension: Secondary | ICD-10-CM | POA: Diagnosis not present

## 2021-01-08 DIAGNOSIS — R0789 Other chest pain: Secondary | ICD-10-CM | POA: Diagnosis not present

## 2021-01-08 DIAGNOSIS — M79662 Pain in left lower leg: Secondary | ICD-10-CM | POA: Diagnosis not present

## 2021-01-08 DIAGNOSIS — M791 Myalgia, unspecified site: Secondary | ICD-10-CM

## 2021-01-08 DIAGNOSIS — M79605 Pain in left leg: Secondary | ICD-10-CM | POA: Diagnosis not present

## 2021-01-08 DIAGNOSIS — R079 Chest pain, unspecified: Secondary | ICD-10-CM | POA: Diagnosis not present

## 2021-01-08 LAB — CBC
HCT: 39.9 % (ref 36.0–46.0)
Hemoglobin: 12.9 g/dL (ref 12.0–15.0)
MCH: 30.3 pg (ref 26.0–34.0)
MCHC: 32.3 g/dL (ref 30.0–36.0)
MCV: 93.7 fL (ref 80.0–100.0)
Platelets: 222 10*3/uL (ref 150–400)
RBC: 4.26 MIL/uL (ref 3.87–5.11)
RDW: 12.3 % (ref 11.5–15.5)
WBC: 6.9 10*3/uL (ref 4.0–10.5)
nRBC: 0 % (ref 0.0–0.2)

## 2021-01-08 LAB — URINALYSIS, ROUTINE W REFLEX MICROSCOPIC
Bilirubin Urine: NEGATIVE
Glucose, UA: NEGATIVE mg/dL
Ketones, ur: 5 mg/dL — AB
Leukocytes,Ua: NEGATIVE
Nitrite: NEGATIVE
Protein, ur: NEGATIVE mg/dL
Specific Gravity, Urine: 1.016 (ref 1.005–1.030)
pH: 5 (ref 5.0–8.0)

## 2021-01-08 LAB — BASIC METABOLIC PANEL
Anion gap: 10 (ref 5–15)
BUN: 15 mg/dL (ref 6–20)
CO2: 25 mmol/L (ref 22–32)
Calcium: 9.1 mg/dL (ref 8.9–10.3)
Chloride: 104 mmol/L (ref 98–111)
Creatinine, Ser: 0.93 mg/dL (ref 0.44–1.00)
GFR, Estimated: >60 mL/min (ref >60)
Glucose, Bld: 102 mg/dL — ABNORMAL HIGH (ref 70–99)
Potassium: 4.4 mmol/L (ref 3.5–5.1)
Sodium: 139 mmol/L (ref 135–145)

## 2021-01-08 LAB — I-STAT BETA HCG BLOOD, ED (MC, WL, AP ONLY): I-stat hCG, quantitative: <5 m[IU]/mL (ref <5)

## 2021-01-08 LAB — D-DIMER, QUANTITATIVE: D-Dimer, Quant: 0.41 ug/mL-FEU (ref 0.00–0.50)

## 2021-01-08 LAB — TROPONIN I (HIGH SENSITIVITY): Troponin I (High Sensitivity): <2 ng/L (ref <18)

## 2021-01-08 NOTE — ED Provider Notes (Signed)
Emergency Medicine Provider Triage Evaluation Note  [facility] , a 55 y.o. female  was evaluated in triage.  Pt complains of central chest pain that she describes as heaviness or pushing.  Nonradiating.  Associated with "stiffness" on her left arm.  She has had intermittent shortness of breath.  She states the chest pain has progressed over the past week and "accelerated" over the past 2 days.  She denies any nausea.  She has endorsed hot flashes.  She also states that she has had mild swelling to her left upper leg.  Denies recent travel, hemoptysis, surgery, malignancy, estrogen use, smoking.  Denies syncope, lightheadedness or dizziness.  Review of Systems  Positive: See above Negative:  Physical Exam  BP 104/71 (BP Location: Right Arm)    Pulse 69    Temp 98 F (36.7 C) (Oral)    Resp 18    LMP 12/09/2014 (Approximate)    SpO2 100%  Gen:   Awake, no distress   Resp:  Normal effort, lungs clear to auscultation bilaterally MSK:   Moves extremities without difficulty  Other:  S1/S2 without murmur.  I am unable to appreciate any significant unilateral swelling to the left leg.  Medical Decision Making  Medically screening exam initiated at 1:23 PM.  Appropriate orders placed.  [facility] was informed that the remainder of the evaluation will be completed by another provider, this initial triage assessment does not replace that evaluation, and the importance of remaining in the ED until their evaluation is complete.     Mickie Hillier, PA-C 01/08/21 1325    Valarie Merino, MD 01/08/21 [phone]

## 2021-01-08 NOTE — ED Triage Notes (Signed)
Patient here from home reporting central chest pressure x1 week. Denies n/v.

## 2021-01-08 NOTE — Discharge Instructions (Signed)
There were no serious problems found to explain your discomforts tonight.  Follow-up with your primary care doctor for further care and treatment as needed.  Use Tylenol for pain.

## 2021-01-08 NOTE — ED Provider Notes (Signed)
[facility] DEPT Provider Note   CSN: [id number] Arrival date & time: 01/08/21  1305     History Chief Complaint  Patient presents with   Chest Pain    Maria Logan is a 55 y.o. female.  HPI She presents for evaluation of nonspecific symptoms including chest pain, left leg swelling, and feeling dehydrated.  She states she drinks tea and coffee and tries her regular liquids during the day but never seems to get enough liquids in.  She is taking her usual prescribed medications.  She denies travel, trauma, fever, chills, nausea or vomiting.  She has not had any changes in her bowel and urinary habits or symptoms.  She wonders if she has a sinus infection however she denies rhinorrhea, sneezing, coughing or shortness of breath.    Past Medical History:  Diagnosis Date   Fibroids    Umbilical hernia    CT 2/70/35   Vaginal delivery 1988, 1990, 2005    Patient Active Problem List   Diagnosis Date Noted   Hypertension 02/02/2019   S/P TAH (total abdominal hysterectomy) 12/24/2014    Past Surgical History:  Procedure Laterality Date   ABDOMINAL HYSTERECTOMY N/A 12/23/2014   Procedure: HYSTERECTOMY ABDOMINAL;  Surgeon: Woodroe Mode, MD;  Location: [facility];  Service: Gynecology;  Laterality: N/A;  Requested 12/23/14 @ 1:00p   BILATERAL SALPINGECTOMY Bilateral 12/23/2014   Procedure: BILATERAL SALPINGECTOMY;  Surgeon: Woodroe Mode, MD;  Location: [facility];  Service: Gynecology;  Laterality: Bilateral;   EXAMINATION UNDER ANESTHESIA N/A 06/04/2014   Procedure: EXAM UNDER ANESTHESIA;  Surgeon: Guss Bunde, MD;  Location: [facility];  Service: Gynecology;  Laterality: N/A;   OPERATIVE ULTRASOUND N/A 06/04/2014   Procedure: OPERATIVE ULTRASOUND;  Surgeon: Guss Bunde, MD;  Location: [facility];  Service: Gynecology;  Laterality: N/A;     OB History     Gravida  3   Para  3   Term  3   Preterm      AB  0   Living  3      SAB  0   IAB       Ectopic      Multiple      Live Births              Family History  Problem Relation Age of Onset   Diabetes Mother    Alcohol abuse Neg Hx    Arthritis Neg Hx    Asthma Neg Hx    Birth defects Neg Hx    Cancer Neg Hx    COPD Neg Hx    Depression Neg Hx    Drug abuse Neg Hx    Early death Neg Hx    Hearing loss Neg Hx    Heart disease Neg Hx    Hyperlipidemia Neg Hx    Hypertension Neg Hx    Kidney disease Neg Hx    Learning disabilities Neg Hx    Mental illness Neg Hx    Mental retardation Neg Hx    Miscarriages / Stillbirths Neg Hx    Stroke Neg Hx    Vision loss Neg Hx    Varicose Veins Neg Hx     Social History   Tobacco Use   Smoking status: Never   Smokeless tobacco: Never  Vaping Use   Vaping Use: Never used  Substance Use Topics   Alcohol use: No   Drug use: No    Home Medications Prior  to Admission medications   Medication Sig Start Date End Date Taking? Authorizing Provider  benzonatate (TESSALON) 100 MG capsule Take 1-2 capsules (100-200 mg total) by mouth every 8 (eight) hours as needed for cough. 12/17/19   Zigmund Gottron, NP  cephALEXin (KEFLEX) 500 MG capsule Take 1 capsule (500 mg total) by mouth 3 (three) times daily. 05/26/19   Larene Pickett, PA-C  cephALEXin (KEFLEX) 500 MG capsule Take 1 capsule (500 mg total) by mouth 4 (four) times daily. 05/27/20   Valarie Merino, MD  cetirizine (ZYRTEC) 10 MG tablet Take 1 tablet (10 mg total) by mouth daily. 12/17/19   Zigmund Gottron, NP  estradiol (ESTRACE) 1 MG tablet Take 1 tablet (1 mg total) by mouth daily. Patient not taking: Reported on 05/26/2019 12/04/17   Woodroe Mode, MD  gabapentin (NEURONTIN) 100 MG capsule Take 1 capsule (100 mg total) by mouth 3 (three) times daily. 06/02/20   Margarita Mail, PA-C  HYDROcodone-acetaminophen (NORCO/VICODIN) 5-325 MG tablet Take 2 tablets by mouth every 4 (four) hours as needed. 05/27/20   Valarie Merino, MD  meclizine (ANTIVERT) 12.5 MG  tablet Take 1 tablet (12.5 mg total) by mouth 3 (three) times daily as needed for dizziness. Patient not taking: Reported on 01/17/2019 05/10/18   Robyn Haber, MD  predniSONE (DELTASONE) 20 MG tablet Two daily with food Patient not taking: Reported on 05/26/2019 08/18/18   Robyn Haber, MD  sertraline (ZOLOFT) 50 MG tablet Take 50 mg by mouth daily.    [provider]  triamterene-hydrochlorothiazide (MAXZIDE-25) 37.5-25 MG tablet Take 1 tablet by mouth daily. Patient not taking: Reported on 05/26/2019 08/18/18   Robyn Haber, MD  Vitamin D, Ergocalciferol, (DRISDOL) 1.25 MG (50000 UNIT) CAPS capsule Take 50,000 Units by mouth every Friday.    [provider]    Allergies    Patient has no known allergies.  Review of Systems   Review of Systems  All other systems reviewed and are negative.  Physical Exam Updated Vital Signs BP 110/88 (BP Location: Right Arm)    Pulse 70    Temp 98 F (36.7 C) (Oral)    Resp 16    LMP 12/09/2014 (Approximate)    SpO2 98%   Physical Exam Vitals and nursing note reviewed.  Constitutional:      General: She is not in acute distress.    Appearance: She is well-developed. She is not ill-appearing, toxic-appearing or diaphoretic.  HENT:     Head: Normocephalic and atraumatic.     Right Ear: External ear normal.     Left Ear: External ear normal.     Nose: No congestion or rhinorrhea.  Eyes:     Extraocular Movements: Extraocular movements intact.     Conjunctiva/sclera: Conjunctivae normal.     Pupils: Pupils are equal, round, and reactive to light.  Neck:     Trachea: Phonation normal.  Cardiovascular:     Rate and Rhythm: Normal rate and regular rhythm.     Heart sounds: Normal heart sounds.  Pulmonary:     Effort: Pulmonary effort is normal.     Breath sounds: Normal breath sounds.  Abdominal:     General: There is no distension.     Palpations: Abdomen is soft.     Tenderness: There is no abdominal tenderness.   Musculoskeletal:        General: Normal range of motion.     Cervical back: Normal range of motion and neck supple.  Comments: Very mild swelling of the left lower leg and left foot, as compared to the right.  She guards against movement of the left knee against resistance but has fair range of motion of the large joints of both legs and arms  Skin:    General: Skin is warm and dry.  Neurological:     Mental Status: She is alert and oriented to person, place, and time.     Cranial Nerves: No cranial nerve deficit.     Sensory: No sensory deficit.     Motor: No abnormal muscle tone.     Coordination: Coordination normal.  Psychiatric:        Mood and Affect: Mood normal.        Behavior: Behavior normal.        Thought Content: Thought content normal.        Judgment: Judgment normal.    ED Results / Procedures / Treatments   Labs (all labs ordered are listed, but only abnormal results are displayed) Labs Reviewed  BASIC METABOLIC PANEL - Abnormal; Notable for the following components:      Result Value   Glucose, Bld 102 (*)    All other components within normal limits  URINALYSIS, ROUTINE W REFLEX MICROSCOPIC - Abnormal; Notable for the following components:   Hgb urine dipstick LARGE (*)    Ketones, ur 5 (*)    Bacteria, UA RARE (*)    All other components within normal limits  CBC  D-DIMER, QUANTITATIVE  I-STAT BETA HCG BLOOD, ED ([facility], [facility], [facility] ONLY)  TROPONIN I (HIGH SENSITIVITY)    EKG EKG Interpretation  Date/Time:  Friday January 08 2021 13:18:16 EST Ventricular Rate:  68 PR Interval:  121 QRS Duration: 86 QT Interval:  370 QTC Calculation: 394 R Axis:   49 Text Interpretation: Sinus rhythm Low voltage, precordial leads No significant change since last tracing Confirmed by Calvert Cantor [phone]) on 01/08/2021 1:34:00 PM  Radiology DG Chest 2 View  Result Date: 01/08/2021 CLINICAL DATA:  Chest pain EXAM: CHEST - 2 VIEW COMPARISON:  10/04/2017 FINDINGS:  The heart size and mediastinal contours are within normal limits. Both lungs are clear. No pleural effusion or pneumothorax. The visualized skeletal structures are unremarkable. IMPRESSION: No active cardiopulmonary disease. Electronically Signed   By: Macy Mis M.D.   On: 01/08/2021 14:02    Procedures Procedures   Medications Ordered in ED Medications - No data to display  ED Course  I have reviewed the triage vital signs and the nursing notes.  Pertinent labs & imaging results that were available during my care of the patient were reviewed by me and considered in my medical decision making (see chart for details).     MDM Rules/Calculators/A&P                            Patient Vitals for the past 24 hrs:  BP Temp Temp src Pulse Resp SpO2  01/08/21 2243 110/88 98 F (36.7 C) Oral 70 16 98 %  01/08/21 2200 106/89 -- -- 64 17 96 %  01/08/21 2125 121/80 -- -- 64 15 96 %  01/08/21 2000 126/73 -- -- 63 18 94 %  01/08/21 1920 (!) 138/99 -- -- 78 14 100 %  01/08/21 1316 104/71 98 F (36.7 C) Oral 69 18 100 %    At the time of discharge reevaluation with update and discussion with patient. After initial assessment and treatment,  an updated evaluation reveals no change in clinical status. Illness risk, progression of symptoms 2 a more unified process, discussed. Daleen Bo    Medical Decision Making: Summary: Middle-age female presenting with multiple symptoms, which are generally nonspecific and present for about a week.  There are no obvious clinical syndromes causing her discomfort.  She required ED evaluation for clarification of acute illnesses.  Critical Interventions- blood testing, radiographs, chest x-ray, EKG, cardiac monitor ; to evaluate  Chief Complaint  Patient presents with   Chest Pain    and assess for illness characterized as Self-Limited and Previously Undiagnosed   After These Interventions, the Patient was reevaluated and was found stable for  discharge.  No acute infectious processes, concern for venous thromboembolism, metabolic disorders or hemodynamic disorders.  Patient with significant anxiety regarding a prior injury, likely manifesting as malaise causing her distress.  This patient is presenting for evaluation of multiple system symptoms, which does require a range of treatment options, and is a complaint that involves a moderate risk of morbidity and mortality. The differential diagnoses include occult infection, metabolic disorder, unstable cardiac and pulmonary disorders that are not clinically evident. I decided to review external data, and in summary middle-aged female with vague symptoms, who is currently undergoing left hand therapy for an injury that occurred May 17, 2020.  She is seeing a psychiatrist for psychotherapy, and is frustrated by ongoing symptoms from the hand injury and lack of emotional support at home..  I did not require additional historical information from anyone, as the patient is lucid.  Clinical Laboratory Tests Ordered, included CBC and Metabolic panel. Review indicates normal findings. Emergent testing abnormality management required for stabilization-no Radiologic Tests Ordered, included chest x-ray.  I independently Visualized: Radiograph images, which show no infiltrate or edema  Cardiac Monitor Tracing which shows normal sinus rhythm, indicating cardiac stability  I did not require any additional medicine Test.  Pharmaceutical Risk Management OTC   Treatment Complication Risk Requirement low risk  outpatient treatment    CRITICAL CARE-no Performed by: Daleen Bo  Nursing Notes Reviewed/ Care Coordinated Applicable Imaging Reviewed Interpretation of Laboratory Data incorporated into ED treatment  The patient appears reasonably screened and/or stabilized for discharge and I doubt any other medical condition or other [facility] requiring further screening, evaluation, or treatment in the ED  at this time prior to discharge.  Plan: Home Medications-OTC analgesia of choice, continue medicines as usual; Home Treatments-gradual advance diet and activity; return here if the recommended treatment, does not improve the symptoms; Recommended follow up-PCP, as needed         Final Clinical Impression(s) / ED Diagnoses Final diagnoses:  Myalgia  Pain of left lower extremity    Rx / DC Orders ED Discharge Orders     None        Daleen Bo, MD 01/09/21 1001

## 2021-01-09 ENCOUNTER — Telehealth: Payer: Self-pay

## 2021-01-09 NOTE — Telephone Encounter (Signed)
Transition Care Management Follow-up Telephone Call Date of discharge and from where: 01/08/2021-[facility]  How have you been since you were released from the hospital? Patient stated she is feeling the same.  Any questions or concerns? Yes patient stated urine result was not discussed with her and she stills feel the same way. Patient stated she was told to follow up with her primary care provider. Patient has declined returning to an urgent care as she seen urgent care outside of [facility] and didn't feel like she was helped there either. Patient stated she will wait to see her primary care.    Items Reviewed: Did the pt receive and understand the discharge instructions provided? Yes  Medications obtained and verified?  No medications  sent at discharge Other? No  Any new allergies since your discharge? No  Dietary orders reviewed? No Do you have support at home? Yes   Home Care and Equipment/Supplies: Were home health services ordered? not applicable If so, what is the name of the agency? N/A  Has the agency set up a time to come to the patient's home? not applicable Were any new equipment or medical supplies ordered?  No What is the name of the medical supply agency? N/A Were you able to get the supplies/equipment? not applicable Do you have any questions related to the use of the equipment or supplies? No  Functional Questionnaire: (I = Independent and D = Dependent) ADLs: I  Bathing/Dressing- I  Meal Prep- I  Eating- I  Maintaining continence- I  Transferring/Ambulation- I  Managing Meds- I  Follow up appointments reviewed:  PCP Hospital f/u appt confirmed? No   Specialist Hospital f/u appt confirmed? No   Are transportation arrangements needed? No  If their condition worsens, is the pt aware to call PCP or go to the Emergency Dept.? Yes Was the patient provided with contact information for the PCP's office or ED? Yes Was to pt encouraged to call back with questions or  concerns? Yes

## 2021-01-12 DIAGNOSIS — F430 Acute stress reaction: Secondary | ICD-10-CM | POA: Diagnosis not present

## 2021-01-12 DIAGNOSIS — R0782 Intercostal pain: Secondary | ICD-10-CM | POA: Diagnosis not present

## 2021-01-12 DIAGNOSIS — R200 Anesthesia of skin: Secondary | ICD-10-CM | POA: Diagnosis not present

## 2021-01-12 DIAGNOSIS — M179 Osteoarthritis of knee, unspecified: Secondary | ICD-10-CM | POA: Diagnosis not present

## 2021-01-12 DIAGNOSIS — S68111D Complete traumatic metacarpophalangeal amputation of left index finger, subsequent encounter: Secondary | ICD-10-CM | POA: Diagnosis not present

## 2021-01-25 DIAGNOSIS — R9431 Abnormal electrocardiogram [ECG] [EKG]: Secondary | ICD-10-CM | POA: Diagnosis not present

## 2021-02-04 DIAGNOSIS — I4949 Other premature depolarization: Secondary | ICD-10-CM | POA: Diagnosis not present

## 2021-02-04 DIAGNOSIS — R072 Precordial pain: Secondary | ICD-10-CM | POA: Diagnosis not present

## 2021-02-16 DIAGNOSIS — Z1211 Encounter for screening for malignant neoplasm of colon: Secondary | ICD-10-CM | POA: Diagnosis not present

## 2021-02-25 DIAGNOSIS — I471 Supraventricular tachycardia: Secondary | ICD-10-CM | POA: Diagnosis not present

## 2021-02-25 DIAGNOSIS — Z6832 Body mass index (BMI) 32.0-32.9, adult: Secondary | ICD-10-CM | POA: Diagnosis not present

## 2021-02-25 DIAGNOSIS — R9431 Abnormal electrocardiogram [ECG] [EKG]: Secondary | ICD-10-CM | POA: Diagnosis not present

## 2021-02-25 DIAGNOSIS — R072 Precordial pain: Secondary | ICD-10-CM | POA: Diagnosis not present

## 2021-03-08 DIAGNOSIS — I471 Supraventricular tachycardia: Secondary | ICD-10-CM | POA: Diagnosis not present

## 2021-03-08 DIAGNOSIS — R072 Precordial pain: Secondary | ICD-10-CM | POA: Diagnosis not present

## 2021-03-08 DIAGNOSIS — R9431 Abnormal electrocardiogram [ECG] [EKG]: Secondary | ICD-10-CM | POA: Diagnosis not present

## 2021-03-08 DIAGNOSIS — Z6832 Body mass index (BMI) 32.0-32.9, adult: Secondary | ICD-10-CM | POA: Diagnosis not present

## 2021-03-15 DIAGNOSIS — Z1211 Encounter for screening for malignant neoplasm of colon: Secondary | ICD-10-CM | POA: Diagnosis not present

## 2021-03-24 DIAGNOSIS — F430 Acute stress reaction: Secondary | ICD-10-CM | POA: Diagnosis not present

## 2021-03-24 DIAGNOSIS — S68111D Complete traumatic metacarpophalangeal amputation of left index finger, subsequent encounter: Secondary | ICD-10-CM | POA: Diagnosis not present

## 2021-03-24 DIAGNOSIS — N951 Menopausal and female climacteric states: Secondary | ICD-10-CM | POA: Diagnosis not present

## 2021-04-27 DIAGNOSIS — F430 Acute stress reaction: Secondary | ICD-10-CM | POA: Diagnosis not present

## 2021-04-27 DIAGNOSIS — M179 Osteoarthritis of knee, unspecified: Secondary | ICD-10-CM | POA: Diagnosis not present

## 2021-05-11 ENCOUNTER — Other Ambulatory Visit: Payer: Self-pay | Admitting: Internal Medicine

## 2021-05-11 DIAGNOSIS — G894 Chronic pain syndrome: Secondary | ICD-10-CM | POA: Diagnosis not present

## 2021-05-11 DIAGNOSIS — E559 Vitamin D deficiency, unspecified: Secondary | ICD-10-CM | POA: Diagnosis not present

## 2021-05-11 DIAGNOSIS — Z0000 Encounter for general adult medical examination without abnormal findings: Secondary | ICD-10-CM | POA: Diagnosis not present

## 2021-05-11 DIAGNOSIS — Z1239 Encounter for other screening for malignant neoplasm of breast: Secondary | ICD-10-CM | POA: Diagnosis not present

## 2021-05-11 DIAGNOSIS — N951 Menopausal and female climacteric states: Secondary | ICD-10-CM | POA: Diagnosis not present

## 2021-05-11 DIAGNOSIS — Z13220 Encounter for screening for lipoid disorders: Secondary | ICD-10-CM | POA: Diagnosis not present

## 2021-05-11 DIAGNOSIS — Z131 Encounter for screening for diabetes mellitus: Secondary | ICD-10-CM | POA: Diagnosis not present

## 2021-05-11 DIAGNOSIS — N3001 Acute cystitis with hematuria: Secondary | ICD-10-CM | POA: Diagnosis not present

## 2021-05-12 LAB — COMPLETE METABOLIC PANEL WITH GFR
AG Ratio: 1.3 (calc) (ref 1.0–2.5)
ALT: 11 U/L (ref 6–29)
AST: 17 U/L (ref 10–35)
Albumin: 3.8 g/dL (ref 3.6–5.1)
Alkaline phosphatase (APISO): 72 U/L (ref 37–153)
BUN: 13 mg/dL (ref 7–25)
CO2: 24 mmol/L (ref 20–32)
Calcium: 8.8 mg/dL (ref 8.6–10.4)
Chloride: 108 mmol/L (ref 98–110)
Creat: 0.92 mg/dL (ref 0.50–1.03)
Globulin: 3 g/dL (calc) (ref 1.9–3.7)
Glucose, Bld: 89 mg/dL (ref 65–99)
Potassium: 4.3 mmol/L (ref 3.5–5.3)
Sodium: 140 mmol/L (ref 135–146)
Total Bilirubin: 0.4 mg/dL (ref 0.2–1.2)
Total Protein: 6.8 g/dL (ref 6.1–8.1)
eGFR: 74 mL/min/{1.73_m2} (ref >=60)

## 2021-05-12 LAB — CBC
HCT: 39.8 % (ref 35.0–45.0)
Hemoglobin: 13.2 g/dL (ref 11.7–15.5)
MCH: 30.8 pg (ref 27.0–33.0)
MCHC: 33.2 g/dL (ref 32.0–36.0)
MCV: 93 fL (ref 80.0–100.0)
MPV: 11.2 fL (ref 7.5–12.5)
Platelets: 222 10*3/uL (ref 140–400)
RBC: 4.28 10*6/uL (ref 3.80–5.10)
RDW: 11.9 % (ref 11.0–15.0)
WBC: 5.9 10*3/uL (ref 3.8–10.8)

## 2021-05-12 LAB — URINE CULTURE
MICRO NUMBER:: 13340058
SPECIMEN QUALITY:: ADEQUATE

## 2021-05-12 LAB — LIPID PANEL
Cholesterol: 189 mg/dL (ref <200)
HDL: 82 mg/dL (ref >=50)
LDL Cholesterol (Calc): 89 mg/dL (calc)
Non-HDL Cholesterol (Calc): 107 mg/dL (calc) (ref <130)
Total CHOL/HDL Ratio: 2.3 (calc) (ref <5.0)
Triglycerides: 90 mg/dL (ref <150)

## 2021-05-12 LAB — VITAMIN D 25 HYDROXY (VIT D DEFICIENCY, FRACTURES): Vit D, 25-Hydroxy: 34 ng/mL (ref 30–100)

## 2021-05-12 LAB — TSH: TSH: 1.05 mIU/L

## 2021-05-18 DIAGNOSIS — G894 Chronic pain syndrome: Secondary | ICD-10-CM | POA: Diagnosis not present

## 2021-05-18 DIAGNOSIS — S68111D Complete traumatic metacarpophalangeal amputation of left index finger, subsequent encounter: Secondary | ICD-10-CM | POA: Diagnosis not present

## 2021-05-18 DIAGNOSIS — F430 Acute stress reaction: Secondary | ICD-10-CM | POA: Diagnosis not present

## 2021-05-26 DIAGNOSIS — F4310 Post-traumatic stress disorder, unspecified: Secondary | ICD-10-CM | POA: Diagnosis not present

## 2021-05-31 DIAGNOSIS — F4310 Post-traumatic stress disorder, unspecified: Secondary | ICD-10-CM | POA: Diagnosis not present

## 2021-06-02 DIAGNOSIS — F4310 Post-traumatic stress disorder, unspecified: Secondary | ICD-10-CM | POA: Diagnosis not present

## 2021-06-07 DIAGNOSIS — F4310 Post-traumatic stress disorder, unspecified: Secondary | ICD-10-CM | POA: Diagnosis not present

## 2021-06-18 DIAGNOSIS — M5412 Radiculopathy, cervical region: Secondary | ICD-10-CM | POA: Diagnosis not present

## 2021-06-18 DIAGNOSIS — M542 Cervicalgia: Secondary | ICD-10-CM | POA: Diagnosis not present

## 2021-06-18 DIAGNOSIS — G894 Chronic pain syndrome: Secondary | ICD-10-CM | POA: Diagnosis not present

## 2021-06-18 DIAGNOSIS — M79642 Pain in left hand: Secondary | ICD-10-CM | POA: Diagnosis not present

## 2021-06-18 DIAGNOSIS — R52 Pain, unspecified: Secondary | ICD-10-CM | POA: Diagnosis not present

## 2021-06-18 DIAGNOSIS — Z79891 Long term (current) use of opiate analgesic: Secondary | ICD-10-CM | POA: Diagnosis not present

## 2021-06-18 DIAGNOSIS — M79602 Pain in left arm: Secondary | ICD-10-CM | POA: Diagnosis not present

## 2021-06-22 DIAGNOSIS — G894 Chronic pain syndrome: Secondary | ICD-10-CM | POA: Diagnosis not present

## 2021-06-22 DIAGNOSIS — N760 Acute vaginitis: Secondary | ICD-10-CM | POA: Diagnosis not present

## 2021-06-22 DIAGNOSIS — F430 Acute stress reaction: Secondary | ICD-10-CM | POA: Diagnosis not present

## 2021-06-28 ENCOUNTER — Encounter (HOSPITAL_COMMUNITY): Payer: Self-pay | Admitting: Emergency Medicine

## 2021-06-28 ENCOUNTER — Emergency Department (HOSPITAL_COMMUNITY): Payer: Medicaid Other

## 2021-06-28 ENCOUNTER — Emergency Department (HOSPITAL_COMMUNITY)
Admission: EM | Admit: 2021-06-28 | Discharge: 2021-06-28 | Disposition: A | Discharge status: Home / Self Care | Payer: Medicaid Other | Attending: Emergency Medicine | Admitting: Emergency Medicine

## 2021-06-28 DIAGNOSIS — R350 Frequency of micturition: Secondary | ICD-10-CM | POA: Diagnosis present

## 2021-06-28 DIAGNOSIS — K5900 Constipation, unspecified: Secondary | ICD-10-CM | POA: Insufficient documentation

## 2021-06-28 DIAGNOSIS — K760 Fatty (change of) liver, not elsewhere classified: Secondary | ICD-10-CM | POA: Diagnosis not present

## 2021-06-28 DIAGNOSIS — N12 Tubulo-interstitial nephritis, not specified as acute or chronic: Secondary | ICD-10-CM | POA: Insufficient documentation

## 2021-06-28 DIAGNOSIS — R1031 Right lower quadrant pain: Secondary | ICD-10-CM | POA: Diagnosis not present

## 2021-06-28 LAB — COMPREHENSIVE METABOLIC PANEL
ALT: 19 U/L (ref 0–44)
AST: 22 U/L (ref 15–41)
Albumin: 3.6 g/dL (ref 3.5–5.0)
Alkaline Phosphatase: 74 U/L (ref 38–126)
Anion gap: 7 (ref 5–15)
BUN: 9 mg/dL (ref 6–20)
CO2: 28 mmol/L (ref 22–32)
Calcium: 9.2 mg/dL (ref 8.9–10.3)
Chloride: 106 mmol/L (ref 98–111)
Creatinine, Ser: 1.04 mg/dL — ABNORMAL HIGH (ref 0.44–1.00)
GFR, Estimated: >60 mL/min (ref >60)
Glucose, Bld: 107 mg/dL — ABNORMAL HIGH (ref 70–99)
Potassium: 3.8 mmol/L (ref 3.5–5.1)
Sodium: 141 mmol/L (ref 135–145)
Total Bilirubin: 0.7 mg/dL (ref 0.3–1.2)
Total Protein: 7.6 g/dL (ref 6.5–8.1)

## 2021-06-28 LAB — URINALYSIS, ROUTINE W REFLEX MICROSCOPIC
Bilirubin Urine: NEGATIVE
Glucose, UA: NEGATIVE mg/dL
Ketones, ur: 5 mg/dL — AB
Nitrite: NEGATIVE
Protein, ur: 100 mg/dL — AB
Specific Gravity, Urine: 1.014 (ref 1.005–1.030)
WBC, UA: >50 WBC/hpf — ABNORMAL HIGH (ref 0–5)
pH: 5 (ref 5.0–8.0)

## 2021-06-28 LAB — CBC
HCT: 41.6 % (ref 36.0–46.0)
Hemoglobin: 13.3 g/dL (ref 12.0–15.0)
MCH: 29.8 pg (ref 26.0–34.0)
MCHC: 32 g/dL (ref 30.0–36.0)
MCV: 93.3 fL (ref 80.0–100.0)
Platelets: 241 10*3/uL (ref 150–400)
RBC: 4.46 MIL/uL (ref 3.87–5.11)
RDW: 11.8 % (ref 11.5–15.5)
WBC: 8.1 10*3/uL (ref 4.0–10.5)
nRBC: 0 % (ref 0.0–0.2)

## 2021-06-28 LAB — LIPASE, BLOOD: Lipase: 32 U/L (ref 11–51)

## 2021-06-28 LAB — I-STAT BETA HCG BLOOD, ED (MC, WL, AP ONLY): I-stat hCG, quantitative: <5 m[IU]/mL (ref <5)

## 2021-06-28 MED ORDER — CEPHALEXIN 500 MG PO CAPS: 500.0000 mg | ORAL_CAPSULE | Freq: Four times a day (QID) | ORAL | 0 refills | Status: DC

## 2021-06-28 MED ORDER — IOHEXOL 300 MG/ML  SOLN
100.0000 mL | Freq: Once | INTRAMUSCULAR | Status: AC | PRN
  Administered 2021-06-28: 100 mL via INTRAVENOUS

## 2021-06-28 MED ORDER — CEPHALEXIN 500 MG PO CAPS: 500.0000 mg | ORAL_CAPSULE | Freq: Four times a day (QID) | ORAL | 0 refills | Status: AC

## 2021-06-28 NOTE — ED Provider Notes (Signed)
[facility] DEPT Provider Note   CSN: [id number] Arrival date & time: 06/28/21  0859     History  Chief Complaint  Patient presents with   Urinary Frequency    Maria Logan is a 56 y.o. female.  The history is provided by the patient.  Urinary Frequency  Patient presents with urinary frequency.  Also abdominal pain flank pain.  Constipation.  Is had for around 3 weeks.  Saw PCP and reportedly had urinalysis done.  States she was given a medicine that started with fibro.  Unsure what it was but potentially it was nitrofurantoin.  States did not work and may have even made things worse.  No vaginal bleeding or discharge.  States she was started on metronidazole after that.  Still finishing out.  Continued pain.  Also some constipation.  Right-sided abdominal pain that now was up towards the back.  Still having dysuria.  No fevers or chills.  No nausea or vomiting.     Home Medications Prior to Admission medications   Medication Sig Start Date End Date Taking? Authorizing Provider  cephALEXin (KEFLEX) 500 MG capsule Take 1 capsule (500 mg total) by mouth 4 (four) times daily for 14 days. 06/28/21 07/12/21 Yes Davonna Belling, MD  benzonatate (TESSALON) 100 MG capsule Take 1-2 capsules (100-200 mg total) by mouth every 8 (eight) hours as needed for cough. 12/17/19   Zigmund Gottron, NP  cetirizine (ZYRTEC) 10 MG tablet Take 1 tablet (10 mg total) by mouth daily. 12/17/19   Zigmund Gottron, NP  estradiol (ESTRACE) 1 MG tablet Take 1 tablet (1 mg total) by mouth daily. Patient not taking: Reported on 05/26/2019 12/04/17   Woodroe Mode, MD  gabapentin (NEURONTIN) 100 MG capsule Take 1 capsule (100 mg total) by mouth 3 (three) times daily. 06/02/20   Margarita Mail, PA-C  HYDROcodone-acetaminophen (NORCO/VICODIN) 5-325 MG tablet Take 2 tablets by mouth every 4 (four) hours as needed. 05/27/20   Valarie Merino, MD  meclizine (ANTIVERT) 12.5 MG tablet Take 1  tablet (12.5 mg total) by mouth 3 (three) times daily as needed for dizziness. Patient not taking: Reported on 01/17/2019 05/10/18   Robyn Haber, MD  predniSONE (DELTASONE) 20 MG tablet Two daily with food Patient not taking: Reported on 05/26/2019 08/18/18   Robyn Haber, MD  sertraline (ZOLOFT) 50 MG tablet Take 50 mg by mouth daily.    [provider]  triamterene-hydrochlorothiazide (MAXZIDE-25) 37.5-25 MG tablet Take 1 tablet by mouth daily. Patient not taking: Reported on 05/26/2019 08/18/18   Robyn Haber, MD  Vitamin D, Ergocalciferol, (DRISDOL) 1.25 MG (50000 UNIT) CAPS capsule Take 50,000 Units by mouth every Friday.    [provider]      Allergies    Patient has no known allergies.    Review of Systems   Review of Systems  Genitourinary:  Positive for frequency.    Physical Exam Updated Vital Signs BP 106/84   Pulse 83   Temp 98.3 F (36.8 C)   Resp 20   LMP 12/09/2014 (Approximate)   SpO2 98%  Physical Exam Vitals reviewed.  Cardiovascular:     Rate and Rhythm: Normal rate.  Abdominal:     Tenderness: There is abdominal tenderness.     Comments: Suprapubic right lower quadrant and right upper quadrant tenderness.  No guarding.  No hernia palpated.  Musculoskeletal:        General: No tenderness.  Skin:    General: Skin is  warm.  Neurological:     Mental Status: She is alert and oriented to person, place, and time.     ED Results / Procedures / Treatments   Labs (all labs ordered are listed, but only abnormal results are displayed) Labs Reviewed  COMPREHENSIVE METABOLIC PANEL - Abnormal; Notable for the following components:      Result Value   Glucose, Bld 107 (*)    Creatinine, Ser 1.04 (*)    All other components within normal limits  URINALYSIS, ROUTINE W REFLEX MICROSCOPIC - Abnormal; Notable for the following components:   APPearance CLOUDY (*)    Hgb urine dipstick LARGE (*)    Ketones, ur 5 (*)    Protein, ur 100 (*)     Leukocytes,Ua LARGE (*)    WBC, UA >50 (*)    Bacteria, UA RARE (*)    All other components within normal limits  URINE CULTURE  LIPASE, BLOOD  CBC  I-STAT BETA HCG BLOOD, ED ([facility], [facility], [facility] ONLY)    EKG None  Radiology CT ABDOMEN PELVIS W CONTRAST  Result Date: 06/28/2021 CLINICAL DATA:  Right lower quadrant pain and UTI for several weeks, initial encounter EXAM: CT ABDOMEN AND PELVIS WITH CONTRAST TECHNIQUE: Multidetector CT imaging of the abdomen and pelvis was performed using the standard protocol following bolus administration of intravenous contrast. RADIATION DOSE REDUCTION: This exam was performed according to the departmental dose-optimization program which includes automated exposure control, adjustment of the mA and/or kV according to patient size and/or use of iterative reconstruction technique. CONTRAST:  167m OMNIPAQUE IOHEXOL 300 MG/ML  SOLN COMPARISON:  None Available. FINDINGS: Lower chest: No acute abnormality. Hepatobiliary: Mild fatty infiltration of the liver is noted. Gallbladder is within normal limits. Pancreas: Unremarkable. No pancreatic ductal dilatation or surrounding inflammatory changes. Spleen: Normal in size without focal abnormality. Adrenals/Urinary Tract: Adrenal glands are within normal limits. Kidneys are well visualized bilaterally. Left kidney demonstrates a 3.8 cm simple cyst anteriorly. No follow-up is recommended. Patchy decreased enhancement is noted within the right kidney consistent with pyelonephritis. No obstructive changes are seen. No renal calculi are noted. The bladder is decompressed. Stomach/Bowel: No obstructive or inflammatory changes of the colon are seen. The appendix is well visualized without inflammatory change. Small bowel and stomach are within normal limits. Vascular/Lymphatic: Aortic atherosclerosis. No enlarged abdominal or pelvic lymph nodes. Reproductive: Status post hysterectomy. No adnexal masses. Other: No abdominal wall hernia or  abnormality. No abdominopelvic ascites. Musculoskeletal: Degenerative changes of the lumbar spine are noted. IMPRESSION: Changes consistent with right-sided pyelonephritis. Fatty liver. Electronically Signed   By: MInez CatalinaM.D.   On: 06/28/2021 20:12    Procedures Procedures    Medications Ordered in ED Medications  iohexol (OMNIPAQUE) 300 MG/ML solution 100 mL (100 mLs Intravenous Contrast Given 06/28/21 1949)    ED Course/ Medical Decision Making/ A&P                           Medical Decision Making Amount and/or Complexity of Data Reviewed Labs: ordered. Radiology: ordered.  Risk Prescription drug management.  Differential diagnosis included appendicitis, urinary tract infection, intra-abdominal pathology. Patient with dysuria.  Has had for the last 3 weeks.  Has been on what sounds like nitrofurantoin and then Flagyl reportedly for urinary tract infection.  Lab work reassuring although urine does show likely infection.  CT scan done and independently interpreted by me and shows likely right-sided pyelonephritis.  This correlates with her  exam but does show some right-sided tenderness.  Well-appearing does not appear septic.  Appears stable for discharge home.  Cultures been sent and will be notified if does not match up with the Keflex she will be prescribed.  Will discharge home with outpatient follow-up as needed.        Final Clinical Impression(s) / ED Diagnoses Final diagnoses:  Pyelonephritis    Rx / DC Orders ED Discharge Orders          Ordered    cephALEXin (KEFLEX) 500 MG capsule  4 times daily        06/28/21 2027              Davonna Belling, MD 06/28/21 2029

## 2021-06-28 NOTE — ED Triage Notes (Signed)
Patient c/o urinary frequency and cloudy discharge. Reports dx with UTI x3 weeks ago and taking macrobid and flagyl since that time. Also reports constipation. States she took dulcolax last week with relief but is feeling constipated again.

## 2021-06-28 NOTE — ED Notes (Signed)
Pt resting well. Went to make introduction and get a run down from pt and pt advises she has no needs at this time from me.

## 2021-06-28 NOTE — ED Notes (Signed)
Pt is also showing me what looks to be a white spot on her tonsils that she forgot to mention to the provider. Provider was notified.

## 2021-06-30 LAB — URINE CULTURE: Culture: 30000 — AB

## 2021-07-01 ENCOUNTER — Telehealth: Payer: Self-pay | Admitting: *Deleted

## 2021-07-01 NOTE — Telephone Encounter (Signed)
Post ED Visit - Positive Culture Follow-up  Culture report reviewed by antimicrobial stewardship pharmacist: [facility] Team '[]'$  Elenor Quinones, Pharm.D. '[]'$  Heide Guile, Pharm.D., BCPS AQ-ID '[]'$  Parks Neptune, Pharm.D., BCPS '[]'$  Alycia Rossetti, Pharm.D., BCPS '[]'$  [place], Pharm.D., BCPS, AAHIVP '[]'$  Legrand Como, Pharm.D., BCPS, AAHIVP '[]'$  Salome Arnt, PharmD, BCPS '[]'$  Johnnette Gourd, PharmD, BCPS '[]'$  Hughes Better, PharmD, BCPS '[]'$  Leeroy Cha, PharmD '[]'$  Laqueta Linden, PharmD, BCPS '[]'$  Albertina Parr, PharmD  [place] Team '[]'$  Leodis Sias, PharmD '[]'$  Lindell Spar, PharmD '[]'$  Royetta Asal, PharmD '[]'$  Graylin Shiver, Rph '[]'$  Rema Fendt) Glennon Mac, PharmD '[]'$  Arlyn Dunning, PharmD '[]'$  Netta Cedars, PharmD '[]'$  Dia Sitter, PharmD '[]'$  Leone Haven, PharmD '[]'$  Gretta Arab, PharmD '[]'$  Theodis Shove, PharmD '[]'$  Peggyann Juba, PharmD '[]'$  Reuel Boom, PharmD   Positive urine culture Treated with Cephalexin,s ymptom check with resolving symptoms and no further patient follow-up is required at this time. Cherlynn June, PA-C  Harlon Flor Marmet 07/01/2021, 12:35 PM

## 2021-07-01 NOTE — Progress Notes (Signed)
ED Antimicrobial Stewardship Positive Culture Follow Up   Maria Logan is an 56 y.o. female who presented to [facility] [place] on 06/28/2021 with a chief complaint of  Chief Complaint  Patient presents with   Urinary Frequency    Recent Results (from the past 720 hour(s))  Urine Culture     Status: Abnormal   Collection Time: 06/28/21  9:38 AM   Specimen: Urine, Clean Catch  Result Value Ref Range Status   Specimen Description   Final    URINE, CLEAN CATCH Performed at [facility], [address] [place], [place] [address]    Special Requests   Final    NONE Performed at [facility], [address] [place], [place] [address]    Culture (A)  Final    30,000 COLONIES/mL ESCHERICHIA COLI 20,000 COLONIES/mL ENTEROCOCCUS FAECALIS    Report Status 06/30/2021 FINAL  Final   Organism ID, Bacteria ESCHERICHIA COLI (A)  Final   Organism ID, Bacteria ENTEROCOCCUS FAECALIS (A)  Final      Susceptibility   Escherichia coli - MIC*    AMPICILLIN >=32 RESISTANT Resistant     CEFAZOLIN <=4 SENSITIVE Sensitive     CEFEPIME <=0.12 SENSITIVE Sensitive     CEFTRIAXONE <=0.25 SENSITIVE Sensitive     CIPROFLOXACIN <=0.25 SENSITIVE Sensitive     GENTAMICIN <=1 SENSITIVE Sensitive     IMIPENEM <=0.25 SENSITIVE Sensitive     NITROFURANTOIN <=16 SENSITIVE Sensitive     TRIMETH/SULFA <=20 SENSITIVE Sensitive     AMPICILLIN/SULBACTAM >=32 RESISTANT Resistant     PIP/TAZO <=4 SENSITIVE Sensitive     * 30,000 COLONIES/mL ESCHERICHIA COLI   Enterococcus faecalis - MIC*    AMPICILLIN <=2 SENSITIVE Sensitive     NITROFURANTOIN <=16 SENSITIVE Sensitive     VANCOMYCIN 2 SENSITIVE Sensitive     * 20,000 COLONIES/mL ENTEROCOCCUS FAECALIS    [x]  Treated with Cephalexin 500 mg caps 4 times daily for 14 days, one of two present organisms resistant to prescribed antimicrobial   Patient admitted on 06/28/21 w/ complaints of flank pain, dysuria, and urinary frequency w/out  fever or chills. She was experiencing these symptoms for 3 weeks prior and had  been to her PCP; her PCP prescribed Macrobid and Flagyl, neither of which improved her condition. Upon admit a CT of abdomen was performed that noted likely right sided pyelonephritis. The symptoms support this dx. Urinalysis detected WBC > 50, 0-5 epithelial cells (clean catch), and a large leukocyte load w/ no nitrites. She was discharged same day on Keflex as above. Urine culture returned E.coli which was susceptible to Keflex and E. Faecalis which was not, both at sub 100,000 colonies/mL. The faecalis is susceptible to amoxicillin. Spoke to provider and determined the best option to kill both bacteria while maintaining a narrow spectrum was the use of amoxicillin along w/ Keflex if patient is still symptomatic. Given low colony count recommend a symptom check and shortening of Keflex.  Call patient for symptom check, d/c Keflex at 7 days of use (6/26)  If symptomatic prescribe amoxicillin 500 mg TID for 7 days  ED Provider: [facility]   Zella Ball 07/01/2021, 8:19 AM PharmD Candidate ID phone numbers Monday - Friday phone -  [phone] Saturday - Sunday phone - [phone]

## 2021-07-16 DIAGNOSIS — M542 Cervicalgia: Secondary | ICD-10-CM | POA: Diagnosis not present

## 2021-07-16 DIAGNOSIS — M5412 Radiculopathy, cervical region: Secondary | ICD-10-CM | POA: Diagnosis not present

## 2021-07-16 DIAGNOSIS — R52 Pain, unspecified: Secondary | ICD-10-CM | POA: Diagnosis not present

## 2021-07-16 DIAGNOSIS — M79642 Pain in left hand: Secondary | ICD-10-CM | POA: Diagnosis not present

## 2021-07-16 DIAGNOSIS — M79602 Pain in left arm: Secondary | ICD-10-CM | POA: Diagnosis not present

## 2021-08-13 DIAGNOSIS — M79642 Pain in left hand: Secondary | ICD-10-CM | POA: Diagnosis not present

## 2021-08-13 DIAGNOSIS — Z79891 Long term (current) use of opiate analgesic: Secondary | ICD-10-CM | POA: Diagnosis not present

## 2021-08-13 DIAGNOSIS — M79602 Pain in left arm: Secondary | ICD-10-CM | POA: Diagnosis not present

## 2021-08-13 DIAGNOSIS — G894 Chronic pain syndrome: Secondary | ICD-10-CM | POA: Diagnosis not present

## 2021-08-13 DIAGNOSIS — M542 Cervicalgia: Secondary | ICD-10-CM | POA: Diagnosis not present

## 2021-08-13 DIAGNOSIS — M5136 Other intervertebral disc degeneration, lumbar region: Secondary | ICD-10-CM | POA: Diagnosis not present

## 2021-08-13 DIAGNOSIS — M5412 Radiculopathy, cervical region: Secondary | ICD-10-CM | POA: Diagnosis not present

## 2021-08-13 DIAGNOSIS — R52 Pain, unspecified: Secondary | ICD-10-CM | POA: Diagnosis not present

## 2021-09-06 DIAGNOSIS — R5383 Other fatigue: Secondary | ICD-10-CM | POA: Diagnosis not present

## 2021-09-06 DIAGNOSIS — R300 Dysuria: Secondary | ICD-10-CM | POA: Diagnosis not present

## 2021-09-06 DIAGNOSIS — E559 Vitamin D deficiency, unspecified: Secondary | ICD-10-CM | POA: Diagnosis not present

## 2021-09-06 DIAGNOSIS — Z6830 Body mass index (BMI) 30.0-30.9, adult: Secondary | ICD-10-CM | POA: Diagnosis not present

## 2021-09-06 DIAGNOSIS — R319 Hematuria, unspecified: Secondary | ICD-10-CM | POA: Diagnosis not present

## 2021-09-06 DIAGNOSIS — Z0000 Encounter for general adult medical examination without abnormal findings: Secondary | ICD-10-CM | POA: Diagnosis not present

## 2021-09-06 DIAGNOSIS — Z1159 Encounter for screening for other viral diseases: Secondary | ICD-10-CM | POA: Diagnosis not present

## 2021-09-06 DIAGNOSIS — Z0130 Encounter for examination of blood pressure without abnormal findings: Secondary | ICD-10-CM | POA: Diagnosis not present

## 2021-09-06 DIAGNOSIS — I471 Supraventricular tachycardia: Secondary | ICD-10-CM | POA: Diagnosis not present

## 2021-09-08 DIAGNOSIS — R52 Pain, unspecified: Secondary | ICD-10-CM | POA: Diagnosis not present

## 2021-09-08 DIAGNOSIS — M542 Cervicalgia: Secondary | ICD-10-CM | POA: Diagnosis not present

## 2021-09-08 DIAGNOSIS — M79602 Pain in left arm: Secondary | ICD-10-CM | POA: Diagnosis not present

## 2021-09-08 DIAGNOSIS — R319 Hematuria, unspecified: Secondary | ICD-10-CM | POA: Diagnosis not present

## 2021-09-08 DIAGNOSIS — M79642 Pain in left hand: Secondary | ICD-10-CM | POA: Diagnosis not present

## 2021-09-08 DIAGNOSIS — M5412 Radiculopathy, cervical region: Secondary | ICD-10-CM | POA: Diagnosis not present

## 2021-09-08 DIAGNOSIS — G894 Chronic pain syndrome: Secondary | ICD-10-CM | POA: Diagnosis not present

## 2021-09-08 DIAGNOSIS — Z79891 Long term (current) use of opiate analgesic: Secondary | ICD-10-CM | POA: Diagnosis not present

## 2021-09-09 DIAGNOSIS — R232 Flushing: Secondary | ICD-10-CM | POA: Diagnosis not present

## 2021-09-09 DIAGNOSIS — Z789 Other specified health status: Secondary | ICD-10-CM | POA: Diagnosis not present

## 2021-09-09 DIAGNOSIS — Z0189 Encounter for other specified special examinations: Secondary | ICD-10-CM | POA: Diagnosis not present

## 2021-09-09 DIAGNOSIS — N941 Unspecified dyspareunia: Secondary | ICD-10-CM | POA: Diagnosis not present

## 2021-09-09 DIAGNOSIS — R319 Hematuria, unspecified: Secondary | ICD-10-CM | POA: Diagnosis not present

## 2021-09-09 DIAGNOSIS — Z1231 Encounter for screening mammogram for malignant neoplasm of breast: Secondary | ICD-10-CM | POA: Diagnosis not present

## 2021-09-09 DIAGNOSIS — Z6831 Body mass index (BMI) 31.0-31.9, adult: Secondary | ICD-10-CM | POA: Diagnosis not present

## 2021-09-09 DIAGNOSIS — Z780 Asymptomatic menopausal state: Secondary | ICD-10-CM | POA: Diagnosis not present

## 2021-09-09 DIAGNOSIS — Z0130 Encounter for examination of blood pressure without abnormal findings: Secondary | ICD-10-CM | POA: Diagnosis not present

## 2021-09-09 DIAGNOSIS — N281 Cyst of kidney, acquired: Secondary | ICD-10-CM | POA: Diagnosis not present

## 2021-09-09 DIAGNOSIS — Z87898 Personal history of other specified conditions: Secondary | ICD-10-CM | POA: Diagnosis not present

## 2021-10-06 DIAGNOSIS — M542 Cervicalgia: Secondary | ICD-10-CM | POA: Diagnosis not present

## 2021-10-06 DIAGNOSIS — Z79891 Long term (current) use of opiate analgesic: Secondary | ICD-10-CM | POA: Diagnosis not present

## 2021-10-06 DIAGNOSIS — M79602 Pain in left arm: Secondary | ICD-10-CM | POA: Diagnosis not present

## 2021-10-06 DIAGNOSIS — M5412 Radiculopathy, cervical region: Secondary | ICD-10-CM | POA: Diagnosis not present

## 2021-10-06 DIAGNOSIS — M79642 Pain in left hand: Secondary | ICD-10-CM | POA: Diagnosis not present

## 2021-10-06 DIAGNOSIS — R52 Pain, unspecified: Secondary | ICD-10-CM | POA: Diagnosis not present

## 2021-10-06 DIAGNOSIS — G894 Chronic pain syndrome: Secondary | ICD-10-CM | POA: Diagnosis not present

## 2021-10-25 DIAGNOSIS — J069 Acute upper respiratory infection, unspecified: Secondary | ICD-10-CM | POA: Diagnosis not present

## 2021-10-25 DIAGNOSIS — Z1231 Encounter for screening mammogram for malignant neoplasm of breast: Secondary | ICD-10-CM | POA: Diagnosis not present

## 2021-10-25 DIAGNOSIS — Z6831 Body mass index (BMI) 31.0-31.9, adult: Secondary | ICD-10-CM | POA: Diagnosis not present

## 2021-10-25 DIAGNOSIS — N941 Unspecified dyspareunia: Secondary | ICD-10-CM | POA: Diagnosis not present

## 2021-10-25 DIAGNOSIS — Z87898 Personal history of other specified conditions: Secondary | ICD-10-CM | POA: Diagnosis not present

## 2021-10-25 DIAGNOSIS — R319 Hematuria, unspecified: Secondary | ICD-10-CM | POA: Diagnosis not present

## 2021-10-25 DIAGNOSIS — R232 Flushing: Secondary | ICD-10-CM | POA: Diagnosis not present

## 2021-10-25 DIAGNOSIS — Z789 Other specified health status: Secondary | ICD-10-CM | POA: Diagnosis not present

## 2021-10-25 DIAGNOSIS — Z780 Asymptomatic menopausal state: Secondary | ICD-10-CM | POA: Diagnosis not present

## 2021-10-25 DIAGNOSIS — N281 Cyst of kidney, acquired: Secondary | ICD-10-CM | POA: Diagnosis not present

## 2021-10-25 DIAGNOSIS — J029 Acute pharyngitis, unspecified: Secondary | ICD-10-CM | POA: Diagnosis not present

## 2021-10-27 DIAGNOSIS — R351 Nocturia: Secondary | ICD-10-CM | POA: Diagnosis not present

## 2021-10-27 DIAGNOSIS — R350 Frequency of micturition: Secondary | ICD-10-CM | POA: Diagnosis not present

## 2021-10-27 DIAGNOSIS — N3946 Mixed incontinence: Secondary | ICD-10-CM | POA: Diagnosis not present

## 2021-11-09 DIAGNOSIS — M79642 Pain in left hand: Secondary | ICD-10-CM | POA: Diagnosis not present

## 2021-11-09 DIAGNOSIS — Z79891 Long term (current) use of opiate analgesic: Secondary | ICD-10-CM | POA: Diagnosis not present

## 2021-11-09 DIAGNOSIS — R52 Pain, unspecified: Secondary | ICD-10-CM | POA: Diagnosis not present

## 2021-11-09 DIAGNOSIS — M542 Cervicalgia: Secondary | ICD-10-CM | POA: Diagnosis not present

## 2021-11-09 DIAGNOSIS — M5412 Radiculopathy, cervical region: Secondary | ICD-10-CM | POA: Diagnosis not present

## 2021-11-09 DIAGNOSIS — M79602 Pain in left arm: Secondary | ICD-10-CM | POA: Diagnosis not present

## 2021-11-09 DIAGNOSIS — G894 Chronic pain syndrome: Secondary | ICD-10-CM | POA: Diagnosis not present

## 2021-11-10 DIAGNOSIS — N3946 Mixed incontinence: Secondary | ICD-10-CM | POA: Diagnosis not present

## 2021-12-21 DIAGNOSIS — R52 Pain, unspecified: Secondary | ICD-10-CM | POA: Diagnosis not present

## 2021-12-21 DIAGNOSIS — M542 Cervicalgia: Secondary | ICD-10-CM | POA: Diagnosis not present

## 2021-12-21 DIAGNOSIS — M79602 Pain in left arm: Secondary | ICD-10-CM | POA: Diagnosis not present

## 2021-12-21 DIAGNOSIS — Z79891 Long term (current) use of opiate analgesic: Secondary | ICD-10-CM | POA: Diagnosis not present

## 2021-12-21 DIAGNOSIS — M5412 Radiculopathy, cervical region: Secondary | ICD-10-CM | POA: Diagnosis not present

## 2021-12-21 DIAGNOSIS — G894 Chronic pain syndrome: Secondary | ICD-10-CM | POA: Diagnosis not present

## 2021-12-21 DIAGNOSIS — M79642 Pain in left hand: Secondary | ICD-10-CM | POA: Diagnosis not present

## 2022-01-18 DIAGNOSIS — G894 Chronic pain syndrome: Secondary | ICD-10-CM | POA: Diagnosis not present

## 2022-01-18 DIAGNOSIS — Z79891 Long term (current) use of opiate analgesic: Secondary | ICD-10-CM | POA: Diagnosis not present

## 2022-02-10 DIAGNOSIS — J069 Acute upper respiratory infection, unspecified: Secondary | ICD-10-CM | POA: Diagnosis not present

## 2022-02-10 DIAGNOSIS — G894 Chronic pain syndrome: Secondary | ICD-10-CM | POA: Diagnosis not present

## 2022-02-10 DIAGNOSIS — F430 Acute stress reaction: Secondary | ICD-10-CM | POA: Diagnosis not present

## 2022-02-10 DIAGNOSIS — Z23 Encounter for immunization: Secondary | ICD-10-CM | POA: Diagnosis not present

## 2022-02-15 DIAGNOSIS — M79602 Pain in left arm: Secondary | ICD-10-CM | POA: Diagnosis not present

## 2022-02-15 DIAGNOSIS — M542 Cervicalgia: Secondary | ICD-10-CM | POA: Diagnosis not present

## 2022-02-15 DIAGNOSIS — R52 Pain, unspecified: Secondary | ICD-10-CM | POA: Diagnosis not present

## 2022-02-15 DIAGNOSIS — M79642 Pain in left hand: Secondary | ICD-10-CM | POA: Diagnosis not present

## 2022-02-15 DIAGNOSIS — G894 Chronic pain syndrome: Secondary | ICD-10-CM | POA: Diagnosis not present

## 2022-02-15 DIAGNOSIS — Z79891 Long term (current) use of opiate analgesic: Secondary | ICD-10-CM | POA: Diagnosis not present

## 2022-02-15 DIAGNOSIS — M5412 Radiculopathy, cervical region: Secondary | ICD-10-CM | POA: Diagnosis not present

## 2022-02-16 DIAGNOSIS — Z111 Encounter for screening for respiratory tuberculosis: Secondary | ICD-10-CM | POA: Diagnosis not present

## 2022-03-08 DIAGNOSIS — M79642 Pain in left hand: Secondary | ICD-10-CM | POA: Diagnosis not present

## 2022-03-08 DIAGNOSIS — G894 Chronic pain syndrome: Secondary | ICD-10-CM | POA: Diagnosis not present

## 2022-03-08 DIAGNOSIS — R52 Pain, unspecified: Secondary | ICD-10-CM | POA: Diagnosis not present

## 2022-03-08 DIAGNOSIS — M79602 Pain in left arm: Secondary | ICD-10-CM | POA: Diagnosis not present

## 2022-03-08 DIAGNOSIS — M542 Cervicalgia: Secondary | ICD-10-CM | POA: Diagnosis not present

## 2022-03-08 DIAGNOSIS — Z79891 Long term (current) use of opiate analgesic: Secondary | ICD-10-CM | POA: Diagnosis not present

## 2022-03-08 DIAGNOSIS — M5412 Radiculopathy, cervical region: Secondary | ICD-10-CM | POA: Diagnosis not present

## 2022-04-05 DIAGNOSIS — R52 Pain, unspecified: Secondary | ICD-10-CM | POA: Diagnosis not present

## 2022-04-05 DIAGNOSIS — M5412 Radiculopathy, cervical region: Secondary | ICD-10-CM | POA: Diagnosis not present

## 2022-04-05 DIAGNOSIS — G894 Chronic pain syndrome: Secondary | ICD-10-CM | POA: Diagnosis not present

## 2022-04-05 DIAGNOSIS — M79642 Pain in left hand: Secondary | ICD-10-CM | POA: Diagnosis not present

## 2022-04-05 DIAGNOSIS — Z79891 Long term (current) use of opiate analgesic: Secondary | ICD-10-CM | POA: Diagnosis not present

## 2022-04-05 DIAGNOSIS — M79602 Pain in left arm: Secondary | ICD-10-CM | POA: Diagnosis not present

## 2022-04-05 DIAGNOSIS — M542 Cervicalgia: Secondary | ICD-10-CM | POA: Diagnosis not present

## 2022-05-03 DIAGNOSIS — M79642 Pain in left hand: Secondary | ICD-10-CM | POA: Diagnosis not present

## 2022-05-03 DIAGNOSIS — M5412 Radiculopathy, cervical region: Secondary | ICD-10-CM | POA: Diagnosis not present

## 2022-05-03 DIAGNOSIS — Z79891 Long term (current) use of opiate analgesic: Secondary | ICD-10-CM | POA: Diagnosis not present

## 2022-05-03 DIAGNOSIS — R52 Pain, unspecified: Secondary | ICD-10-CM | POA: Diagnosis not present

## 2022-05-03 DIAGNOSIS — G894 Chronic pain syndrome: Secondary | ICD-10-CM | POA: Diagnosis not present

## 2022-05-03 DIAGNOSIS — M542 Cervicalgia: Secondary | ICD-10-CM | POA: Diagnosis not present

## 2022-05-03 DIAGNOSIS — M79602 Pain in left arm: Secondary | ICD-10-CM | POA: Diagnosis not present

## 2022-06-09 ENCOUNTER — Encounter (HOSPITAL_COMMUNITY): Payer: Self-pay | Admitting: *Deleted

## 2022-06-09 ENCOUNTER — Other Ambulatory Visit: Payer: Self-pay

## 2022-06-09 ENCOUNTER — Emergency Department (HOSPITAL_COMMUNITY): Payer: Medicaid Other

## 2022-06-09 ENCOUNTER — Emergency Department (HOSPITAL_COMMUNITY)
Admission: EM | Admit: 2022-06-09 | Discharge: 2022-06-09 | Disposition: A | Discharge status: Home / Self Care | Payer: Medicaid Other | Attending: Emergency Medicine | Admitting: Emergency Medicine

## 2022-06-09 DIAGNOSIS — R0789 Other chest pain: Secondary | ICD-10-CM | POA: Insufficient documentation

## 2022-06-09 DIAGNOSIS — R3121 Asymptomatic microscopic hematuria: Secondary | ICD-10-CM | POA: Insufficient documentation

## 2022-06-09 DIAGNOSIS — G629 Polyneuropathy, unspecified: Secondary | ICD-10-CM | POA: Diagnosis not present

## 2022-06-09 DIAGNOSIS — R7303 Prediabetes: Secondary | ICD-10-CM | POA: Insufficient documentation

## 2022-06-09 DIAGNOSIS — R3129 Other microscopic hematuria: Secondary | ICD-10-CM | POA: Insufficient documentation

## 2022-06-09 DIAGNOSIS — R079 Chest pain, unspecified: Secondary | ICD-10-CM | POA: Diagnosis not present

## 2022-06-09 DIAGNOSIS — R202 Paresthesia of skin: Secondary | ICD-10-CM | POA: Diagnosis not present

## 2022-06-09 DIAGNOSIS — G4700 Insomnia, unspecified: Secondary | ICD-10-CM | POA: Diagnosis not present

## 2022-06-09 DIAGNOSIS — E860 Dehydration: Secondary | ICD-10-CM | POA: Diagnosis not present

## 2022-06-09 LAB — URINALYSIS, ROUTINE W REFLEX MICROSCOPIC
Bacteria, UA: NONE SEEN
Bilirubin Urine: NEGATIVE
Glucose, UA: NEGATIVE mg/dL
Ketones, ur: NEGATIVE mg/dL
Leukocytes,Ua: NEGATIVE
Nitrite: NEGATIVE
Protein, ur: NEGATIVE mg/dL
Specific Gravity, Urine: 1.012 (ref 1.005–1.030)
pH: 6 (ref 5.0–8.0)

## 2022-06-09 LAB — BASIC METABOLIC PANEL
Anion gap: 7 (ref 5–15)
BUN: 17 mg/dL (ref 6–20)
CO2: 24 mmol/L (ref 22–32)
Calcium: 8.8 mg/dL — ABNORMAL LOW (ref 8.9–10.3)
Chloride: 107 mmol/L (ref 98–111)
Creatinine, Ser: 1.15 mg/dL — ABNORMAL HIGH (ref 0.44–1.00)
GFR, Estimated: 57 mL/min — ABNORMAL LOW (ref >60)
Glucose, Bld: 92 mg/dL (ref 70–99)
Potassium: 3.7 mmol/L (ref 3.5–5.1)
Sodium: 138 mmol/L (ref 135–145)

## 2022-06-09 LAB — CBG MONITORING, ED: Glucose-Capillary: 87 mg/dL (ref 70–99)

## 2022-06-09 LAB — CBC
HCT: 36.2 % (ref 36.0–46.0)
Hemoglobin: 11.7 g/dL — ABNORMAL LOW (ref 12.0–15.0)
MCH: 30.1 pg (ref 26.0–34.0)
MCHC: 32.3 g/dL (ref 30.0–36.0)
MCV: 93.1 fL (ref 80.0–100.0)
Platelets: 197 10*3/uL (ref 150–400)
RBC: 3.89 MIL/uL (ref 3.87–5.11)
RDW: 12.3 % (ref 11.5–15.5)
WBC: 6.4 10*3/uL (ref 4.0–10.5)
nRBC: 0 % (ref 0.0–0.2)

## 2022-06-09 LAB — TROPONIN I (HIGH SENSITIVITY)
Troponin I (High Sensitivity): 2 ng/L (ref <18)
Troponin I (High Sensitivity): 2 ng/L (ref <18)

## 2022-06-09 MED ORDER — KETOROLAC TROMETHAMINE 15 MG/ML IJ SOLN
15.0000 mg | Freq: Once | INTRAMUSCULAR | Status: AC
  Administered 2022-06-09: 15 mg via INTRAVENOUS
  Filled 2022-06-09: qty 1

## 2022-06-09 MED ORDER — ONDANSETRON HCL 4 MG/2ML IJ SOLN
4.0000 mg | Freq: Once | INTRAMUSCULAR | Status: AC
  Administered 2022-06-09: 4 mg via INTRAVENOUS
  Filled 2022-06-09: qty 2

## 2022-06-09 MED ORDER — HYDROXYZINE HCL 25 MG PO TABS: 25.0000 mg | ORAL_TABLET | Freq: Every day | ORAL | 0 refills | Status: AC

## 2022-06-09 MED ORDER — LACTATED RINGERS IV BOLUS
1000.0000 mL | Freq: Once | INTRAVENOUS | Status: AC
  Administered 2022-06-09: 1000 mL via INTRAVENOUS

## 2022-06-09 MED ORDER — DIPHENHYDRAMINE HCL 50 MG/ML IJ SOLN
25.0000 mg | Freq: Once | INTRAMUSCULAR | Status: AC
  Administered 2022-06-09: 25 mg via INTRAVENOUS
  Filled 2022-06-09: qty 1

## 2022-06-09 MED ORDER — ONDANSETRON HCL 4 MG PO TABS: 4.0000 mg | ORAL_TABLET | Freq: Three times a day (TID) | ORAL | 0 refills | Status: AC | PRN

## 2022-06-09 MED ORDER — METOCLOPRAMIDE HCL 5 MG/ML IJ SOLN
10.0000 mg | Freq: Once | INTRAMUSCULAR | Status: AC
  Administered 2022-06-09: 10 mg via INTRAVENOUS
  Filled 2022-06-09: qty 2

## 2022-06-09 NOTE — ED Provider Notes (Signed)
[place] [place] EMERGENCY DEPARTMENT AT [facility] Provider Note   CSN: [id number] Arrival date & time: 06/09/22  1410     History  Chief Complaint  Patient presents with   Chest Pain    Maria Logan is a 57 y.o. female with PMH fibroids, neuropathy presents to ED with multiple complaints. She reports that she feels "weird" and "off." Has difficulty explaining symptoms. Reports she has pressure like sensation on her chest nearly all the time for a month now with occasional tightness. Intermittent burning type paresthesias in lower extremities as well. Reports difficulty sleeping. Symptoms ongoing for over a month now. Pt reports concern for diabetes. Noted to be pre-diabetic before but never diabetic. Not on any current diabetic medications. No associated dyspnea, fever, abdominal pain, vomiting, diarrhea, back pain. No radiating chest pain. No personal or family history CAD. Concerned for UTI as well. No dysuria, hematuria, frequency, hesitancy.       Home Medications Prior to Admission medications   Medication Sig Start Date End Date Taking? Authorizing Provider  hydrOXYzine (ATARAX) 25 MG tablet Take 1 tablet (25 mg total) by mouth at bedtime for 5 days. 06/09/22 06/14/22 Yes Francess Mullen L, PA-C  ondansetron (ZOFRAN) 4 MG tablet Take 1 tablet (4 mg total) by mouth every 8 (eight) hours as needed for up to 5 days for nausea or vomiting. 06/09/22 06/14/22 Yes Jadesola Poynter L, PA-C  benzonatate (TESSALON) 100 MG capsule Take 1-2 capsules (100-200 mg total) by mouth every 8 (eight) hours as needed for cough. 12/17/19   Georgetta Haber, NP  cetirizine (ZYRTEC) 10 MG tablet Take 1 tablet (10 mg total) by mouth daily. 12/17/19   Georgetta Haber, NP  estradiol (ESTRACE) 1 MG tablet Take 1 tablet (1 mg total) by mouth daily. Patient not taking: Reported on 05/26/2019 12/04/17   Adam Phenix, MD  gabapentin (NEURONTIN) 100 MG capsule Take 1 capsule (100 mg total) by mouth 3 (three)  times daily. 06/02/20   Arthor Captain, PA-C  HYDROcodone-acetaminophen (NORCO/VICODIN) 5-325 MG tablet Take 2 tablets by mouth every 4 (four) hours as needed. 05/27/20   Wynetta Fines, MD  meclizine (ANTIVERT) 12.5 MG tablet Take 1 tablet (12.5 mg total) by mouth 3 (three) times daily as needed for dizziness. Patient not taking: Reported on 01/17/2019 05/10/18   Elvina Sidle, MD  predniSONE (DELTASONE) 20 MG tablet Two daily with food Patient not taking: Reported on 05/26/2019 08/18/18   Elvina Sidle, MD  sertraline (ZOLOFT) 50 MG tablet Take 50 mg by mouth daily.    [provider]  triamterene-hydrochlorothiazide (MAXZIDE-25) 37.5-25 MG tablet Take 1 tablet by mouth daily. Patient not taking: Reported on 05/26/2019 08/18/18   Elvina Sidle, MD  Vitamin D, Ergocalciferol, (DRISDOL) 1.25 MG (50000 UNIT) CAPS capsule Take 50,000 Units by mouth every Friday.    [provider]      Allergies    Patient has no known allergies.    Review of Systems   Review of Systems  All other systems reviewed and are negative.   Physical Exam Updated Vital Signs BP 121/70   Pulse 62   Temp 98.4 F (36.9 C)   Resp 11   Ht 5\' 6"  (1.676 m)   Wt 92.2 kg   LMP 12/09/2014 (Approximate)   SpO2 99%   BMI 32.80 kg/m  Physical Exam Vitals and nursing note reviewed.  Constitutional:      General: She is not in acute distress.  Appearance: Normal appearance. She is not ill-appearing, toxic-appearing or diaphoretic.  HENT:     Head: Normocephalic and atraumatic.     Mouth/Throat:     Mouth: Mucous membranes are dry.  Eyes:     Extraocular Movements: Extraocular movements intact.     Conjunctiva/sclera: Conjunctivae normal.     Pupils: Pupils are equal, round, and reactive to light.  Neck:     Vascular: No JVD.  Cardiovascular:     Rate and Rhythm: Normal rate and regular rhythm.     Heart sounds: Normal heart sounds. No murmur heard.    No S3 or S4 sounds.  Pulmonary:      Effort: Pulmonary effort is normal. No tachypnea.     Breath sounds: Normal breath sounds. No stridor. No decreased breath sounds, wheezing, rhonchi or rales.  Chest:     Chest wall: No mass, deformity, tenderness, crepitus or edema.  Abdominal:     General: Abdomen is flat.     Palpations: Abdomen is soft. There is no mass.     Tenderness: There is no abdominal tenderness. There is no guarding or rebound.  Musculoskeletal:        General: Normal range of motion.     Cervical back: Normal range of motion and neck supple.     Right lower leg: No tenderness. No edema.     Left lower leg: No tenderness. No edema.  Skin:    General: Skin is warm and dry.     Capillary Refill: Capillary refill takes 2 to 3 seconds.  Neurological:     General: No focal deficit present.     Mental Status: She is alert and oriented to person, place, and time.     GCS: GCS eye subscore is 4. GCS verbal subscore is 5. GCS motor subscore is 6.     Cranial Nerves: Cranial nerves 2-12 are intact.     Sensory: Sensation is intact.     Motor: Motor function is intact.     Coordination: Coordination is intact.  Psychiatric:        Mood and Affect: Mood is anxious. Affect is flat.        Speech: Speech normal.        Behavior: Behavior is cooperative.     ED Results / Procedures / Treatments   Labs (all labs ordered are listed, but only abnormal results are displayed) Labs Reviewed  BASIC METABOLIC PANEL - Abnormal; Notable for the following components:      Result Value   Creatinine, Ser 1.15 (*)    Calcium 8.8 (*)    GFR, Estimated 57 (*)    All other components within normal limits  CBC - Abnormal; Notable for the following components:   Hemoglobin 11.7 (*)    All other components within normal limits  URINALYSIS, ROUTINE W REFLEX MICROSCOPIC - Abnormal; Notable for the following components:   Color, Urine STRAW (*)    Hgb urine dipstick MODERATE (*)    All other components within normal limits   HEMOGLOBIN A1C  CBG MONITORING, ED  TROPONIN I (HIGH SENSITIVITY)  TROPONIN I (HIGH SENSITIVITY)    EKG EKG Interpretation  Date/Time:  Thursday Jun 09 2022 14:32:19 EDT Ventricular Rate:  68 PR Interval:  130 QRS Duration: 93 QT Interval:  372 QTC Calculation: 396 R Axis:   31 Text Interpretation: Sinus rhythm Low voltage, precordial leads Abnormal R-wave progression, early transition No significant change since last tracing Confirmed by Vivien Rossetti ([id number])  on 06/09/2022 2:38:34 PM  Radiology DG Chest 2 View  Result Date: 06/09/2022 CLINICAL DATA:  Chest pain. EXAM: CHEST - 2 VIEW COMPARISON:  01/08/2021. FINDINGS: Clear lungs. Stable cardiac and mediastinal contours. No pleural effusion or pneumothorax. Visualized bones and upper abdomen are unremarkable. IMPRESSION: No evidence of acute cardiopulmonary disease. Electronically Signed   By: Orvan Falconer M.D.   On: 06/09/2022 14:52    Procedures Procedures    Medications Ordered in ED Medications  lactated ringers bolus 1,000 mL (0 mLs Intravenous Stopped 06/09/22 1817)  ketorolac (TORADOL) 15 MG/ML injection 15 mg (15 mg Intravenous Given 06/09/22 1548)  ondansetron (ZOFRAN) injection 4 mg (4 mg Intravenous Given 06/09/22 1548)  metoCLOPramide (REGLAN) injection 10 mg (10 mg Intravenous Given 06/09/22 1817)  diphenhydrAMINE (BENADRYL) injection 25 mg (25 mg Intravenous Given 06/09/22 1817)  lactated ringers bolus 1,000 mL (1,000 mLs Intravenous New Bag/Given 06/09/22 1817)    ED Course/ Medical Decision Making/ A&P                             Medical Decision Making Amount and/or Complexity of Data Reviewed Labs: ordered. Decision-making details documented in ED Course. Radiology: ordered. Decision-making details documented in ED Course. ECG/medicine tests: ordered. Decision-making details documented in ED Course.  Risk Prescription drug management.   Medical Decision Making:   WYLEE RIVERE is a 57 y.o.  female who presented to the ED today with chest pain detailed above.    Patient placed on continuous vitals and telemetry monitoring while in ED which was reviewed periodically.  Complete initial physical exam performed, notably the patient  was in NAD. RRR, LCTA. No LE edema or tenderness. Abdomen soft and nontender. Nontoxic appearing. Nonfocal neuro exam. Flat affect, appears anxious. Alert and oriented with decision making capacity.    Reviewed and confirmed nursing documentation for past medical history, family history, social history.    Initial Assessment:   With the patient's presentation of chest pain, the emergent differential diagnosis of chest pain includes: Acute coronary syndrome, pericarditis, aortic dissection, pulmonary embolism, tension pneumothorax, and esophageal rupture.  I do not believe the patient has an emergent cause of chest pain, other urgent/non-acute considerations include, but are not limited to: chronic angina, aortic stenosis, cardiomyopathy, myocarditis, mitral valve prolapse, pulmonary hypertension, hypertrophic obstructive cardiomyopathy (HOCM), aortic insufficiency, right ventricular hypertrophy, pneumonia, pleuritis, bronchitis, pneumothorax, tumor, gastroesophageal reflux disease (GERD), esophageal spasm, Mallory-Weiss syndrome, peptic ulcer disease, biliary disease, pancreatitis, functional gastrointestinal pain, cervical or thoracic disk disease or arthritis, shoulder arthritis, costochondritis, subacromial bursitis, anxiety or panic attack, herpes zoster, breast disorders, chest wall tumors, thoracic outlet syndrome, mediastinitis.    Initial Plan:  Screening labs including CBC and Metabolic panel to evaluate for infectious or metabolic etiology of disease.  Urinalysis with reflex culture ordered to evaluate for UTI or relevant urologic/nephrologic pathology.  CXR to evaluate for structural/infectious intrathoracic pathology.  EKG and troponin to evaluate for  cardiac pathology Symptomatic treatment Objective evaluation as reviewed   Initial Study Results:   Laboratory  All laboratory results reviewed without evidence of clinically relevant pathology.   Exceptions include: Creatinine 1.15, GFR 57, UA with moderate amounts of hemoglobin  EKG EKG was reviewed independently. ST segments without concerns for elevations.   EKG: normal sinus rhythm.   Radiology:  All images reviewed independently. Agree with radiology report at this time.   DG Chest 2 View  Result Date: 06/09/2022 CLINICAL DATA:  Chest  pain. EXAM: CHEST - 2 VIEW COMPARISON:  01/08/2021. FINDINGS: Clear lungs. Stable cardiac and mediastinal contours. No pleural effusion or pneumothorax. Visualized bones and upper abdomen are unremarkable. IMPRESSION: No evidence of acute cardiopulmonary disease. Electronically Signed   By: Orvan Falconer M.D.   On: 06/09/2022 14:52      Final Assessment and Plan:   57 year old female presents to the ED with multiple complaints as above.  Most notably, chest pain.  This has been ongoing for at least a month now.  Chest pain does not radiate.  Not associated with diaphoresis, syncope, shortness of breath.  No history of cardiac issues.  Has been followed by cardiologist before for chronic chest pain and had a normal stress test.  No family history of CAD.  EKG normal sinus rhythm without acute ST-T changes.  Troponin negative x 2.  Chest x-ray normal.  Patient with multiple other complaints including paresthesias to both legs.  She does appear very anxious and expressing concern for diabetes, urinary tract infection, etc.  A1c ordered but did not result during ED stay.  Patient to follow-up with PCP regarding this.  Glucose here 92.  Patient nontoxic-appearing.  Symptoms are very vague and she complains of feeling "off."  2 fluid boluses and multiple medications given with improvement in symptoms.  Creatinine at 1.15, only slightly elevated from previous.  No  AKI.  No significant electrolyte disturbance.  UA with microscopic hematuria but no signs of infection.  Patient reports this has happened before and she was seen by urology.  She does seem very anxious regarding this finding as well so we will provide with additional urology follow-up for recheck.  She states that she can follow-up with her previous cardiologist regarding chronic chest pain.  Will also have her follow-up closely with primary care.  Provided antiemetics for home as well as hydroxyzine to help with reported insomnia.  Patient agreeable with plan.  Strict ED return precautions given, all questions answered, and stable for discharge.   Clinical Impression:  1. Dehydration   2. Microscopic hematuria   3. Atypical chest pain      Discharge           Final Clinical Impression(s) / ED Diagnoses Final diagnoses:  Dehydration  Microscopic hematuria  Atypical chest pain    Rx / DC Orders ED Discharge Orders          Ordered    ondansetron (ZOFRAN) 4 MG tablet  Every 8 hours PRN        06/09/22 1939    hydrOXYzine (ATARAX) 25 MG tablet  Daily at bedtime        06/09/22 1939              Richardson Dopp 06/09/22 1949    Vanetta Mulders, MD 06/11/22 2329

## 2022-06-09 NOTE — Discharge Instructions (Addendum)
Thank you for letting us take care of you today.  We gave you fluids and multiple medications in the ED to help with your symptoms.  Overall, your workup was reassuring.  Your EKG, cardiac enzymes, and chest x-ray were normal.  Follow-up with your PCP within the next week.  We ordered an A1c which measures your blood sugar over the last 3 months but this has not yet returned and may not return until tomorrow.  They can follow-up on this result and prescribing medications for diabetes if indicated.  Your urine did not appear infected but did show microscopic blood.  Please follow-up with urology for further evaluation of this.  In regards to your chest pain, follow-up with your previous cardiologist to discuss any continued symptoms.  For any new or worsening complaints, please return to the nearest ED for reevaluation.

## 2022-06-09 NOTE — ED Triage Notes (Signed)
Pressure in her chest about a month which increases with activity. Pt also reports burning pain in legs

## 2022-06-10 LAB — HEMOGLOBIN A1C
Hgb A1c MFr Bld: 5.6 % (ref 4.8–5.6)
Mean Plasma Glucose: 114 mg/dL

## 2022-06-14 ENCOUNTER — Encounter (HOSPITAL_BASED_OUTPATIENT_CLINIC_OR_DEPARTMENT_OTHER): Payer: Self-pay

## 2022-06-14 ENCOUNTER — Emergency Department (HOSPITAL_BASED_OUTPATIENT_CLINIC_OR_DEPARTMENT_OTHER)
Admission: EM | Admit: 2022-06-14 | Discharge: 2022-06-14 | Disposition: A | Discharge status: Home / Self Care | Payer: Medicaid Other | Attending: Emergency Medicine | Admitting: Emergency Medicine

## 2022-06-14 ENCOUNTER — Other Ambulatory Visit: Payer: Self-pay

## 2022-06-14 ENCOUNTER — Emergency Department (HOSPITAL_BASED_OUTPATIENT_CLINIC_OR_DEPARTMENT_OTHER): Payer: Medicaid Other | Admitting: Radiology

## 2022-06-14 ENCOUNTER — Emergency Department (HOSPITAL_BASED_OUTPATIENT_CLINIC_OR_DEPARTMENT_OTHER): Payer: Medicaid Other

## 2022-06-14 ENCOUNTER — Other Ambulatory Visit (HOSPITAL_BASED_OUTPATIENT_CLINIC_OR_DEPARTMENT_OTHER): Payer: Self-pay

## 2022-06-14 DIAGNOSIS — M79605 Pain in left leg: Secondary | ICD-10-CM | POA: Diagnosis not present

## 2022-06-14 DIAGNOSIS — Z79899 Other long term (current) drug therapy: Secondary | ICD-10-CM | POA: Insufficient documentation

## 2022-06-14 DIAGNOSIS — R3129 Other microscopic hematuria: Secondary | ICD-10-CM

## 2022-06-14 DIAGNOSIS — R1030 Lower abdominal pain, unspecified: Secondary | ICD-10-CM | POA: Diagnosis not present

## 2022-06-14 DIAGNOSIS — I10 Essential (primary) hypertension: Secondary | ICD-10-CM | POA: Diagnosis not present

## 2022-06-14 DIAGNOSIS — R079 Chest pain, unspecified: Secondary | ICD-10-CM | POA: Insufficient documentation

## 2022-06-14 DIAGNOSIS — R0789 Other chest pain: Secondary | ICD-10-CM | POA: Diagnosis not present

## 2022-06-14 DIAGNOSIS — R1032 Left lower quadrant pain: Secondary | ICD-10-CM | POA: Diagnosis not present

## 2022-06-14 LAB — COMPREHENSIVE METABOLIC PANEL
ALT: 16 U/L (ref 0–44)
AST: 19 U/L (ref 15–41)
Albumin: 4 g/dL (ref 3.5–5.0)
Alkaline Phosphatase: 58 U/L (ref 38–126)
Anion gap: 8 (ref 5–15)
BUN: 10 mg/dL (ref 6–20)
CO2: 25 mmol/L (ref 22–32)
Calcium: 9.1 mg/dL (ref 8.9–10.3)
Chloride: 107 mmol/L (ref 98–111)
Creatinine, Ser: 0.96 mg/dL (ref 0.44–1.00)
GFR, Estimated: >60 mL/min (ref >60)
Glucose, Bld: 91 mg/dL (ref 70–99)
Potassium: 4.1 mmol/L (ref 3.5–5.1)
Sodium: 140 mmol/L (ref 135–145)
Total Bilirubin: 0.3 mg/dL (ref 0.3–1.2)
Total Protein: 6.8 g/dL (ref 6.5–8.1)

## 2022-06-14 LAB — CBC
HCT: 36.1 % (ref 36.0–46.0)
Hemoglobin: 11.6 g/dL — ABNORMAL LOW (ref 12.0–15.0)
MCH: 29.5 pg (ref 26.0–34.0)
MCHC: 32.1 g/dL (ref 30.0–36.0)
MCV: 91.9 fL (ref 80.0–100.0)
Platelets: 193 10*3/uL (ref 150–400)
RBC: 3.93 MIL/uL (ref 3.87–5.11)
RDW: 12.4 % (ref 11.5–15.5)
WBC: 5.1 10*3/uL (ref 4.0–10.5)
nRBC: 0 % (ref 0.0–0.2)

## 2022-06-14 LAB — URINALYSIS, ROUTINE W REFLEX MICROSCOPIC
Bacteria, UA: NONE SEEN
Bilirubin Urine: NEGATIVE
Glucose, UA: NEGATIVE mg/dL
Ketones, ur: NEGATIVE mg/dL
Leukocytes,Ua: NEGATIVE
Nitrite: NEGATIVE
Protein, ur: NEGATIVE mg/dL
Specific Gravity, Urine: 1.011 (ref 1.005–1.030)
pH: 7 (ref 5.0–8.0)

## 2022-06-14 LAB — TROPONIN I (HIGH SENSITIVITY): Troponin I (High Sensitivity): <2 ng/L (ref <18)

## 2022-06-14 LAB — LIPASE, BLOOD: Lipase: 24 U/L (ref 11–51)

## 2022-06-14 MED ORDER — IOHEXOL 300 MG/ML  SOLN
100.0000 mL | Freq: Once | INTRAMUSCULAR | Status: AC | PRN
  Administered 2022-06-14: 85 mL via INTRAVENOUS

## 2022-06-14 NOTE — ED Notes (Signed)
Patient transported to X-ray 

## 2022-06-14 NOTE — ED Notes (Signed)
Pt taken to restroom by wheelchair.

## 2022-06-14 NOTE — ED Provider Notes (Signed)
[place] EMERGENCY DEPARTMENT AT [facility] Provider Note   CSN: [id number] Arrival date & time: 06/14/22  1202     History  Chief Complaint  Patient presents with   Chest Pain    Maria Logan is a 57 y.o. female.  Patient is a 57 year old female with a past medical history of hypertension presenting to the emergency department with multiple complaints including chest pain, abdominal pain and left leg pain.  Patient states that she has had chest pain on and off for the last month.  She states that it feels like a pressure and stabbing type of pain in her mid chest.  She states that nothing seems to bring it on or make it get worse and that it comes and goes.  She is unable to specify how long the symptoms last for.  She states that she has some associated shortness of breath.  She states that she did have a stress test by cardiology several years ago that was normal.  She denies any recent hospitalizations or surgery, recent long travel in the car or plane, hormone use or cancer history.  As far as her abdominal pain she states that she has been having on and off burning type of pain across her lower abdomen.  She states that she has urinary frequency.  She states that she is been told that she has blood in her urine in the past but has never seen a urologist.  She states that she has associated nausea but denies any vomiting.  She denies any diarrhea or constipation.  Patient states for the last several weeks she has also had pain in her left leg.  She states that it feels like a pins-and-needles and burning type of pain in the bottom of her left foot and radiates up the lateral side of her left leg into her low back.  She denies any lower extremity swelling.  She denies any numbness or weakness.  The history is provided by the patient.  Chest Pain      Home Medications Prior to Admission medications   Medication Sig Start Date End Date Taking? Authorizing Provider  naproxen  (NAPROSYN) 500 MG tablet Take by mouth. 05/25/22  Yes [provider]  benzonatate (TESSALON) 100 MG capsule Take 1-2 capsules (100-200 mg total) by mouth every 8 (eight) hours as needed for cough. 12/17/19   Georgetta Haber, NP  cetirizine (ZYRTEC) 10 MG tablet Take 1 tablet (10 mg total) by mouth daily. 12/17/19   Georgetta Haber, NP  estradiol (ESTRACE) 1 MG tablet Take 1 tablet (1 mg total) by mouth daily. Patient not taking: Reported on 05/26/2019 12/04/17   Adam Phenix, MD  gabapentin (NEURONTIN) 100 MG capsule Take 1 capsule (100 mg total) by mouth 3 (three) times daily. 06/02/20   Arthor Captain, PA-C  HYDROcodone-acetaminophen (NORCO/VICODIN) 5-325 MG tablet Take 2 tablets by mouth every 4 (four) hours as needed. 05/27/20   Wynetta Fines, MD  hydrOXYzine (ATARAX) 25 MG tablet Take 1 tablet (25 mg total) by mouth at bedtime for 5 days. 06/09/22 06/14/22  Gowens, Lawrence Marseilles, PA-C  meclizine (ANTIVERT) 12.5 MG tablet Take 1 tablet (12.5 mg total) by mouth 3 (three) times daily as needed for dizziness. Patient not taking: Reported on 01/17/2019 05/10/18   Elvina Sidle, MD  ondansetron (ZOFRAN) 4 MG tablet Take 1 tablet (4 mg total) by mouth every 8 (eight) hours as needed for up to 5 days for nausea or  vomiting. 06/09/22 06/14/22  Gowens, Lawrence Marseilles, PA-C  oxyCODONE-acetaminophen (PERCOCET) 10-325 MG tablet Take 1 tablet by mouth every 8 (eight) hours.    [provider]  predniSONE (DELTASONE) 20 MG tablet Two daily with food Patient not taking: Reported on 05/26/2019 08/18/18   Elvina Sidle, MD  sertraline (ZOLOFT) 50 MG tablet Take 50 mg by mouth daily.    [provider]  triamterene-hydrochlorothiazide (MAXZIDE-25) 37.5-25 MG tablet Take 1 tablet by mouth daily. Patient not taking: Reported on 05/26/2019 08/18/18   Elvina Sidle, MD  Vitamin D, Ergocalciferol, (DRISDOL) 1.25 MG (50000 UNIT) CAPS capsule Take 50,000 Units by mouth every Friday.    [provider]      Allergies    Patient has no known allergies.    Review of Systems   Review of Systems  Cardiovascular:  Positive for chest pain.    Physical Exam Updated Vital Signs BP 120/82   Pulse (!) 59   Temp 97.6 F (36.4 C) (Axillary)   Resp 16   Ht 5\' 6"  (1.676 m)   Wt 92.2 kg   LMP 12/09/2014 (Approximate)   SpO2 100%   BMI 32.81 kg/m  Physical Exam Vitals and nursing note reviewed.  Constitutional:      General: She is not in acute distress.    Appearance: She is well-developed.  HENT:     Head: Normocephalic and atraumatic.  Eyes:     Extraocular Movements: Extraocular movements intact.  Cardiovascular:     Rate and Rhythm: Normal rate and regular rhythm.     Pulses:          Radial pulses are 2+ on the right side and 2+ on the left side.       Dorsalis pedis pulses are 2+ on the right side and 2+ on the left side.     Heart sounds: Normal heart sounds.  Pulmonary:     Effort: Pulmonary effort is normal.     Breath sounds: Normal breath sounds.  Chest:     Chest wall: No tenderness.  Abdominal:     Palpations: Abdomen is soft.     Tenderness: There is abdominal tenderness (Suprapubic). There is no guarding or rebound.     Comments: No CVA tenderness bilaterally  Musculoskeletal:        General: Normal range of motion.     Cervical back: Normal range of motion and neck supple.     Right lower leg: No edema.     Left lower leg: No edema.     Comments: Back tenderness, left-sided proximal lumbar paraspinal muscle tenderness to palpation, negative straight leg raise bilaterally  Skin:    General: Skin is warm and dry.  Neurological:     General: No focal deficit present.     Mental Status: She is alert and oriented to person, place, and time.     Motor: No weakness.  Psychiatric:        Mood and Affect: Mood normal.        Behavior: Behavior normal.     ED Results / Procedures / Treatments   Labs (all labs ordered are listed, but only  abnormal results are displayed) Labs Reviewed  CBC - Abnormal; Notable for the following components:      Result Value   Hemoglobin 11.6 (*)    All other components within normal limits  URINALYSIS, ROUTINE W REFLEX MICROSCOPIC - Abnormal; Notable for the following components:   Hgb urine dipstick SMALL (*)  All other components within normal limits  COMPREHENSIVE METABOLIC PANEL  LIPASE, BLOOD  TROPONIN I (HIGH SENSITIVITY)    EKG EKG Interpretation  Date/Time:  Tuesday June 14 2022 12:13:59 EDT Ventricular Rate:  57 PR Interval:  130 QRS Duration: 88 QT Interval:  390 QTC Calculation: 379 R Axis:   27 Text Interpretation: Sinus bradycardia Low voltage QRS Cannot rule out Anterior infarct , age undetermined Abnormal ECG No significant change since last tracing Confirmed by Elayne Snare ([id number]) on 06/14/2022 12:54:27 PM  Radiology CT ABDOMEN PELVIS W CONTRAST  Result Date: 06/14/2022 CLINICAL DATA:  Left lower quadrant abdominal pain. EXAM: CT ABDOMEN AND PELVIS WITH CONTRAST TECHNIQUE: Multidetector CT imaging of the abdomen and pelvis was performed using the standard protocol following bolus administration of intravenous contrast. RADIATION DOSE REDUCTION: This exam was performed according to the departmental dose-optimization program which includes automated exposure control, adjustment of the mA and/or kV according to patient size and/or use of iterative reconstruction technique. CONTRAST:  85mL OMNIPAQUE IOHEXOL 300 MG/ML  SOLN COMPARISON:  CT 06/28/2021 and older FINDINGS: Lower chest: There is some linear opacity along the lung bases likely scar or atelectasis. No pleural effusion. Small hiatal hernia. Hepatobiliary: No focal liver abnormality is seen. No gallstones, gallbladder wall thickening, or biliary dilatation. Patent portal vein. Pancreas: Unremarkable. No pancreatic ductal dilatation or surrounding inflammatory changes. Spleen: Normal in size without focal abnormality.  Adrenals/Urinary Tract: The adrenal glands are preserved. The right kidney is slightly malrotated, congenital. There are some small low-attenuation lesions along each kidney. There is one larger focus on the left towards the upper pole anterior measuring 3.9 cm in diameter with Hounsfield units of 11. 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 No specific imaging follow-up. No collecting system dilatation. Preserved contours of the urinary bladder. Stomach/Bowel: No oral contrast. Stomach is underdistended. Small bowel is nondilated. Scattered colonic stool identified in the nondilated colon. Overall mild-to-moderate stool burden. Normal appendix in the right hemipelvis. Vascular/Lymphatic: Aortic atherosclerosis. No enlarged abdominal or pelvic lymph nodes. Reproductive: Status post hysterectomy. No adnexal masses. Other: No abdominal wall hernia or abnormality. No abdominopelvic ascites. Musculoskeletal: Mild degenerative changes. IMPRESSION: No bowel obstruction, free air or free fluid. Scattered stool. Normal appendix. Small hiatal hernia Electronically Signed   By: Karen Kays M.D.   On: 06/14/2022 14:46   DG Chest 2 View  Result Date: 06/14/2022 CLINICAL DATA:  Chest pain EXAM: CHEST - 2 VIEW COMPARISON:  06/09/2022 FINDINGS: The heart size and mediastinal contours are within normal limits. Both lungs are clear. The visualized skeletal structures are unremarkable. IMPRESSION: No active cardiopulmonary disease. Electronically Signed   By: Judie Petit.  Shick M.D.   On: 06/14/2022 13:12    Procedures Procedures    Medications Ordered in ED Medications  iohexol (OMNIPAQUE) 300 MG/ML solution 100 mL (85 mLs Intravenous Contrast Given 06/14/22 1419)    ED Course/ Medical Decision Making/ A&P                             Medical Decision Making This patient presents to the ED with chief complaint(s) of chest pain, abdominal pain, left leg pain with pertinent past medical history of hypertension  which further complicates the presenting complaint. The complaint involves an extensive differential diagnosis and also carries with it a high risk of complications and morbidity.    The differential diagnosis includes yes, arrhythmia, anemia, pneumonia, pneumothorax, pulmonary edema, pleural effusion, electrolyte abnormality,  dehydration, UTI, other intra-abdominal infection, considering peripheral neuropathy or sciatica, muscle strain or spasm, patient is low risk by Wells criteria making PE unlikely, logic deficits, no saddle anesthesia no loss of bowel or bladder function making cauda equina or epidural abscess unlikely  Additional history obtained: Additional history obtained from family Records reviewed previous admission documents and recent ED records  ED Course and Reassessment: On patient's arrival to the emergency department she was initially evaluated by triage and had EKG, labs and chest x-ray performed.  Patient's EKG showed no acute ischemic changes initial troponin was negative.  Symptoms have been ongoing for approximately a month and single troponin is sufficient.  Chest x-ray showed no obvious acute disease.  Patient did have hematuria on last ED visit and has not had a workup to evaluate for cause of hematuria.  She will repeat urine performed here as well as a CT to evaluate for possible kidney stone or other intra-abdominal infection contributing to her symptoms.  As far as her leg pain, she has no leg swelling or evidence of DVT and no trauma making a fracture dislocation unlikely, considering possible neuropathic pain is recommended outpatient follow-up for this.  Independent labs interpretation:  The following labs were independently interpreted: trace hematuria, otherwise within normal range  Independent visualization of imaging: - I independently visualized the following imaging with scope of interpretation limited to determining acute life threatening conditions related to  emergency care: CXR, CTAP, which revealed no acute abnormalities  Consultation: - Consulted or discussed management/test interpretation w/ external professional: N/A  Consideration for admission or further workup: Patient has no emergent conditions requiring admission or further work-up at this time and is stable for discharge home with primary care, cardiology, urology follow-up  Social Determinants of health: N/A    Amount and/or Complexity of Data Reviewed Labs: ordered. Radiology: ordered.  Risk Prescription drug management.          Final Clinical Impression(s) / ED Diagnoses Final diagnoses:  Nonspecific chest pain  Left leg pain  Other microscopic hematuria  Lower abdominal pain    Rx / DC Orders ED Discharge Orders     None         Rexford Maus, DO 06/14/22 1518

## 2022-06-14 NOTE — ED Triage Notes (Signed)
Patient here POV from Home.  Endorses Chest pain for approximately 1 Month. Seen for same 2-3 Days ago. Also notes Pain to Left Leg and more recently today some burning in Lower ABD.  Some SOB. Some nausea. No emesis.   NAD Noted during Triage. A&Ox4. GCS 15. Ambulatory.

## 2022-06-14 NOTE — ED Notes (Signed)
Discharge instructions, pain management, and follow up care with urology and cardiology reviewed and explained, pt verbalized understanding and had no further questions on d/c.

## 2022-06-14 NOTE — Discharge Instructions (Signed)
You were seen in the emergency department for your chest pain, abdominal pain and leg pain.  Your workup showed no signs of heart attack or abnormal heart rhythms, no obvious infection or cause of your symptoms.  You did have some blood in your urine which may be causing some bladder irritation and some of your lower abdominal pain.  You can follow-up with urology to have further workup and evaluation of the blood in your urine.  You should follow-up with your cardiologist regarding your chest pain to see if you need another stress test or further workup to evaluate for causes of your chest pain.  Your leg pain sounds to be like neuropathic pain and you may have a pinched nerve in your back.  You can take Tylenol and Motrin as needed for pain and both be taken up to every 6 hours and you can follow-up with your primary doctor to have your symptoms rechecked.  You should return to the emergency department if you have significantly worsening pain, fevers, repetitive vomiting, weakness in your leg and you are unable to walk, you are unable to urinate or if you have any other new or concerning symptoms.

## 2022-06-17 DIAGNOSIS — Z780 Asymptomatic menopausal state: Secondary | ICD-10-CM | POA: Diagnosis not present

## 2022-06-17 DIAGNOSIS — Z87898 Personal history of other specified conditions: Secondary | ICD-10-CM | POA: Diagnosis not present

## 2022-06-17 DIAGNOSIS — R030 Elevated blood-pressure reading, without diagnosis of hypertension: Secondary | ICD-10-CM | POA: Diagnosis not present

## 2022-06-17 DIAGNOSIS — R079 Chest pain, unspecified: Secondary | ICD-10-CM | POA: Diagnosis not present

## 2022-06-17 DIAGNOSIS — Z1231 Encounter for screening mammogram for malignant neoplasm of breast: Secondary | ICD-10-CM | POA: Diagnosis not present

## 2022-06-17 DIAGNOSIS — Z789 Other specified health status: Secondary | ICD-10-CM | POA: Diagnosis not present

## 2022-06-17 DIAGNOSIS — Z0130 Encounter for examination of blood pressure without abnormal findings: Secondary | ICD-10-CM | POA: Diagnosis not present

## 2022-06-17 DIAGNOSIS — Z6832 Body mass index (BMI) 32.0-32.9, adult: Secondary | ICD-10-CM | POA: Diagnosis not present

## 2022-06-17 DIAGNOSIS — M549 Dorsalgia, unspecified: Secondary | ICD-10-CM | POA: Diagnosis not present

## 2022-06-21 DIAGNOSIS — M79602 Pain in left arm: Secondary | ICD-10-CM | POA: Diagnosis not present

## 2022-06-21 DIAGNOSIS — M5412 Radiculopathy, cervical region: Secondary | ICD-10-CM | POA: Diagnosis not present

## 2022-06-21 DIAGNOSIS — Z79891 Long term (current) use of opiate analgesic: Secondary | ICD-10-CM | POA: Diagnosis not present

## 2022-06-21 DIAGNOSIS — M79642 Pain in left hand: Secondary | ICD-10-CM | POA: Diagnosis not present

## 2022-06-21 DIAGNOSIS — Z79899 Other long term (current) drug therapy: Secondary | ICD-10-CM | POA: Diagnosis not present

## 2022-06-21 DIAGNOSIS — G894 Chronic pain syndrome: Secondary | ICD-10-CM | POA: Diagnosis not present

## 2022-06-21 DIAGNOSIS — R52 Pain, unspecified: Secondary | ICD-10-CM | POA: Diagnosis not present

## 2022-06-21 DIAGNOSIS — M542 Cervicalgia: Secondary | ICD-10-CM | POA: Diagnosis not present

## 2022-06-30 DIAGNOSIS — M5440 Lumbago with sciatica, unspecified side: Secondary | ICD-10-CM | POA: Diagnosis not present

## 2022-07-18 ENCOUNTER — Encounter (HOSPITAL_BASED_OUTPATIENT_CLINIC_OR_DEPARTMENT_OTHER): Payer: Self-pay | Admitting: Emergency Medicine

## 2022-07-18 ENCOUNTER — Emergency Department (HOSPITAL_BASED_OUTPATIENT_CLINIC_OR_DEPARTMENT_OTHER): Payer: Medicaid Other

## 2022-07-18 ENCOUNTER — Emergency Department (HOSPITAL_BASED_OUTPATIENT_CLINIC_OR_DEPARTMENT_OTHER): Payer: Medicaid Other | Admitting: Radiology

## 2022-07-18 ENCOUNTER — Inpatient Hospital Stay (HOSPITAL_BASED_OUTPATIENT_CLINIC_OR_DEPARTMENT_OTHER)
Admission: EM | Admit: 2022-07-18 | Discharge: 2022-07-21 | DRG: 690 | Disposition: A | Discharge status: Home / Self Care | Payer: Medicaid Other | Attending: Family Medicine | Admitting: Family Medicine

## 2022-07-18 ENCOUNTER — Other Ambulatory Visit: Payer: Self-pay

## 2022-07-18 DIAGNOSIS — M47815 Spondylosis without myelopathy or radiculopathy, thoracolumbar region: Secondary | ICD-10-CM | POA: Diagnosis present

## 2022-07-18 DIAGNOSIS — Z90710 Acquired absence of both cervix and uterus: Secondary | ICD-10-CM | POA: Diagnosis not present

## 2022-07-18 DIAGNOSIS — Z87440 Personal history of urinary (tract) infections: Secondary | ICD-10-CM

## 2022-07-18 DIAGNOSIS — R109 Unspecified abdominal pain: Secondary | ICD-10-CM | POA: Diagnosis not present

## 2022-07-18 DIAGNOSIS — R509 Fever, unspecified: Secondary | ICD-10-CM | POA: Diagnosis not present

## 2022-07-18 DIAGNOSIS — B9620 Unspecified Escherichia coli [E. coli] as the cause of diseases classified elsewhere: Secondary | ICD-10-CM | POA: Diagnosis not present

## 2022-07-18 DIAGNOSIS — Z833 Family history of diabetes mellitus: Secondary | ICD-10-CM | POA: Diagnosis not present

## 2022-07-18 DIAGNOSIS — R9431 Abnormal electrocardiogram [ECG] [EKG]: Secondary | ICD-10-CM | POA: Diagnosis not present

## 2022-07-18 DIAGNOSIS — N3010 Interstitial cystitis (chronic) without hematuria: Secondary | ICD-10-CM | POA: Diagnosis present

## 2022-07-18 DIAGNOSIS — N12 Tubulo-interstitial nephritis, not specified as acute or chronic: Secondary | ICD-10-CM | POA: Diagnosis not present

## 2022-07-18 DIAGNOSIS — K449 Diaphragmatic hernia without obstruction or gangrene: Secondary | ICD-10-CM | POA: Diagnosis not present

## 2022-07-18 DIAGNOSIS — F32A Depression, unspecified: Secondary | ICD-10-CM | POA: Diagnosis not present

## 2022-07-18 DIAGNOSIS — M5126 Other intervertebral disc displacement, lumbar region: Secondary | ICD-10-CM | POA: Diagnosis present

## 2022-07-18 DIAGNOSIS — Z1152 Encounter for screening for COVID-19: Secondary | ICD-10-CM | POA: Diagnosis not present

## 2022-07-18 DIAGNOSIS — M4804 Spinal stenosis, thoracic region: Secondary | ICD-10-CM | POA: Diagnosis present

## 2022-07-18 DIAGNOSIS — N281 Cyst of kidney, acquired: Secondary | ICD-10-CM | POA: Diagnosis not present

## 2022-07-18 DIAGNOSIS — R059 Cough, unspecified: Secondary | ICD-10-CM | POA: Diagnosis not present

## 2022-07-18 DIAGNOSIS — Z79899 Other long term (current) drug therapy: Secondary | ICD-10-CM

## 2022-07-18 DIAGNOSIS — M5432 Sciatica, left side: Secondary | ICD-10-CM | POA: Diagnosis not present

## 2022-07-18 LAB — CBC WITH DIFFERENTIAL/PLATELET
Abs Immature Granulocytes: 0.02 10*3/uL (ref 0.00–0.07)
Basophils Absolute: 0 10*3/uL (ref 0.0–0.1)
Basophils Relative: 0 %
Eosinophils Absolute: 0.1 10*3/uL (ref 0.0–0.5)
Eosinophils Relative: 0 %
HCT: 39.2 % (ref 36.0–46.0)
Hemoglobin: 12.8 g/dL (ref 12.0–15.0)
Immature Granulocytes: 0 %
Lymphocytes Relative: 9 %
Lymphs Abs: 1.1 10*3/uL (ref 0.7–4.0)
MCH: 30 pg (ref 26.0–34.0)
MCHC: 32.7 g/dL (ref 30.0–36.0)
MCV: 91.8 fL (ref 80.0–100.0)
Monocytes Absolute: 1.2 10*3/uL — ABNORMAL HIGH (ref 0.1–1.0)
Monocytes Relative: 11 %
Neutro Abs: 9.4 10*3/uL — ABNORMAL HIGH (ref 1.7–7.7)
Neutrophils Relative %: 80 %
Platelets: 170 10*3/uL (ref 150–400)
RBC: 4.27 MIL/uL (ref 3.87–5.11)
RDW: 12.5 % (ref 11.5–15.5)
WBC: 11.8 10*3/uL — ABNORMAL HIGH (ref 4.0–10.5)
nRBC: 0 % (ref 0.0–0.2)

## 2022-07-18 LAB — URINALYSIS, ROUTINE W REFLEX MICROSCOPIC
Bilirubin Urine: NEGATIVE
Glucose, UA: NEGATIVE mg/dL
Ketones, ur: NEGATIVE mg/dL
Nitrite: NEGATIVE
Protein, ur: 100 mg/dL — AB
Specific Gravity, Urine: 1.017 (ref 1.005–1.030)
WBC, UA: >50 WBC/hpf (ref 0–5)
pH: 6 (ref 5.0–8.0)

## 2022-07-18 LAB — LACTIC ACID, PLASMA
Lactic Acid, Venous: 1.9 mmol/L (ref 0.5–1.9)
Lactic Acid, Venous: 1.5 mmol/L (ref 0.5–1.9)

## 2022-07-18 LAB — APTT: aPTT: 30 seconds (ref 24–36)

## 2022-07-18 LAB — COMPREHENSIVE METABOLIC PANEL
ALT: 18 U/L (ref 0–44)
AST: 19 U/L (ref 15–41)
Albumin: 3.9 g/dL (ref 3.5–5.0)
Alkaline Phosphatase: 66 U/L (ref 38–126)
Anion gap: 6 (ref 5–15)
BUN: 10 mg/dL (ref 6–20)
CO2: 28 mmol/L (ref 22–32)
Calcium: 9.4 mg/dL (ref 8.9–10.3)
Chloride: 102 mmol/L (ref 98–111)
Creatinine, Ser: 1.14 mg/dL — ABNORMAL HIGH (ref 0.44–1.00)
GFR, Estimated: 57 mL/min — ABNORMAL LOW (ref >60)
Glucose, Bld: 123 mg/dL — ABNORMAL HIGH (ref 70–99)
Potassium: 3.7 mmol/L (ref 3.5–5.1)
Sodium: 136 mmol/L (ref 135–145)
Total Bilirubin: 0.8 mg/dL (ref 0.3–1.2)
Total Protein: 7.1 g/dL (ref 6.5–8.1)

## 2022-07-18 LAB — PROTIME-INR
INR: 1.1 (ref 0.8–1.2)
Prothrombin Time: 14.1 seconds (ref 11.4–15.2)

## 2022-07-18 LAB — LIPASE, BLOOD: Lipase: <10 U/L — ABNORMAL LOW (ref 11–51)

## 2022-07-18 LAB — SARS CORONAVIRUS 2 BY RT PCR: SARS Coronavirus 2 by RT PCR: NEGATIVE

## 2022-07-18 MED ORDER — SODIUM CHLORIDE 0.9 % IV SOLN
1.0000 g | Freq: Once | INTRAVENOUS | Status: AC
  Administered 2022-07-18: 1 g via INTRAVENOUS
  Filled 2022-07-18: qty 10

## 2022-07-18 MED ORDER — OXYCODONE HCL 5 MG PO TABS: 5.0000 mg | ORAL_TABLET | Freq: Three times a day (TID) | ORAL | Status: DC | PRN

## 2022-07-18 MED ORDER — ONDANSETRON HCL 4 MG/2ML IJ SOLN
4.0000 mg | Freq: Four times a day (QID) | INTRAMUSCULAR | Status: DC | PRN
  Administered 2022-07-18: 4 mg via INTRAVENOUS
  Filled 2022-07-18: qty 2

## 2022-07-18 MED ORDER — SODIUM CHLORIDE 0.9 % IV SOLN
1.0000 g | INTRAVENOUS | Status: DC
  Administered 2022-07-19 – 2022-07-20 (×2): 1 g via INTRAVENOUS
  Filled 2022-07-18 (×2): qty 10

## 2022-07-18 MED ORDER — ENOXAPARIN SODIUM 40 MG/0.4ML IJ SOSY
40.0000 mg | PREFILLED_SYRINGE | INTRAMUSCULAR | Status: DC
  Administered 2022-07-18 – 2022-07-20 (×3): 40 mg via SUBCUTANEOUS
  Filled 2022-07-18 (×3): qty 0.4

## 2022-07-18 MED ORDER — GABAPENTIN 100 MG PO CAPS: 100.0000 mg | ORAL_CAPSULE | Freq: Three times a day (TID) | ORAL | Status: DC | PRN

## 2022-07-18 MED ORDER — SODIUM CHLORIDE 0.9 % IV BOLUS
1000.0000 mL | Freq: Once | INTRAVENOUS | Status: AC
  Administered 2022-07-18: 1000 mL via INTRAVENOUS

## 2022-07-18 MED ORDER — ACETAMINOPHEN 325 MG PO TABS
650.0000 mg | ORAL_TABLET | Freq: Four times a day (QID) | ORAL | Status: DC | PRN
  Administered 2022-07-18 – 2022-07-21 (×5): 650 mg via ORAL
  Filled 2022-07-18 (×5): qty 2

## 2022-07-18 MED ORDER — SERTRALINE HCL 50 MG PO TABS
50.0000 mg | ORAL_TABLET | Freq: Every day | ORAL | Status: DC
  Administered 2022-07-18 – 2022-07-21 (×4): 50 mg via ORAL
  Filled 2022-07-18 (×4): qty 1

## 2022-07-18 MED ORDER — OXYCODONE-ACETAMINOPHEN 5-325 MG PO TABS
1.0000 | ORAL_TABLET | Freq: Three times a day (TID) | ORAL | Status: DC | PRN
  Administered 2022-07-18 – 2022-07-19 (×2): 1 via ORAL
  Filled 2022-07-18 (×2): qty 1

## 2022-07-18 MED ORDER — ACETAMINOPHEN 325 MG PO TABS
650.0000 mg | ORAL_TABLET | Freq: Once | ORAL | Status: AC | PRN
  Administered 2022-07-18: 650 mg via ORAL
  Filled 2022-07-18: qty 2

## 2022-07-18 MED ORDER — ACETAMINOPHEN 650 MG RE SUPP: 650.0000 mg | Freq: Four times a day (QID) | RECTAL | Status: DC | PRN

## 2022-07-18 MED ORDER — ONDANSETRON HCL 4 MG PO TABS: 4.0000 mg | ORAL_TABLET | Freq: Four times a day (QID) | ORAL | Status: DC | PRN

## 2022-07-18 MED ORDER — ALBUTEROL SULFATE (2.5 MG/3ML) 0.083% IN NEBU: 2.5000 mg | INHALATION_SOLUTION | RESPIRATORY_TRACT | Status: DC | PRN

## 2022-07-18 MED ORDER — LACTATED RINGERS IV BOLUS: 1000.0000 mL | Freq: Once | INTRAVENOUS | Status: DC

## 2022-07-18 MED ORDER — OXYCODONE-ACETAMINOPHEN 10-325 MG PO TABS: 1.0000 | ORAL_TABLET | Freq: Three times a day (TID) | ORAL | Status: DC | PRN

## 2022-07-18 MED ORDER — TRAZODONE HCL 50 MG PO TABS: 25.0000 mg | ORAL_TABLET | Freq: Every evening | ORAL | Status: DC | PRN

## 2022-07-18 MED ORDER — IOHEXOL 300 MG/ML  SOLN
100.0000 mL | Freq: Once | INTRAMUSCULAR | Status: AC | PRN
  Administered 2022-07-18: 85 mL via INTRAVENOUS

## 2022-07-18 MED ORDER — SODIUM CHLORIDE 0.9 % IV SOLN: INTRAVENOUS | Status: DC

## 2022-07-18 NOTE — ED Provider Notes (Signed)
[place] EMERGENCY DEPARTMENT AT [facility] Provider Note   CSN: [id number] Arrival date & time: 07/18/22  0844     History  Chief Complaint  Patient presents with   Abdominal Pain    Maria Logan is a 57 y.o. female.   Abdominal Pain   57 year old female presents emergency department with complaints of urinary frequency, malodorous urine, right-sided flank pain, cough, nasal congestion.  Patient states that she has been with cough and nasal congestion intermittently for the past week which is worsened over the past 2 to 3 days.  Reports cough as productive.  Reports some feeling of shortness of breath yesterday with exertion but not experienced otherwise.  Does report some central chest pain worsened with coughing that again was worse yesterday and minimally experienced today.  Reports urinary symptoms beginning 3 days ago and worsening since onset.  Reports some development of lower middle abdominal pain with radiation to right flank area.  Reports chills as well as subjective fever at home.  Denies any nausea, vomiting, vaginal symptoms, change in bowel habits.  Abdominal surgeries include abdominal hysterectomy with bilateral salpingectomy in 2016.   Past medical history significant for fibroids, umbilical hernia, pyelonephritis  Home Medications Prior to Admission medications   Medication Sig Start Date End Date Taking? Authorizing Provider  benzonatate (TESSALON) 100 MG capsule Take 1-2 capsules (100-200 mg total) by mouth every 8 (eight) hours as needed for cough. 12/17/19   Georgetta Haber, NP  cetirizine (ZYRTEC) 10 MG tablet Take 1 tablet (10 mg total) by mouth daily. 12/17/19   Georgetta Haber, NP  estradiol (ESTRACE) 1 MG tablet Take 1 tablet (1 mg total) by mouth daily. Patient not taking: Reported on 05/26/2019 12/04/17   Adam Phenix, MD  gabapentin (NEURONTIN) 100 MG capsule Take 1 capsule (100 mg total) by mouth 3 (three) times daily. 06/02/20   Arthor Captain, PA-C  HYDROcodone-acetaminophen (NORCO/VICODIN) 5-325 MG tablet Take 2 tablets by mouth every 4 (four) hours as needed. 05/27/20   Wynetta Fines, MD  meclizine (ANTIVERT) 12.5 MG tablet Take 1 tablet (12.5 mg total) by mouth 3 (three) times daily as needed for dizziness. Patient not taking: Reported on 01/17/2019 05/10/18   Elvina Sidle, MD  naproxen (NAPROSYN) 500 MG tablet Take by mouth. 05/25/22   [provider]  oxyCODONE-acetaminophen (PERCOCET) 10-325 MG tablet Take 1 tablet by mouth every 8 (eight) hours.    [provider]  predniSONE (DELTASONE) 20 MG tablet Two daily with food Patient not taking: Reported on 05/26/2019 08/18/18   Elvina Sidle, MD  sertraline (ZOLOFT) 50 MG tablet Take 50 mg by mouth daily.    [provider]  triamterene-hydrochlorothiazide (MAXZIDE-25) 37.5-25 MG tablet Take 1 tablet by mouth daily. Patient not taking: Reported on 05/26/2019 08/18/18   Elvina Sidle, MD  Vitamin D, Ergocalciferol, (DRISDOL) 1.25 MG (50000 UNIT) CAPS capsule Take 50,000 Units by mouth every Friday.    [provider]      Allergies    Patient has no known allergies.    Review of Systems   Review of Systems  Gastrointestinal:  Positive for abdominal pain.  All other systems reviewed and are negative.   Physical Exam Updated Vital Signs BP 102/65   Pulse 83   Temp 99.3 F (37.4 C) (Oral)   Resp (!) 25   Ht 5\' 6"  (1.676 m)   Wt 88.5 kg   LMP 12/09/2014 (Approximate)   SpO2 97%  BMI 31.47 kg/m  Physical Exam Vitals and nursing note reviewed.  Constitutional:      General: She is not in acute distress.    Appearance: She is well-developed.  HENT:     Head: Normocephalic and atraumatic.  Eyes:     Conjunctiva/sclera: Conjunctivae normal.  Cardiovascular:     Rate and Rhythm: Normal rate and regular rhythm.     Heart sounds: No murmur heard. Pulmonary:     Effort: Pulmonary effort is normal. No respiratory distress.      Breath sounds: Normal breath sounds. No wheezing, rhonchi or rales.  Abdominal:     Palpations: Abdomen is soft.     Tenderness: There is abdominal tenderness in the suprapubic area. There is right CVA tenderness. There is no left CVA tenderness, guarding or rebound. Negative signs include Murphy's sign and McBurney's sign.  Musculoskeletal:        General: No swelling.     Cervical back: Neck supple.     Right lower leg: No edema.     Left lower leg: No edema.  Skin:    General: Skin is warm and dry.     Capillary Refill: Capillary refill takes less than 2 seconds.  Neurological:     Mental Status: She is alert.  Psychiatric:        Mood and Affect: Mood normal.     ED Results / Procedures / Treatments   Labs (all labs ordered are listed, but only abnormal results are displayed) Labs Reviewed  URINALYSIS, ROUTINE W REFLEX MICROSCOPIC - Abnormal; Notable for the following components:      Result Value   APPearance CLOUDY (*)    Hgb urine dipstick LARGE (*)    Protein, ur 100 (*)    Leukocytes,Ua LARGE (*)    Bacteria, UA RARE (*)    Non Squamous Epithelial 0-5 (*)    All other components within normal limits  COMPREHENSIVE METABOLIC PANEL - Abnormal; Notable for the following components:   Glucose, Bld 123 (*)    Creatinine, Ser 1.14 (*)    GFR, Estimated 57 (*)    All other components within normal limits  CBC WITH DIFFERENTIAL/PLATELET - Abnormal; Notable for the following components:   WBC 11.8 (*)    Neutro Abs 9.4 (*)    Monocytes Absolute 1.2 (*)    All other components within normal limits  LIPASE, BLOOD - Abnormal; Notable for the following components:   Lipase <10 (*)    All other components within normal limits  SARS CORONAVIRUS 2 BY RT PCR  URINE CULTURE  CULTURE, BLOOD (ROUTINE X 2)  CULTURE, BLOOD (ROUTINE X 2)  PROTIME-INR  APTT  LACTIC ACID, PLASMA  LACTIC ACID, PLASMA    EKG EKG Interpretation Date/Time:  Monday July 18 2022 09:36:22  EDT Ventricular Rate:  89 PR Interval:  124 QRS Duration:  85 QT Interval:  323 QTC Calculation: 393 R Axis:   42  Text Interpretation: Sinus rhythm Right atrial enlargement Low voltage, precordial leads RSR' in V1 or V2, probably normal variant no stemi similar prior  (9/19) Confirmed by Tanda Rockers ([id number]) on 07/18/2022 10:05:29 AM  Radiology CT ABDOMEN PELVIS W CONTRAST  Result Date: 07/18/2022 CLINICAL DATA:  Right flank pain, possibly pyelonephritis EXAM: CT ABDOMEN AND PELVIS WITH CONTRAST TECHNIQUE: Multidetector CT imaging of the abdomen and pelvis was performed using the standard protocol following bolus administration of intravenous contrast. RADIATION DOSE REDUCTION: This exam was performed according to the departmental dose-optimization  program which includes automated exposure control, adjustment of the mA and/or kV according to patient size and/or use of iterative reconstruction technique. CONTRAST:  85mL OMNIPAQUE IOHEXOL 300 MG/ML  SOLN COMPARISON:  CT abdomen/pelvis 07/14/2022 FINDINGS: Lower chest: The lung bases are clear. The imaged heart is unremarkable. Hepatobiliary: The liver and gallbladder are unremarkable. There is no biliary ductal dilatation. Pancreas: Unremarkable. Spleen: Unremarkable. Adrenals/Urinary Tract: The adrenals are unremarkable. There is an ill-defined region of hypoenhancement in the right kidney measuring 2.4 cm which involves the overlying cortex, new since the recent study from 07/14/2022. The hypoechoic enhancement is more pronounced on the delayed images. There are additional smaller areas of hypoenhancement more posteriorly and inferiorly in the right kidney on the delayed images (7-28, 7-29). There is mild surrounding fat stranding but no organized perinephric fluid collection. Findings consistent with pyelonephritis. Bilateral renal cysts, larger on the left measuring up to 3.9 cm, are unchanged requiring no specific imaging follow-up. There are no stones  in either kidney or along the course of either ureter. There is symmetric excretion of contrast into the collecting systems on the delayed images. Stomach/Bowel: There is a small hiatal hernia. The stomach is otherwise unremarkable. There is no evidence of bowel obstruction. There is no the appendix is normal. There is no abnormal bowel wall thickening or inflammatory change. Vascular/Lymphatic: The abdominal aorta is normal in course and caliber with minimal calcified plaque. The major branch vessels are patent. The main portal and splenic veins are patent. There is no abdominal or pelvic lymphadenopathy. Reproductive: The uterus is surgically absent. There is no adnexal mass. Other: There is no ascites or free air. There is no abscess in the abdomen/pelvis. Musculoskeletal: There is no acute osseous abnormality or suspicious osseous lesion. IMPRESSION: Findings consistent with pyelonephritis on the right. No perinephric fluid collection. Electronically Signed   By: Lesia Hausen M.D.   On: 07/18/2022 11:26   DG Chest 2 View  Result Date: 07/18/2022 CLINICAL DATA:  fever, cough EXAM: CHEST - 2 VIEW COMPARISON:  CXR 06/14/22 FINDINGS: No pleural effusion. No pneumothorax. No focal airspace opacity. Normal cardiac and mediastinal contours. No radiographically apparent displaced rib fractures. Visualized upper abdomen is unremarkable. Vertebral body heights are maintained. IMPRESSION: No focal airspace opacity. Electronically Signed   By: Lorenza Cambridge M.D.   On: 07/18/2022 09:35    Procedures .Critical Care  Performed by: Peter Garter, PA Authorized by: Peter Garter, PA   Critical care provider statement:    Critical care time (minutes):  43   Critical care was necessary to treat or prevent imminent or life-threatening deterioration of the following conditions:  Sepsis   Critical care was time spent personally by me on the following activities:  Development of treatment plan with patient or  surrogate, discussions with consultants, evaluation of patient's response to treatment, examination of patient, ordering and review of laboratory studies, ordering and review of radiographic studies, ordering and performing treatments and interventions, pulse oximetry, re-evaluation of patient's condition and review of old charts   I assumed direction of critical care for this patient from another provider in my specialty: no     Care discussed with: admitting provider       Medications Ordered in ED Medications  0.9 %  sodium chloride infusion (has no administration in time range)  acetaminophen (TYLENOL) tablet 650 mg (650 mg Oral Given 07/18/22 0937)  cefTRIAXone (ROCEPHIN) 1 g in sodium chloride 0.9 % 100 mL IVPB (0 g Intravenous Stopped  07/18/22 1058)  sodium chloride 0.9 % bolus 1,000 mL (0 mLs Intravenous Stopped 07/18/22 1120)  iohexol (OMNIPAQUE) 300 MG/ML solution 100 mL (85 mLs Intravenous Contrast Given 07/18/22 1047)  sodium chloride 0.9 % bolus 1,000 mL (1,000 mLs Intravenous New Bag/Given 07/18/22 1257)    ED Course/ Medical Decision Making/ A&P Clinical Course as of 07/18/22 1340  Mon Jul 18, 2022  1331 Consulted Dr. Alm Bustard regarding the patient who agreed with admission and assume further treatment/care. [CR]    Clinical Course User Index [CR] Peter Garter, PA                             Medical Decision Making Amount and/or Complexity of Data Reviewed Labs: ordered. Radiology: ordered.  Risk OTC drugs. Prescription drug management. Decision regarding hospitalization.   This patient presents to the ED for concern of abdominal pain, urinary symptoms, cough, this involves an extensive number of treatment options, and is a complaint that carries with it a high risk of complications and morbidity.  The differential diagnosis includes viral URI, pneumonia, COVID, influenza, RSV, sepsis, malignancy, cystitis, pyelonephritis, nephrolithiasis, diverticulitis, appendicitis,  volvulus, SBO/LBO, gastritis, pancreatitis, CBD pathology, cholecystitis, hepatitis   Co morbidities that complicate the patient evaluation  See HPI   Additional history obtained:  Additional history obtained from EMR External records from outside source obtained and reviewed including hospital records   Lab Tests:  I Ordered, and personally interpreted labs.  The pertinent results include: Leukocytosis of 11.8.  No evidence of anemia.  Platelets within normal range.  UA significant for urinary tract infection with rare bacteria, greater than 50 WBCs with 21-50 RBCs and large leukocytes indicative of urinary tract infection.  UA also with large hemoglobin, 100 proteins and 0-5 non-squamous epithelial cells.  COVID-negative.  Lipase within normal limits.  Blood cultures pending.  Lactic pending   Imaging Studies ordered:  I ordered imaging studies including CT abdomen pelvis I independently visualized and interpreted imaging which showed pyelonephritis.  No evidence of collection. I agree with the radiologist interpretation   Cardiac Monitoring: / EKG:  The patient was maintained on a cardiac monitor.  I personally viewed and interpreted the cardiac monitored which showed an underlying rhythm of: Sinus rhythm   Consultations Obtained:  See ED course  Problem List / ED Course / Critical interventions / Medication management  Pyelonephritis I ordered medication including 2 L normal saline, Rocephin, Tylenol   Reevaluation of the patient after these medicines showed that the patient improved I have reviewed the patients home medicines and have made adjustments as needed   Social Determinants of Health:  Denies tobacco, illicit drug use   Test / Admission - Considered:  Pyelonephritis Laboratory/imaging studies significant for: See above 57 year old female presents emergency department with complaints of right-sided flank pain for the past 2 to 3 days.  On arrival,  patient mildly tachycardic with heart rate in the mid 90/low 100s with meeting of SIRS criteria with confirmed sepsis after urinalysis resulted.  Sepsis protocol was followed.  Patient given broad-spectrum antibiotics in the form of Rocephin given prior urine culture susceptibility on 06/28/2021.  CT abdomen pelvis was performed given evidence of right-sided flank pain which showed pyelonephritis without evidence of fluid collection.  Pursued admission given evidence of right-sided pyelonephritis with meeting of SIRS criteria.  Proper consultations were made as depicted in ED course.  Treatment plan discussed at length with patient and she acknowledged understanding  was agreeable to said plan.  Patient stable upon admission the hospital.        Final Clinical Impression(s) / ED Diagnoses Final diagnoses:  Pyelonephritis    Rx / DC Orders ED Discharge Orders     None         Peter Garter, [place] 07/18/22 1340    Tanda Rockers A, DO 07/18/22 1556

## 2022-07-18 NOTE — ED Triage Notes (Signed)
Pt arrives pov, slow gait c/o lower ABD pain and urinary freq x 3 days. Also reports  productive cough and nasal congestion

## 2022-07-18 NOTE — H&P (Signed)
History and Physical  Maria Logan:[id number] DOB: 01/09/1966 DOA: 07/18/2022  PCP: Fleet Contras, MD   Chief Complaint: Right-sided flank pain, vomiting  HPI: Maria Logan is a 57 y.o. female with medical history significant for prior pyelonephritis about 1 year ago, depression being admitted to the hospital with right-sided pyelonephritis.  She started having right-sided flank pain, subjective fever, nausea, and urinary urgency frequency, dysuria about 2 days ago.  She had symptoms like this about 1 year ago when she was treated for pyelonephritis.  Denies any chest pain, shortness of breath, she also had a fever of 103 this morning and decided to come to the ER for evaluation.  ED Course: On evaluation in the ER, she was febrile to 103, blood pressure low about 103/59 but hemodynamically stable saturating well.  Lab work showed mild leukocytosis, normal lactate, creatinine slightly elevated from baseline at 1.14.  Urinalysis consistent with UTI, and CT scan done consistent with right-sided pyelonephritis.  She was given some IV fluids, empiric IV Rocephin and hospitalist contacted for admission.  Review of Systems: Please see HPI for pertinent positives and negatives. A complete 10 system review of systems are otherwise negative.  Past Medical History:  Diagnosis Date   Fibroids    Umbilical hernia    CT 02/25/15   Vaginal delivery 1988, 1990, 2005   Past Surgical History:  Procedure Laterality Date   ABDOMINAL HYSTERECTOMY N/A 12/23/2014   Procedure: HYSTERECTOMY ABDOMINAL;  Surgeon: Adam Phenix, MD;  Location: [facility];  Service: Gynecology;  Laterality: N/A;  Requested 12/23/14 @ 1:00p   BILATERAL SALPINGECTOMY Bilateral 12/23/2014   Procedure: BILATERAL SALPINGECTOMY;  Surgeon: Adam Phenix, MD;  Location: [facility];  Service: Gynecology;  Laterality: Bilateral;   EXAMINATION UNDER ANESTHESIA N/A 06/04/2014   Procedure: EXAM UNDER ANESTHESIA;  Surgeon: Lesly Dukes, MD;   Location: [facility];  Service: Gynecology;  Laterality: N/A;   OPERATIVE ULTRASOUND N/A 06/04/2014   Procedure: OPERATIVE ULTRASOUND;  Surgeon: Lesly Dukes, MD;  Location: [facility];  Service: Gynecology;  Laterality: N/A;    Social History:  reports that she has never smoked. She has never used smokeless tobacco. She reports current alcohol use. She reports that she does not use drugs.   No Known Allergies  Family History  Problem Relation Age of Onset   Diabetes Mother    Alcohol abuse Neg Hx    Arthritis Neg Hx    Asthma Neg Hx    Birth defects Neg Hx    Cancer Neg Hx    COPD Neg Hx    Depression Neg Hx    Drug abuse Neg Hx    Early death Neg Hx    Hearing loss Neg Hx    Heart disease Neg Hx    Hyperlipidemia Neg Hx    Hypertension Neg Hx    Kidney disease Neg Hx    Learning disabilities Neg Hx    Mental illness Neg Hx    Mental retardation Neg Hx    Miscarriages / Stillbirths Neg Hx    Stroke Neg Hx    Vision loss Neg Hx    Varicose Veins Neg Hx      Prior to Admission medications   Medication Sig Start Date End Date Taking? Authorizing Provider  benzonatate (TESSALON) 100 MG capsule Take 1-2 capsules (100-200 mg total) by mouth every 8 (eight) hours as needed for cough. Patient not taking: Reported on 07/18/2022 12/17/19   Georgetta Haber, NP  cetirizine (ZYRTEC) 10 MG tablet Take 1 tablet (10 mg total) by mouth daily. 12/17/19   Georgetta Haber, NP  estradiol (ESTRACE) 1 MG tablet Take 1 tablet (1 mg total) by mouth daily. Patient not taking: Reported on 05/26/2019 12/04/17   Adam Phenix, MD  gabapentin (NEURONTIN) 100 MG capsule Take 1 capsule (100 mg total) by mouth 3 (three) times daily. 06/02/20   Arthor Captain, PA-C  HYDROcodone-acetaminophen (NORCO/VICODIN) 5-325 MG tablet Take 2 tablets by mouth every 4 (four) hours as needed. 05/27/20   Wynetta Fines, MD  meclizine (ANTIVERT) 12.5 MG tablet Take 1 tablet (12.5 mg total) by mouth 3 (three) times daily as  needed for dizziness. Patient not taking: Reported on 01/17/2019 05/10/18   Elvina Sidle, MD  naproxen (NAPROSYN) 500 MG tablet Take by mouth. 05/25/22   [provider]  oxyCODONE-acetaminophen (PERCOCET) 10-325 MG tablet Take 1 tablet by mouth every 8 (eight) hours.    [provider]  predniSONE (DELTASONE) 20 MG tablet Two daily with food Patient not taking: Reported on 05/26/2019 08/18/18   Elvina Sidle, MD  sertraline (ZOLOFT) 50 MG tablet Take 50 mg by mouth daily.    [provider]  triamterene-hydrochlorothiazide (MAXZIDE-25) 37.5-25 MG tablet Take 1 tablet by mouth daily. Patient not taking: Reported on 05/26/2019 08/18/18   Elvina Sidle, MD  Vitamin D, Ergocalciferol, (DRISDOL) 1.25 MG (50000 UNIT) CAPS capsule Take 50,000 Units by mouth every Friday.    [provider]    Physical Exam: BP 108/65 (BP Location: Right Arm)   Pulse 80   Temp 98.5 F (36.9 C) (Oral)   Resp 18   Ht 5\' 6"  (1.676 m)   Wt 91.6 kg   LMP 12/09/2014 (Approximate)   SpO2 100%   BMI 32.60 kg/m   General:  Alert, oriented, calm, in no acute distress  Eyes: EOMI, clear conjuctivae, white sclerea Neck: supple, no masses, trachea mildline  Cardiovascular: RRR, no murmurs or rubs, no peripheral edema  Respiratory: clear to auscultation bilaterally, no wheezes, no crackles  Abdomen: soft, nontender, nondistended, normal bowel tones heard  Skin: dry, no rashes  Musculoskeletal: no joint effusions, normal range of motion  Psychiatric: appropriate affect, normal speech  Neurologic: extraocular muscles intact, clear speech, moving all extremities with intact sensorium          Labs on Admission:  Basic Metabolic Panel: Recent Labs  Lab 07/18/22 0938  NA 136  K 3.7  CL 102  CO2 28  GLUCOSE 123*  BUN 10  CREATININE 1.14*  CALCIUM 9.4   Liver Function Tests: Recent Labs  Lab 07/18/22 0938  AST 19  ALT 18  ALKPHOS 66  BILITOT 0.8  PROT 7.1  ALBUMIN  3.9   Recent Labs  Lab 07/18/22 0938  LIPASE <10*   No results for input(s): "AMMONIA" in the last 168 hours. CBC: Recent Labs  Lab 07/18/22 0938  WBC 11.8*  NEUTROABS 9.4*  HGB 12.8  HCT 39.2  MCV 91.8  PLT 170   Cardiac Enzymes: No results for input(s): "CKTOTAL", "CKMB", "CKMBINDEX", "TROPONINI" in the last 168 hours.  BNP (last 3 results) No results for input(s): "BNP" in the last 8760 hours.  ProBNP (last 3 results) No results for input(s): "PROBNP" in the last 8760 hours.  CBG: No results for input(s): "GLUCAP" in the last 168 hours.  Radiological Exams on Admission: CT ABDOMEN PELVIS W CONTRAST  Result Date: 07/18/2022 CLINICAL DATA:  Right flank pain, possibly pyelonephritis  EXAM: CT ABDOMEN AND PELVIS WITH CONTRAST TECHNIQUE: Multidetector CT imaging of the abdomen and pelvis was performed using the standard protocol following bolus administration of intravenous contrast. RADIATION DOSE REDUCTION: This exam was performed according to the departmental dose-optimization program which includes automated exposure control, adjustment of the mA and/or kV according to patient size and/or use of iterative reconstruction technique. CONTRAST:  85mL OMNIPAQUE IOHEXOL 300 MG/ML  SOLN COMPARISON:  CT abdomen/pelvis 07/14/2022 FINDINGS: Lower chest: The lung bases are clear. The imaged heart is unremarkable. Hepatobiliary: The liver and gallbladder are unremarkable. There is no biliary ductal dilatation. Pancreas: Unremarkable. Spleen: Unremarkable. Adrenals/Urinary Tract: The adrenals are unremarkable. There is an ill-defined region of hypoenhancement in the right kidney measuring 2.4 cm which involves the overlying cortex, new since the recent study from 07/14/2022. The hypoechoic enhancement is more pronounced on the delayed images. There are additional smaller areas of hypoenhancement more posteriorly and inferiorly in the right kidney on the delayed images (7-28, 7-29). There is mild  surrounding fat stranding but no organized perinephric fluid collection. Findings consistent with pyelonephritis. Bilateral renal cysts, larger on the left measuring up to 3.9 cm, are unchanged requiring no specific imaging follow-up. There are no stones in either kidney or along the course of either ureter. There is symmetric excretion of contrast into the collecting systems on the delayed images. Stomach/Bowel: There is a small hiatal hernia. The stomach is otherwise unremarkable. There is no evidence of bowel obstruction. There is no the appendix is normal. There is no abnormal bowel wall thickening or inflammatory change. Vascular/Lymphatic: The abdominal aorta is normal in course and caliber with minimal calcified plaque. The major branch vessels are patent. The main portal and splenic veins are patent. There is no abdominal or pelvic lymphadenopathy. Reproductive: The uterus is surgically absent. There is no adnexal mass. Other: There is no ascites or free air. There is no abscess in the abdomen/pelvis. Musculoskeletal: There is no acute osseous abnormality or suspicious osseous lesion. IMPRESSION: Findings consistent with pyelonephritis on the right. No perinephric fluid collection. Electronically Signed   By: Lesia Hausen M.D.   On: 07/18/2022 11:26   DG Chest 2 View  Result Date: 07/18/2022 CLINICAL DATA:  fever, cough EXAM: CHEST - 2 VIEW COMPARISON:  CXR 06/14/22 FINDINGS: No pleural effusion. No pneumothorax. No focal airspace opacity. Normal cardiac and mediastinal contours. No radiographically apparent displaced rib fractures. Visualized upper abdomen is unremarkable. Vertebral body heights are maintained. IMPRESSION: No focal airspace opacity. Electronically Signed   By: Lorenza Cambridge M.D.   On: 07/18/2022 09:35    Assessment/Plan This is a pleasant 57 year old female with a history of depression being admitted to the hospital with right-sided pyelonephritis, she is not  septic.  Pyelonephritis-with fever, tachycardia, right-sided flank pain but normal lactate. -Inpatient admission -Continue empiric IV Rocephin -Follow cultures and tailor antibiotic therapy as appropriate  Leukocytosis-mild, due to pyelonephritis  Depression-continue Zoloft  DVT prophylaxis: Lovenox     Code Status: Full Code  Consults called: None  Admission status: The appropriate patient status for this patient is INPATIENT. Inpatient status is judged to be reasonable and necessary in order to provide the required intensity of service to ensure the patient's safety. The patient's presenting symptoms, physical exam findings, and initial radiographic and laboratory data in the context of their chronic comorbidities is felt to place them at high risk for further clinical deterioration. Furthermore, it is not anticipated that the patient will be medically stable for discharge from the  hospital within 2 midnights of admission.    I certify that at the point of admission it is my clinical judgment that the patient will require inpatient hospital care spanning beyond 2 midnights from the point of admission due to high intensity of service, high risk for further deterioration and high frequency of surveillance required  Time spent: 55 minutes  Arnesia Vincelette Sharlette Dense MD [facility] Pager [phone]  If 7PM-7AM, please contact night-coverage [email or web address] Password [facility]  07/18/2022, 3:35 PM

## 2022-07-18 NOTE — ED Notes (Signed)
Thomas with cl called for transport  

## 2022-07-18 NOTE — ED Notes (Signed)
Attempted to call report to IP RN; no answer, will call back

## 2022-07-19 ENCOUNTER — Inpatient Hospital Stay (HOSPITAL_COMMUNITY): Payer: Medicaid Other

## 2022-07-19 DIAGNOSIS — M47815 Spondylosis without myelopathy or radiculopathy, thoracolumbar region: Secondary | ICD-10-CM | POA: Diagnosis not present

## 2022-07-19 DIAGNOSIS — M47816 Spondylosis without myelopathy or radiculopathy, lumbar region: Secondary | ICD-10-CM | POA: Diagnosis not present

## 2022-07-19 DIAGNOSIS — M48061 Spinal stenosis, lumbar region without neurogenic claudication: Secondary | ICD-10-CM | POA: Diagnosis not present

## 2022-07-19 DIAGNOSIS — M4804 Spinal stenosis, thoracic region: Secondary | ICD-10-CM | POA: Diagnosis not present

## 2022-07-19 DIAGNOSIS — N12 Tubulo-interstitial nephritis, not specified as acute or chronic: Secondary | ICD-10-CM | POA: Diagnosis not present

## 2022-07-19 DIAGNOSIS — M5136 Other intervertebral disc degeneration, lumbar region: Secondary | ICD-10-CM | POA: Diagnosis not present

## 2022-07-19 DIAGNOSIS — I878 Other specified disorders of veins: Secondary | ICD-10-CM | POA: Diagnosis not present

## 2022-07-19 LAB — HIV ANTIBODY (ROUTINE TESTING W REFLEX): HIV Screen 4th Generation wRfx: NONREACTIVE

## 2022-07-19 LAB — BASIC METABOLIC PANEL
Anion gap: 6 (ref 5–15)
BUN: 8 mg/dL (ref 6–20)
CO2: 22 mmol/L (ref 22–32)
Calcium: 7.7 mg/dL — ABNORMAL LOW (ref 8.9–10.3)
Chloride: 111 mmol/L (ref 98–111)
Creatinine, Ser: 0.82 mg/dL (ref 0.44–1.00)
GFR, Estimated: >60 mL/min (ref >60)
Glucose, Bld: 127 mg/dL — ABNORMAL HIGH (ref 70–99)
Potassium: 3.5 mmol/L (ref 3.5–5.1)
Sodium: 139 mmol/L (ref 135–145)

## 2022-07-19 LAB — CBC
HCT: 31.5 % — ABNORMAL LOW (ref 36.0–46.0)
Hemoglobin: 10 g/dL — ABNORMAL LOW (ref 12.0–15.0)
MCH: 30.2 pg (ref 26.0–34.0)
MCHC: 31.7 g/dL (ref 30.0–36.0)
MCV: 95.2 fL (ref 80.0–100.0)
Platelets: 153 10*3/uL (ref 150–400)
RBC: 3.31 MIL/uL — ABNORMAL LOW (ref 3.87–5.11)
RDW: 12.5 % (ref 11.5–15.5)
WBC: 8.5 10*3/uL (ref 4.0–10.5)
nRBC: 0 % (ref 0.0–0.2)

## 2022-07-19 LAB — URINE CULTURE

## 2022-07-19 MED ORDER — SODIUM CHLORIDE 0.9 % IV SOLN: INTRAVENOUS | Status: AC

## 2022-07-19 MED ORDER — GABAPENTIN 100 MG PO CAPS
100.0000 mg | ORAL_CAPSULE | Freq: Three times a day (TID) | ORAL | Status: DC
  Administered 2022-07-19 – 2022-07-21 (×8): 100 mg via ORAL
  Filled 2022-07-19 (×8): qty 1

## 2022-07-19 MED ORDER — HYDROMORPHONE HCL 1 MG/ML IJ SOLN
0.5000 mg | INTRAMUSCULAR | Status: DC | PRN
  Administered 2022-07-19 (×2): 0.5 mg via INTRAVENOUS
  Filled 2022-07-19 (×2): qty 0.5

## 2022-07-19 NOTE — Progress Notes (Signed)
[facility]  PROGRESS NOTE  Maria Logan ZOX:[id number] DOB: [birth year] DOA: 07/18/2022 PCP: Fleet Contras, MD   Brief HPI:    57 y.o. female with medical history significant for prior pyelonephritis about 1 year ago, depression being admitted to the hospital with right-sided pyelonephritis.  Presented with right-sided flank pain, fever, urinary urgency, frequency for 2 days.  She had similar symptoms a year ago when she was treated for pyelonephritis. In the ED she was found to be febrile with temperature 103 F, blood pressure was low 103/59 UA was consistent with UTI, CT scan showed right-sided pyonephritis.  Patient started on IV Rocephin    Assessment/Plan:   Recurrent right-sided pyelonephritis -Seen on CT abdomen/pelvis -Discussed with urology, Dr. Marlou Porch; patient was seen at [facility] urology in October, was diagnosed with interstitial cystitis.  Recommend follow-up with urology as outpatient -Continue IV Rocephin -Follow urine culture results  Low and mid back pain with radiation to left lower extremity -Thoracic and lumbar spine x-ray obtained today shows mild degenerative changes of the thoraco lumbar spine -Since patient has had the symptoms for many years, will obtain MRI of lumbar and thoracic spine, based on the results she may need physical therapy/neurosurgical evaluation as outpatient -Continue gabapentin 100 mg p.o. 3 times daily  Depression -Continue Zoloft   Medications     enoxaparin (LOVENOX) injection  40 mg Subcutaneous Q24H   gabapentin  100 mg Oral TID   sertraline  50 mg Oral Daily     Data Reviewed:   CBG:  No results for input(s): "GLUCAP" in the last 168 hours.  SpO2: 100 %    Vitals:   07/19/22 0235 07/19/22 0618 07/19/22 1226 07/19/22 1344  BP: (!) 102/56 (!) 95/57 118/71 (!) 106/58  Pulse: 70 79 78 79  Resp: 20 14 18    Temp: 98.7 F (37.1 C) 98.2 F (36.8 C) 99.3 F (37.4 C) 99.4 F (37.4 C)  TempSrc: Oral Oral Oral Oral   SpO2: 99% 99% 100% 100%  Weight:      Height:          Data Reviewed:  Basic Metabolic Panel: Recent Labs  Lab 07/18/22 0938 07/19/22 0440  NA 136 139  K 3.7 3.5  CL 102 111  CO2 28 22  GLUCOSE 123* 127*  BUN 10 8  CREATININE 1.14* 0.82  CALCIUM 9.4 7.7*    CBC: Recent Labs  Lab 07/18/22 0938 07/19/22 0440  WBC 11.8* 8.5  NEUTROABS 9.4*  --   HGB 12.8 10.0*  HCT 39.2 31.5*  MCV 91.8 95.2  PLT 170 153    LFT Recent Labs  Lab 07/18/22 0938  AST 19  ALT 18  ALKPHOS 66  BILITOT 0.8  PROT 7.1  ALBUMIN 3.9     Antibiotics: Anti-infectives (From admission, onward)    Start     Dose/Rate Route Frequency Ordered Stop   07/19/22 0900  cefTRIAXone (ROCEPHIN) 1 g in sodium chloride 0.9 % 100 mL IVPB        1 g 200 mL/hr over 30 Minutes Intravenous Every 24 hours 07/18/22 1530 07/23/22 0859   07/18/22 0945  cefTRIAXone (ROCEPHIN) 1 g in sodium chloride 0.9 % 100 mL IVPB        1 g 200 mL/hr over 30 Minutes Intravenous  Once 07/18/22 8119 07/18/22 1058        DVT prophylaxis: Lovenox  Code Status: Full code  Family Communication: No family at bedside   CONSULTS  Subjective   Patient complains of low back pain with tingling and numbness involving left lower extremity   Objective    Physical Examination:   General: Appears in no acute distress Cardiovascular: S1-S2, regular, no murmur auscultated Respiratory: Lungs clear to auscultation bilaterally Abdomen: Abdomen is soft, positive suprapubic tenderness to palpation, right CVA tenderness Musculoskeletal-SLR is negative, tenderness noted at the lumbosacral spine, lower thoracic spine Extremities: No edema in the lower extremities Neurologic: Alert, oriented x 3, intact insight and judgment   Status is: Inpatient:             Meredeth Ide   [facility] If 7PM-7AM, please contact night-coverage at [email or web address], Office  [phone]   07/19/2022, 4:57 PM  LOS: 1 day

## 2022-07-20 DIAGNOSIS — N12 Tubulo-interstitial nephritis, not specified as acute or chronic: Secondary | ICD-10-CM | POA: Diagnosis not present

## 2022-07-20 LAB — URINE CULTURE: Culture: 30000 — AB

## 2022-07-20 MED ORDER — KETOROLAC TROMETHAMINE 15 MG/ML IJ SOLN
15.0000 mg | Freq: Four times a day (QID) | INTRAMUSCULAR | Status: DC
  Administered 2022-07-20 – 2022-07-21 (×4): 15 mg via INTRAVENOUS
  Filled 2022-07-20 (×5): qty 1

## 2022-07-20 MED ORDER — CEFADROXIL 500 MG PO CAPS
1000.0000 mg | ORAL_CAPSULE | Freq: Two times a day (BID) | ORAL | Status: DC
  Administered 2022-07-21: 1000 mg via ORAL
  Filled 2022-07-20 (×2): qty 2

## 2022-07-20 NOTE — Evaluation (Signed)
Physical Therapy Evaluation Patient Details Name: Maria Logan MRN: [id number] DOB: 1965-02-04 Today's Date: 07/20/2022  History of Present Illness  Maria Logan is a 57 yr old female admitted to the hospital with R sided flank pain and found to have pyelonephritis. PMH: depression, fibroids, umbilical hernia, hysterectomy  Clinical Impression  Pt admitted with above diagnosis. At baseline, pt is independent and working.  She had just finished working with OT and returned to bed.  Pt had ambulated in her room with stand by assist using RW - limited by pain.  PT completed evaluation for pt's back pain.  Pt with chronic hx of what seems consistent with sciatic pain that comes and goes.  At this time she does not have radiating pain and L sided back only mildly tender to palpation at SI joint.  Pt's worse pain at this time is R flank/low back which is believed to be from pyleonephritis.  Attempted piriformis stretches but increased pain in R back. Tried prone positioning but again increased R back pain.  Educated pt on positions of comfort for back pain.  In the future, pt could benefit from outpt PT evaluation for her L sided back pain that radiates down leg, but currently ability to assess is limited by her R back/flank pain.  Will maintain on acute PT caseload to advance mobility and assess stair training.  Pt currently with functional limitations due to the deficits listed below (see PT Problem List). Pt will benefit from acute skilled PT to increase their independence and safety with mobility to allow discharge.           Assistance Recommended at Discharge PRN  If plan is discharge home, recommend the following:  Can travel by private vehicle  Help with stairs or ramp for entrance        Equipment Recommendations Rolling walker (2 wheels)  Recommendations for Other Services       Functional Status Assessment Patient has had a recent decline in their functional status and demonstrates the  ability to make significant improvements in function in a reasonable and predictable amount of time.     Precautions / Restrictions Precautions Precautions: None Restrictions Weight Bearing Restrictions: No      Mobility  Bed Mobility Overal bed mobility: Needs Assistance Bed Mobility: Rolling Rolling: Modified independent (Device/Increase time)              Transfers                        Ambulation/Gait               General Gait Details: Pt just finished working with OT and ambulated in room with RW and stand by assist.  Declined repeating walking at this time due to back pain  Stairs            Wheelchair Mobility     Tilt Bed    Modified Rankin (Stroke Patients Only)       Balance                                             Pertinent Vitals/Pain Pain Assessment Pain Assessment: 0-10 Pain Score: 5  Pain Location: R low back/flank Pain Descriptors / Indicators: Discomfort, Tender Pain Intervention(s): Limited activity within patient's tolerance, Monitored during session, Relaxation, Heat applied  Home Living Family/patient expects to be discharged to:: Private residence Living Arrangements: Spouse/significant other Available Help at Discharge: Friend(s);Available PRN/intermittently Type of Home: House Home Access: Level entry     Alternate Level Stairs-Number of Steps: flight Home Layout: Multi-level;Bed/bath upstairs Home Equipment: None      Prior Function Prior Level of Function : Independent/Modified Independent;Working/employed             Mobility Comments: Ambulates without AD ADLs Comments: independent; works as a Buyer, retail   Dominant Hand: Left    Extremity/Trunk Assessment   Upper Extremity Assessment Upper Extremity Assessment: Overall WFL for tasks assessed    Lower Extremity Assessment Lower Extremity Assessment: Overall WFL for tasks assessed     Cervical / Trunk Assessment Cervical / Trunk Assessment: Normal  Communication   Communication: No difficulties  Cognition Arousal/Alertness: Awake/alert Behavior During Therapy: WFL for tasks assessed/performed Overall Cognitive Status: Within Functional Limits for tasks assessed                                 General Comments: Oriented x4, able to follow commands without difficulty        General Comments   Back pain assessment:  Pt reports hx of pain on L side that radiates/pulses from low back , posterior thigh, calf, and to toe.  States present for months and comes and goes.  States tends to be worse when sitting a long time , when she lays down at night (sleeps L side) and longer walks, but not consistent.    Pt reports pain in R back flank for a couple weeks and is constant and not effected by position - believed to be from pyleonephritis.   During assessment pt's worst pain right now is on the right.  She had minimal pain on L and it was not radiating down leg.  Did have her try prone positioning - worsened R pain.  While in prone -palpated back.  Pt mild tenderness L SI joint but most pain again R low back and flank.  Returned to supine.  Educated and attempted figure 4 piriformis stretch but increased R back pain.  Educated on positions of comfort for back pain when sleeping including supine with pillow under knees and if on side pillow between knees.  Discussed difficult to assess sciatic pain right now as it is overshadowed by her R flank pain.  Recommended f/u outpt PT for sciatic pain once pt has recovered from current admission.    Exercises     Assessment/Plan    PT Assessment Patient needs continued PT services  PT Problem List Decreased mobility;Decreased activity tolerance;Decreased balance;Decreased knowledge of use of DME;Pain       PT Treatment Interventions DME instruction;Therapeutic activities;Modalities;Gait training;Therapeutic  exercise;Patient/family education;Stair training;Balance training;Functional mobility training;Neuromuscular re-education;Manual techniques    PT Goals (Current goals can be found in the Care Plan section)  Acute Rehab PT Goals Patient Stated Goal: decrease pain PT Goal Formulation: With patient Time For Goal Achievement: 08/03/22 Potential to Achieve Goals: Good    Frequency Min 1X/week     Co-evaluation               AM-PAC PT "6 Clicks" Mobility  Outcome Measure Help needed turning from your back to your side while in a flat bed without using bedrails?: A Little Help needed moving from lying on your back to sitting on the  side of a flat bed without using bedrails?: A Little Help needed moving to and from a bed to a chair (including a wheelchair)?: A Little Help needed standing up from a chair using your arms (e.g., wheelchair or bedside chair)?: A Little Help needed to walk in hospital room?: A Little Help needed climbing 3-5 steps with a railing? : A Little 6 Click Score: 18    End of Session   Activity Tolerance: Patient limited by pain (and just worked with OT) Patient left: in bed;with call bell/phone within reach Nurse Communication: Mobility status PT Visit Diagnosis: Other abnormalities of gait and mobility (R26.89);Pain Pain - Right/Left: Left Pain - part of body:  (back/sciatica)    Time: 0981-1914 PT Time Calculation (min) (ACUTE ONLY): 22 min   Charges:   PT Evaluation $PT Eval Low Complexity: 1 Low   PT General Charges $$ ACUTE PT VISIT: 1 Visit         Anise Salvo, PT Acute Rehab [facility] [phone]   Rayetta Humphrey 07/20/2022, 5:46 PM

## 2022-07-20 NOTE — Progress Notes (Signed)
PROGRESS NOTE    Maria Logan  ZOX:[id number] DOB: 10-30-1965 DOA: 07/18/2022 PCP: Fleet Contras, MD  Chief Complaint  Patient presents with   Abdominal Pain    Brief Narrative:   57 y.o. female with medical history significant for prior pyelonephritis about 1 year ago, depression being admitted to the hospital with right-sided pyelonephritis.  Presented with right-sided flank pain, fever, urinary urgency, frequency for 2 days.  She had similar symptoms [place] year ago when she was treated for pyelonephritis. In the ED she was found to be febrile with temperature 103 F, blood pressure was low 103/59 UA was consistent with UTI, CT scan showed right-sided pyonephritis.  Patient started on IV Rocephin  Assessment & Plan:   Principal Problem:   Pyelonephritis  E. Coli UTI  Pyelonephritis CT with R pyelo Transition to oral duricef, plan for 7 days therapy (discussed with pharmacy) Toradol for her continued flank pain, will follow symptoms  L Sided Sciatica Low Back Pain Seems like sx consistent with sciatica, no saddle anesthesia MRI T/L spine showing congenitally short pedicales, mild bilateral neural foraminal narrowing at L1-2, L2-3, L3-4, and L4-5.  L foraminal disc protrusion at L3-4 may contact exiting L L3 nerve.  Mild bilateral foraminal narrowing at t9-10, T10-11, and T11-12.  No significant spinal canal stenosis in thoracic or lumbar spine. Follow with nsgy outpatient Toradol for now, consider steroids, would prefer to avoid with infection above  Interstitial Cystitis Follow with urology outpatient  Recurrent UTIs? She notes Thedora Rings history to me of recurrent UTI's though I don't see this objectively.  Currently treating for pyelo.  Would follow outpatient with PCP and urology.  If noted, would consider vaginal estrogen (especially as she also notes symptoms like dyspareunia and is post menopausal).  Depression zoloft     DVT prophylaxis: lovenox Code Status: full Family  Communication: none Disposition:   Status is: Inpatient Remains inpatient appropriate because: need for pain management, therapy   Consultants:  none  Procedures:  none  Antimicrobials:  Anti-infectives (From admission, onward)    Start     Dose/Rate Route Frequency Ordered Stop   07/21/22 1000  cefadroxil (DURICEF) capsule 1,000 mg        1,000 mg Oral 2 times daily 07/20/22 1252 07/25/22 0959   07/19/22 0900  cefTRIAXone (ROCEPHIN) 1 g in sodium chloride 0.9 % 100 mL IVPB  Status:  Discontinued        1 g 200 mL/hr over 30 Minutes Intravenous Every 24 hours 07/18/22 1530 07/20/22 1252   07/18/22 0945  cefTRIAXone (ROCEPHIN) 1 g in sodium chloride 0.9 % 100 mL IVPB        1 g 200 mL/hr over 30 Minutes Intravenous  Once 07/18/22 0937 07/18/22 1058       Subjective: Her and sister have lots of questions regarding what's going on, her diagnosis  Objective: Vitals:   07/19/22 1344 07/19/22 2037 07/20/22 0553 07/20/22 1344  BP: (!) 106/58 115/69 133/67 129/69  Pulse: 79 78 86 85  Resp:  18 19 20   Temp: 99.4 F (37.4 C) 98.9 F (37.2 C) 99.8 F (37.7 C) 98.5 F (36.9 C)  TempSrc: Oral Oral Oral Oral  SpO2: 100% 98% 100% 100%  Weight:      Height:        Intake/Output Summary (Last 24 hours) at 07/20/2022 1821 Last data filed at 07/20/2022 1300 Gross per 24 hour  Intake 480 ml  Output --  Net 480 ml  Filed Weights   07/18/22 0852 07/18/22 1505  Weight: 88.5 kg 91.6 kg    Examination:  General exam: Appears calm and comfortable  Respiratory system: unlabored Cardiovascular system: RRR Gastrointestinal system: Abdomen is nondistended, soft and nontender Central nervous system: CN 2-12 intact, symmetric 5/5 upper extremity strength, lower extremities near equal strength, maybe slight weakness (4+/5 in RLE and 4/5 in LLE) - mild hyperreflexia bilaterally at patellar tendons Extremities: no LEE    Data Reviewed: I have personally reviewed following labs and  imaging studies  CBC: Recent Labs  Lab 07/18/22 0938 07/19/22 0440  WBC 11.8* 8.5  NEUTROABS 9.4*  --   HGB 12.8 10.0*  HCT 39.2 31.5*  MCV 91.8 95.2  PLT 170 153    Basic Metabolic Panel: Recent Labs  Lab 07/18/22 0938 07/19/22 0440  NA 136 139  K 3.7 3.5  CL 102 111  CO2 28 22  GLUCOSE 123* 127*  BUN 10 8  CREATININE 1.14* 0.82  CALCIUM 9.4 7.7*    GFR: Estimated Creatinine Clearance: 89.4 mL/min (by C-G formula based on SCr of 0.82 mg/dL).  Liver Function Tests: Recent Labs  Lab 07/18/22 0938  AST 19  ALT 18  ALKPHOS 66  BILITOT 0.8  PROT 7.1  ALBUMIN 3.9    CBG: No results for input(s): "GLUCAP" in the last 168 hours.   Recent Results (from the past 240 hour(s))  SARS Coronavirus 2 by RT PCR (hospital order, performed in [facility] hospital lab) *cepheid single result test* Anterior Nasal Swab     Status: None   Collection Time: 07/18/22  9:02 AM   Specimen: Anterior Nasal Swab  Result Value Ref Range Status   SARS Coronavirus 2 by RT PCR NEGATIVE NEGATIVE Final    Comment: (NOTE) SARS-CoV-2 target nucleic acids are NOT DETECTED.  The SARS-CoV-2 RNA is generally detectable in upper and lower respiratory specimens during the acute phase of infection. The lowest concentration of SARS-CoV-2 viral copies this assay can detect is 250 copies / mL. Mario Voong negative result does not preclude SARS-CoV-2 infection and should not be used as the sole basis for treatment or other patient management decisions.  Ferrin Liebig negative result may occur with improper specimen collection / handling, submission of specimen other than nasopharyngeal swab, presence of viral mutation(s) within the areas targeted by this assay, and inadequate number of viral copies (<250 copies / mL). Breeanne Oblinger negative result must be combined with clinical observations, patient history, and epidemiological information.  Fact Sheet for Patients:   RoadLapTop.co.za  Fact Sheet for  Healthcare Providers: http://kim-miller.com/  This test is not yet approved or  cleared by the [place] FDA and has been authorized for detection and/or diagnosis of SARS-CoV-2 by FDA under an Emergency Use Authorization (EUA).  This EUA will remain in effect (meaning this test can be used) for the duration of the COVID-19 declaration under Section 564(b)(1) of the Act, 21 U.S.C. section 360bbb-3(b)(1), unless the authorization is terminated or revoked sooner.  Performed at [facility], [address] [place], [place] [address]   Urine Culture     Status: Abnormal   Collection Time: 07/18/22  9:02 AM   Specimen: Urine, Clean Catch  Result Value Ref Range Status   Specimen Description   Final    URINE, CLEAN CATCH Performed at [facility], [address] [place], [place] [address]    Special Requests   Final    NONE Performed at [facility], [phone]  [address] [place], [place], [place] [address]    Culture 30,000 COLONIES/mL ESCHERICHIA COLI (Rawad Bochicchio)  Final   Report Status 07/20/2022 FINAL  Final   Organism ID, Bacteria ESCHERICHIA COLI (Norvin Ohlin)  Final      Susceptibility   Escherichia coli - MIC*    AMPICILLIN <=2 SENSITIVE Sensitive     CEFAZOLIN <=4 SENSITIVE Sensitive     CEFEPIME <=0.12 SENSITIVE Sensitive     CEFTRIAXONE <=0.25 SENSITIVE Sensitive     CIPROFLOXACIN <=0.25 SENSITIVE Sensitive     GENTAMICIN <=1 SENSITIVE Sensitive     IMIPENEM <=0.25 SENSITIVE Sensitive     NITROFURANTOIN <=16 SENSITIVE Sensitive     TRIMETH/SULFA <=20 SENSITIVE Sensitive     AMPICILLIN/SULBACTAM <=2 SENSITIVE Sensitive     PIP/TAZO <=4 SENSITIVE Sensitive     * 30,000 COLONIES/mL ESCHERICHIA COLI  Blood Culture (routine x 2)     Status: None (Preliminary result)   Collection Time: 07/18/22 12:52 PM   Specimen: BLOOD  Result Value Ref Range Status   Specimen Description   Final    BLOOD BLOOD RIGHT ARM Performed at  [facility], [address] [place], [place] [address]    Special Requests   Final    BOTTLES DRAWN AEROBIC AND ANAEROBIC Blood Culture adequate volume Performed at [facility], [address] [place], [place] [address]    Culture   Final    NO GROWTH 2 DAYS Performed at [facility], [address], [place], [place] [address]    Report Status PENDING  Incomplete  Blood Culture (routine x 2)     Status: None (Preliminary result)   Collection Time: 07/18/22 12:52 PM   Specimen: BLOOD RIGHT WRIST  Result Value Ref Range Status   Specimen Description   Final    BLOOD RIGHT WRIST Performed at [facility], [address], [place], [place] [address]    Special Requests   Final    BOTTLES DRAWN AEROBIC AND ANAEROBIC Blood Culture adequate volume Performed at [facility], [address] [place], [place] [address]    Culture   Final    NO GROWTH 2 DAYS Performed at [facility], [address] [place], [place] [address]    Report Status PENDING  Incomplete         Radiology Studies: MR THORACIC SPINE WO CONTRAST  Result Date: 07/20/2022 CLINICAL DATA:  Mid and low back pain EXAM: MRI THORACIC AND LUMBAR SPINE WITHOUT CONTRAST TECHNIQUE: Multiplanar and multiecho pulse sequences of the thoracic and lumbar spine were obtained without intravenous contrast. COMPARISON:  No prior MRI of the thoracic or lumbar spine available FINDINGS: MRI THORACIC SPINE FINDINGS Alignment: Mild S-shaped curvature of the thoracolumbar spine. No significant listhesis. Preservation of the normal thoracic kyphosis. Vertebrae: No acute fracture, evidence of discitis, or suspicious osseous lesion. Cord:  Normal signal and morphology. Paraspinal and other soft tissues: Trace bilateral pleural effusions with associated atelectasis. Disc levels: No significant spinal canal stenosis. Mild bilateral neural foraminal  narrowing at T9-T10, T10-T11, and T11-T12. MRI LUMBAR SPINE FINDINGS Segmentation:  5 lumbar type vertebral bodies. Alignment: Levocurvature of the lumbar spine. Exaggeration of the normal lumbar lordosis. No listhesis. Vertebrae: No acute fracture, evidence of discitis, or suspicious osseous lesion. Congenitally short pedicles, which narrow the AP diameter of the spinal canal. Conus medullaris and cauda equina: Conus extends to the L2 level. Conus and cauda equina appear normal. Paraspinal and other soft tissues: No acute finding compared to  the 07/18/2022 CT abdomen pelvis. Disc levels: T12-L1: No significant disc bulge. No spinal canal stenosis or neural foraminal narrowing. L1-L2: No significant disc bulge. Mild facet arthropathy. No spinal canal stenosis. Mild bilateral neural foraminal narrowing. L2-L3: No significant disc bulge. Mild facet arthropathy. No spinal canal stenosis. Mild bilateral neural foraminal narrowing. L3-L4: Minimal disc bulge with left foraminal protrusion, which may contact the exiting left L3 nerve. Mild facet arthropathy. No spinal canal stenosis. Mild bilateral neural foraminal narrowing. L4-L5: No significant disc bulge. Mild facet arthropathy. No spinal canal stenosis. Mild bilateral neural foraminal narrowing. L5-S1: No significant disc bulge. Mild facet arthropathy. No spinal canal stenosis or neural foraminal narrowing. IMPRESSION: 1. Congenitally short pedicles, which narrow the AP diameter of the spinal canal. 2. Mild bilateral neural foraminal narrowing at L1-L2, L2-L3, L3-L4, and L4-L5. 3. Left foraminal disc protrusion at L3-L4 may contact the exiting left L3 nerve. 4. Mild bilateral neural foraminal narrowing at T9-T10, T10-T11, and T11-T12. 5. No significant spinal canal stenosis in the thoracic or lumbar spine. Electronically Signed   By: Wiliam Ke M.D.   On: 07/20/2022 03:02   MR LUMBAR SPINE WO CONTRAST  Result Date: 07/20/2022 CLINICAL DATA:  Mid and low back  pain EXAM: MRI THORACIC AND LUMBAR SPINE WITHOUT CONTRAST TECHNIQUE: Multiplanar and multiecho pulse sequences of the thoracic and lumbar spine were obtained without intravenous contrast. COMPARISON:  No prior MRI of the thoracic or lumbar spine available FINDINGS: MRI THORACIC SPINE FINDINGS Alignment: Mild S-shaped curvature of the thoracolumbar spine. No significant listhesis. Preservation of the normal thoracic kyphosis. Vertebrae: No acute fracture, evidence of discitis, or suspicious osseous lesion. Cord:  Normal signal and morphology. Paraspinal and other soft tissues: Trace bilateral pleural effusions with associated atelectasis. Disc levels: No significant spinal canal stenosis. Mild bilateral neural foraminal narrowing at T9-T10, T10-T11, and T11-T12. MRI LUMBAR SPINE FINDINGS Segmentation:  5 lumbar type vertebral bodies. Alignment: Levocurvature of the lumbar spine. Exaggeration of the normal lumbar lordosis. No listhesis. Vertebrae: No acute fracture, evidence of discitis, or suspicious osseous lesion. Congenitally short pedicles, which narrow the AP diameter of the spinal canal. Conus medullaris and cauda equina: Conus extends to the L2 level. Conus and cauda equina appear normal. Paraspinal and other soft tissues: No acute finding compared to the 07/18/2022 CT abdomen pelvis. Disc levels: T12-L1: No significant disc bulge. No spinal canal stenosis or neural foraminal narrowing. L1-L2: No significant disc bulge. Mild facet arthropathy. No spinal canal stenosis. Mild bilateral neural foraminal narrowing. L2-L3: No significant disc bulge. Mild facet arthropathy. No spinal canal stenosis. Mild bilateral neural foraminal narrowing. L3-L4: Minimal disc bulge with left foraminal protrusion, which may contact the exiting left L3 nerve. Mild facet arthropathy. No spinal canal stenosis. Mild bilateral neural foraminal narrowing. L4-L5: No significant disc bulge. Mild facet arthropathy. No spinal canal stenosis.  Mild bilateral neural foraminal narrowing. L5-S1: No significant disc bulge. Mild facet arthropathy. No spinal canal stenosis or neural foraminal narrowing. IMPRESSION: 1. Congenitally short pedicles, which narrow the AP diameter of the spinal canal. 2. Mild bilateral neural foraminal narrowing at L1-L2, L2-L3, L3-L4, and L4-L5. 3. Left foraminal disc protrusion at L3-L4 may contact the exiting left L3 nerve. 4. Mild bilateral neural foraminal narrowing at T9-T10, T10-T11, and T11-T12. 5. No significant spinal canal stenosis in the thoracic or lumbar spine. Electronically Signed   By: Wiliam Ke M.D.   On: 07/20/2022 03:02   DG Thoracic Spine W/Swimmers  Result Date: 07/19/2022 CLINICAL DATA:  [address] Back  pain [id number]. History of RIGHT-sided pyelonephritis. EXAM: LUMBAR SPINE - 2-3 VIEW; THORACIC SPINE - 3 VIEWS COMPARISON:  July 18, 2022. FINDINGS: There are five non-rib bearing lumbar-type vertebral bodies and 12 rib-bearing thoracic type vertebral bodies. There is normal alignment. There is no evidence for acute fracture or subluxation. Intervertebral disc spaces are relatively preserved with mild endplate proliferative changes at L3-4, L1-2 and throughout the thoracic spine. Pelvic phleboliths. Unchanged cardiomediastinal silhouette. IMPRESSION: Mild degenerative changes of the thoracolumbar spine. Electronically Signed   By: Meda Klinefelter M.D.   On: 07/19/2022 15:07   DG Lumbar Spine 2-3 Views  Result Date: 07/19/2022 CLINICAL DATA:  [address] Back pain [id number]. History of RIGHT-sided pyelonephritis. EXAM: LUMBAR SPINE - 2-3 VIEW; THORACIC SPINE - 3 VIEWS COMPARISON:  July 18, 2022. FINDINGS: There are five non-rib bearing lumbar-type vertebral bodies and 12 rib-bearing thoracic type vertebral bodies. There is normal alignment. There is no evidence for acute fracture or subluxation. Intervertebral disc spaces are relatively preserved with mild endplate proliferative changes at L3-4, L1-2 and throughout the  thoracic spine. Pelvic phleboliths. Unchanged cardiomediastinal silhouette. IMPRESSION: Mild degenerative changes of the thoracolumbar spine. Electronically Signed   By: Meda Klinefelter M.D.   On: 07/19/2022 15:07        Scheduled Meds:  [START ON 07/21/2022] cefadroxil  1,000 mg Oral BID   enoxaparin (LOVENOX) injection  40 mg Subcutaneous Q24H   gabapentin  100 mg Oral TID   ketorolac  15 mg Intravenous Q6H   sertraline  50 mg Oral Daily   Continuous Infusions:   LOS: 2 days    Time spent: over 30 min    Lacretia Nicks, MD [facility]   To contact the attending provider between 7A-7P or the covering provider during after hours 7P-7A, please log into the web site [email or web address] and access using universal [place] password for that web site. If you do not have the password, please call the hospital operator.  07/20/2022, 6:21 PM

## 2022-07-20 NOTE — Evaluation (Addendum)
Occupational Therapy Evaluation Patient Details Name: Maria Logan MRN: [id number] DOB: 05/04/1965 Today's Date: 07/20/2022   History of Present Illness Ms. Petronio is a 57 yr old female admitted to the hospital with R sided flank pain and found to have pyelonephritis. PMH: depression, fibroids, umbilical hernia, hysterectomy   Clinical Impression   The pt is currently presenting slightly below her baseline level of functioning for self-care management. She is mostly limited by acute back pain, LLE radiating pain which attributes to sciatica, and slight unsteadiness in dynamic standing. During the evaluation today, she had limited ability to perform lower body dressing tasks, given the aforementioned deficits (further instruction recommended in this regard). OT anticipates she will only require 1-2 additional follow up visits during her hospital stay. OT recommends she return home at discharge.      Recommendations for follow up therapy are one component of a multi-disciplinary discharge planning process, led by the attending physician.  Recommendations may be updated based on patient status, additional functional criteria and insurance authorization.   Assistance Recommended at Discharge PRN     Functional Status Assessment  Patient has had a recent decline in their functional status and demonstrates the ability to make significant improvements in function in a reasonable and predictable amount of time.  Equipment Recommendations  None recommended by OT    Recommendations for Other Services       Precautions / Restrictions Restrictions Weight Bearing Restrictions: No      Mobility Bed Mobility Overal bed mobility: Needs Assistance Bed Mobility: Supine to Sit     Supine to sit: Min guard          Transfers Overall transfer level: Needs assistance Equipment used: Rolling walker (2 wheels) Transfers: Sit to/from Stand Sit to Stand: Supervision           Balance        Sitting balance - Comments: static sitting-good. dynamic sitting-fair+       Standing balance comment: SBA to occasional min guard with RW           ADL either performed or assessed with clinical judgement   ADL Overall ADL's : Needs assistance/impaired;Independent Eating/Feeding: Sitting Eating/Feeding Details (indicate cue type and reason): based on clinical judgement Grooming: Set up;Sitting           Upper Body Dressing : Set up;Sitting   Lower Body Dressing: Moderate assistance Lower Body Dressing Details (indicate cue type and reason): She was limited by back pain and LLE pain, limiting her ability to bend over or demonstrate the figure 4 position, in order to perform sock management. Toilet Transfer: Supervision/safety;Rolling walker (2 wheels) Toilet Transfer Details (indicate cue type and reason): based on clinical judgement                 Vision   Additional Comments: She correctly read the time depicted on the wall clock.            Pertinent Vitals/Pain Pain Assessment Pain Assessment: 0-10 Pain Score: 3  Pain Location: back and LLE: LLE attributed to sciatic Pain Intervention(s): Limited activity within patient's tolerance, Monitored during session, Other (comment) (She reported receiving tramadol)     Hand Dominance Left   Extremity/Trunk Assessment Upper Extremity Assessment Upper Extremity Assessment: Overall WFL for tasks assessed   Lower Extremity Assessment Lower Extremity Assessment: Overall WFL for tasks assessed       Communication Communication Communication: No difficulties   Cognition Arousal/Alertness: Awake/alert Behavior During Therapy: WFL for  tasks assessed/performed Overall Cognitive Status: Within Functional Limits for tasks assessed          General Comments: Oriented x4, able to follow commands without difficulty                Home Living Family/patient expects to be discharged to:: Private  residence Living Arrangements: Alone Available Help at Discharge: Friend(s);Available PRN/intermittently Type of Home: Apartment Home Access: Level entry     Home Layout: 2 levels;Bed/bath upstairs Alternate Level Stairs-Number of Steps: flight up to bedroom and bathroom Alternate Level Stairs-Rails: Left           Home Equipment: None          Prior Functioning/Environment Prior Level of Function : Independent/Modified Independent;Working/employed; Tax adviser Comments: Ambulates without AD ADLs Comments: independent; works as a Production designer, theatre/television/film Problem List: Impaired balance (sitting and/or standing);Decreased activity tolerance;Decreased knowledge of use of DME or AE;Pain      OT Treatment/Interventions: Self-care/ADL training;Therapeutic exercise;DME and/or AE instruction;Therapeutic activities;Balance training;Patient/family education    OT Goals(Current goals can be found in the care plan section) Acute Rehab OT Goals Patient Stated Goal: resolution of pain OT Goal Formulation: With patient Time For Goal Achievement: 08/03/22 Potential to Achieve Goals: Good ADL Goals Pt Will Perform Lower Body Dressing: with modified independence;sit to/from stand Pt Will Transfer to Toilet: with modified independence;ambulating;regular height toilet Pt Will Perform Toileting - Clothing Manipulation and hygiene: with modified independence;sit to/from stand  OT Frequency: Min 1X/week       AM-PAC OT "6 Clicks" Daily Activity     Outcome Measure Help from another person eating meals?: None Help from another person taking care of personal grooming?: A Little Help from another person toileting, which includes using toliet, bedpan, or urinal?: A Little Help from another person bathing (including washing, rinsing, drying)?: A Little Help from another person to put on and taking off regular upper body clothing?: None Help from another person to put on and  taking off regular lower body clothing?: A Little 6 Click Score: 20   End of Session Equipment Utilized During Treatment: Rolling walker (2 wheels) Nurse Communication: Mobility status  Activity Tolerance: Patient limited by pain Patient left: in bed;with call bell/phone within reach  OT Visit Diagnosis: Unsteadiness on feet (R26.81);Pain Pain - part of body:  (back and LLE)                Time: 9629-5284 OT Time Calculation (min): 37 min Charges:  OT General Charges $OT Visit: 1 Visit OT Evaluation $OT Eval Moderate Complexity: 1 Mod    Shaquoia Miers L Fern Canova, OTR/L 07/20/2022, 5:33 PM

## 2022-07-21 DIAGNOSIS — Z90710 Acquired absence of both cervix and uterus: Secondary | ICD-10-CM | POA: Diagnosis not present

## 2022-07-21 DIAGNOSIS — I10 Essential (primary) hypertension: Secondary | ICD-10-CM | POA: Diagnosis not present

## 2022-07-21 DIAGNOSIS — N12 Tubulo-interstitial nephritis, not specified as acute or chronic: Secondary | ICD-10-CM | POA: Diagnosis not present

## 2022-07-21 LAB — GLUCOSE, CAPILLARY: Glucose-Capillary: 100 mg/dL — ABNORMAL HIGH (ref 70–99)

## 2022-07-21 MED ORDER — CEFADROXIL 500 MG PO CAPS: 1000.0000 mg | ORAL_CAPSULE | Freq: Two times a day (BID) | ORAL | 0 refills | Status: AC

## 2022-07-21 NOTE — Discharge Summary (Signed)
Physician Discharge Summary  Maria Logan VOZ:[id number] DOB: 13-Jun-1965 DOA: 07/18/2022  PCP: Fleet Contras, MD  Admit date: 07/18/2022 Discharge date: 07/21/2022  Time spent: 40 minutes  Recommendations for Outpatient Follow-up:  Follow outpatient CBC/CMP  Follow with urology outpatient for interstitial cystitis and ? Recurrent UTI's? (Vaginal estrogen for recurrent UTI's?) Follow with nsgy outpatient for sciatica  PT referral for L sciatica   Discharge Diagnoses:  Principal Problem:   Pyelonephritis   Discharge Condition: stable  Diet recommendation: heart healthy  Filed Weights   07/18/22 0852 07/18/22 1505  Weight: 88.5 kg 91.6 kg    History of present illness:   57 y.o. female with medical history significant for prior pyelonephritis about 1 year ago, depression being admitted to the hospital with right-sided pyelonephritis.  Presented with right-sided flank pain, fever, urinary urgency, frequency for 2 days.  She had similar symptoms Maria Logan year ago when she was treated for pyelonephritis. In the ED she was found to be febrile with temperature 103 F, blood pressure was low 103/59 UA was consistent with UTI, CT scan showed right-sided pyonephritis.  Patient started on IV Rocephin.  She's improved with antibiotics.  Planning to discharge on duricef.  Hospitalization c/b sciatica, imaging with L foraminal disc protrusion at L3-4 may contact exiting L L3 nerve.  Planning for outpatient nsgy follow up in addition to urology and PCP.  See below for additional details  Hospital Course:  Assessment and Plan:  E. Coli UTI  Pyelonephritis CT with R pyelo Transition to oral duricef, plan for 7 days therapy (discussed with pharmacy)   L Sided Sciatica Low Back Pain Seems like sx consistent with sciatica, no saddle anesthesia MRI T/L spine showing congenitally short pedicales, mild bilateral neural foraminal narrowing at L1-2, L2-3, L3-4, and L4-5.  L foraminal disc protrusion at  L3-4 may contact exiting L L3 nerve.  Mild bilateral foraminal narrowing at t9-10, T10-11, and T11-12.  No significant spinal canal stenosis in thoracic or lumbar spine. Follow with nsgy outpatient PT referral    Interstitial Cystitis Follow with urology outpatient   Recurrent UTIs? She notes Maria Logan history to me of recurrent UTI's though I don't see this objectively.  Currently treating for pyelo.  Would follow outpatient with PCP and urology.  If noted, would consider vaginal estrogen (especially as she also notes symptoms like dyspareunia and is post menopausal).   Depression zoloft     Procedures: none   Consultations: none  Discharge Exam: Vitals:   07/21/22 0451 07/21/22 1358  BP: 130/77 132/87  Pulse: 87 83  Resp: 19 18  Temp: 100.2 F (37.9 C) 98.1 F (36.7 C)  SpO2: 100% 99%   Feeling better overall, lots of questions we discussed at length  General: No acute distress. Cardiovascular: RRR Lungs: unlabored Abdomen: Soft, nontender, nondistended  Neurological: Alert and oriented 3. Moves all extremities 4 - LLE slightly weaker than RLE. Cranial nerves II through XII grossly intact. Extremities: No clubbing or cyanosis. No edema.   Discharge Instructions   Discharge Instructions     Call MD for:  difficulty breathing, headache or visual disturbances   Complete by: As directed    Call MD for:  extreme fatigue   Complete by: As directed    Call MD for:  hives   Complete by: As directed    Call MD for:  persistant dizziness or light-headedness   Complete by: As directed    Call MD for:  persistant nausea and vomiting  Complete by: As directed    Call MD for:  redness, tenderness, or signs of infection (pain, swelling, redness, odor or green/yellow discharge around incision site)   Complete by: As directed    Call MD for:  severe uncontrolled pain   Complete by: As directed    Call MD for:  temperature >100.4   Complete by: As directed    Diet - low  sodium heart healthy   Complete by: As directed    Discharge instructions   Complete by: As directed    You were seen for pyelonephritis (Maria Logan kidney infection which is Maria Logan severe type of urinary tract infection or UTI).  You've improved with antibiotics.  We'll discharge you with Maria Logan prescription for duricef and Maria Logan plan to continue antibiotics for another 4 days.  Your right low back pain should continue to improve with antibiotics.  You can use 400 mg ibuprofen every 4-6 hrs as needed for pain.  I would expect this pain to continue to gradually improve and resolve with resolution of your kidney infection.  The pain in your left lower back that radiates down your leg is consistent with sciatica.  You had an MRI that showed Maria Logan left foraminal disc protrusion at L3-L4 that may contact the exiting left L3 nerve root.  You'll need to follow up with your PCP or neurosurgery outpatient.  The gabapentin can help with the nerve pain you have.  You can also use ibuprofen as needed if this helps.  You will need physical therapy.  You should call neurosurgery for Maria Logan follow up appointment.  You should follow up with urology as scheduled.  You have Dartha Logan history of interstitial cystitis which you should continue to follow up with urology.  You should ask urology or your PCP about the management of your recurrent UTI's.  I didn't see evidence of recurrent UTI's in our records, but if this is the case, I think it'd be worth it to discuss vaginal estrogen since you're post menopausal.    Return for new, recurrent, or worsening symptoms.  Please ask your PCP to request records from this hospitalization so they know what was done and what the next steps will be.   Increase activity slowly   Complete by: As directed       Allergies as of 07/21/2022   No Known Allergies      Medication List     STOP taking these medications    estradiol 1 MG tablet Commonly known as: Estrace   meclizine 12.5 MG tablet Commonly known  as: ANTIVERT   naproxen 500 MG tablet Commonly known as: NAPROSYN   oxyCODONE-acetaminophen 10-325 MG tablet Commonly known as: PERCOCET   predniSONE 20 MG tablet Commonly known as: DELTASONE   triamterene-hydrochlorothiazide 37.5-25 MG tablet Commonly known as: MAXZIDE-25       TAKE these medications    APPLE CIDER VINEGAR PO Take 1 capsule by mouth daily as needed (for gut health).   benzonatate 100 MG capsule Commonly known as: TESSALON Take 1-2 capsules (100-200 mg total) by mouth every 8 (eight) hours as needed for cough.   cefadroxil 500 MG capsule Commonly known as: DURICEF Take 2 capsules (1,000 mg total) by mouth 2 (two) times daily for 4 days.   cetirizine 10 MG tablet Commonly known as: ZYRTEC Take 1 tablet (10 mg total) by mouth daily. What changed:  when to take this reasons to take this   gabapentin 100 MG capsule Commonly known as: NEURONTIN Take  1 capsule (100 mg total) by mouth 3 (three) times daily. What changed:  when to take this reasons to take this   HYDROcodone-acetaminophen 5-325 MG tablet Commonly known as: NORCO/VICODIN Take 2 tablets by mouth every 4 (four) hours as needed.   sertraline 50 MG tablet Commonly known as: ZOLOFT Take 50 mg by mouth daily.   Vitamin B Complex Tabs Take 1 tablet by mouth daily.       No Known Allergies    The results of significant diagnostics from this hospitalization (including imaging, microbiology, ancillary and laboratory) are listed below for reference.    Significant Diagnostic Studies: MR THORACIC SPINE WO CONTRAST  Result Date: 07/20/2022 CLINICAL DATA:  Mid and low back pain EXAM: MRI THORACIC AND LUMBAR SPINE WITHOUT CONTRAST TECHNIQUE: Multiplanar and multiecho pulse sequences of the thoracic and lumbar spine were obtained without intravenous contrast. COMPARISON:  No prior MRI of the thoracic or lumbar spine available FINDINGS: MRI THORACIC SPINE FINDINGS Alignment: Mild S-shaped  curvature of the thoracolumbar spine. No significant listhesis. Preservation of the normal thoracic kyphosis. Vertebrae: No acute fracture, evidence of discitis, or suspicious osseous lesion. Cord:  Normal signal and morphology. Paraspinal and other soft tissues: Trace bilateral pleural effusions with associated atelectasis. Disc levels: No significant spinal canal stenosis. Mild bilateral neural foraminal narrowing at T9-T10, T10-T11, and T11-T12. MRI LUMBAR SPINE FINDINGS Segmentation:  5 lumbar type vertebral bodies. Alignment: Levocurvature of the lumbar spine. Exaggeration of the normal lumbar lordosis. No listhesis. Vertebrae: No acute fracture, evidence of discitis, or suspicious osseous lesion. Congenitally short pedicles, which narrow the AP diameter of the spinal canal. Conus medullaris and cauda equina: Conus extends to the L2 level. Conus and cauda equina appear normal. Paraspinal and other soft tissues: No acute finding compared to the 07/18/2022 CT abdomen pelvis. Disc levels: T12-L1: No significant disc bulge. No spinal canal stenosis or neural foraminal narrowing. L1-L2: No significant disc bulge. Mild facet arthropathy. No spinal canal stenosis. Mild bilateral neural foraminal narrowing. L2-L3: No significant disc bulge. Mild facet arthropathy. No spinal canal stenosis. Mild bilateral neural foraminal narrowing. L3-L4: Minimal disc bulge with left foraminal protrusion, which may contact the exiting left L3 nerve. Mild facet arthropathy. No spinal canal stenosis. Mild bilateral neural foraminal narrowing. L4-L5: No significant disc bulge. Mild facet arthropathy. No spinal canal stenosis. Mild bilateral neural foraminal narrowing. L5-S1: No significant disc bulge. Mild facet arthropathy. No spinal canal stenosis or neural foraminal narrowing. IMPRESSION: 1. Congenitally short pedicles, which narrow the AP diameter of the spinal canal. 2. Mild bilateral neural foraminal narrowing at L1-L2, L2-L3,  L3-L4, and L4-L5. 3. Left foraminal disc protrusion at L3-L4 may contact the exiting left L3 nerve. 4. Mild bilateral neural foraminal narrowing at T9-T10, T10-T11, and T11-T12. 5. No significant spinal canal stenosis in the thoracic or lumbar spine. Electronically Signed   By: Wiliam Ke M.D.   On: 07/20/2022 03:02   MR LUMBAR SPINE WO CONTRAST  Result Date: 07/20/2022 CLINICAL DATA:  Mid and low back pain EXAM: MRI THORACIC AND LUMBAR SPINE WITHOUT CONTRAST TECHNIQUE: Multiplanar and multiecho pulse sequences of the thoracic and lumbar spine were obtained without intravenous contrast. COMPARISON:  No prior MRI of the thoracic or lumbar spine available FINDINGS: MRI THORACIC SPINE FINDINGS Alignment: Mild S-shaped curvature of the thoracolumbar spine. No significant listhesis. Preservation of the normal thoracic kyphosis. Vertebrae: No acute fracture, evidence of discitis, or suspicious osseous lesion. Cord:  Normal signal and morphology. Paraspinal and other soft tissues: Trace  bilateral pleural effusions with associated atelectasis. Disc levels: No significant spinal canal stenosis. Mild bilateral neural foraminal narrowing at T9-T10, T10-T11, and T11-T12. MRI LUMBAR SPINE FINDINGS Segmentation:  5 lumbar type vertebral bodies. Alignment: Levocurvature of the lumbar spine. Exaggeration of the normal lumbar lordosis. No listhesis. Vertebrae: No acute fracture, evidence of discitis, or suspicious osseous lesion. Congenitally short pedicles, which narrow the AP diameter of the spinal canal. Conus medullaris and cauda equina: Conus extends to the L2 level. Conus and cauda equina appear normal. Paraspinal and other soft tissues: No acute finding compared to the 07/18/2022 CT abdomen pelvis. Disc levels: T12-L1: No significant disc bulge. No spinal canal stenosis or neural foraminal narrowing. L1-L2: No significant disc bulge. Mild facet arthropathy. No spinal canal stenosis. Mild bilateral neural foraminal  narrowing. L2-L3: No significant disc bulge. Mild facet arthropathy. No spinal canal stenosis. Mild bilateral neural foraminal narrowing. L3-L4: Minimal disc bulge with left foraminal protrusion, which may contact the exiting left L3 nerve. Mild facet arthropathy. No spinal canal stenosis. Mild bilateral neural foraminal narrowing. L4-L5: No significant disc bulge. Mild facet arthropathy. No spinal canal stenosis. Mild bilateral neural foraminal narrowing. L5-S1: No significant disc bulge. Mild facet arthropathy. No spinal canal stenosis or neural foraminal narrowing. IMPRESSION: 1. Congenitally short pedicles, which narrow the AP diameter of the spinal canal. 2. Mild bilateral neural foraminal narrowing at L1-L2, L2-L3, L3-L4, and L4-L5. 3. Left foraminal disc protrusion at L3-L4 may contact the exiting left L3 nerve. 4. Mild bilateral neural foraminal narrowing at T9-T10, T10-T11, and T11-T12. 5. No significant spinal canal stenosis in the thoracic or lumbar spine. Electronically Signed   By: Wiliam Ke M.D.   On: 07/20/2022 03:02   DG Thoracic Spine W/Swimmers  Result Date: 07/19/2022 CLINICAL DATA:  91320 Back pain [id number]. History of RIGHT-sided pyelonephritis. EXAM: LUMBAR SPINE - 2-3 VIEW; THORACIC SPINE - 3 VIEWS COMPARISON:  July 18, 2022. FINDINGS: There are five non-rib bearing lumbar-type vertebral bodies and 12 rib-bearing thoracic type vertebral bodies. There is normal alignment. There is no evidence for acute fracture or subluxation. Intervertebral disc spaces are relatively preserved with mild endplate proliferative changes at L3-4, L1-2 and throughout the thoracic spine. Pelvic phleboliths. Unchanged cardiomediastinal silhouette. IMPRESSION: Mild degenerative changes of the thoracolumbar spine. Electronically Signed   By: Meda Klinefelter M.D.   On: 07/19/2022 15:07   DG Lumbar Spine 2-3 Views  Result Date: 07/19/2022 CLINICAL DATA:  91320 Back pain [id number]. History of RIGHT-sided  pyelonephritis. EXAM: LUMBAR SPINE - 2-3 VIEW; THORACIC SPINE - 3 VIEWS COMPARISON:  July 18, 2022. FINDINGS: There are five non-rib bearing lumbar-type vertebral bodies and 12 rib-bearing thoracic type vertebral bodies. There is normal alignment. There is no evidence for acute fracture or subluxation. Intervertebral disc spaces are relatively preserved with mild endplate proliferative changes at L3-4, L1-2 and throughout the thoracic spine. Pelvic phleboliths. Unchanged cardiomediastinal silhouette. IMPRESSION: Mild degenerative changes of the thoracolumbar spine. Electronically Signed   By: Meda Klinefelter M.D.   On: 07/19/2022 15:07   CT ABDOMEN PELVIS W CONTRAST  Result Date: 07/18/2022 CLINICAL DATA:  Right flank pain, possibly pyelonephritis EXAM: CT ABDOMEN AND PELVIS WITH CONTRAST TECHNIQUE: Multidetector CT imaging of the abdomen and pelvis was performed using the standard protocol following bolus administration of intravenous contrast. RADIATION DOSE REDUCTION: This exam was performed according to the departmental dose-optimization program which includes automated exposure control, adjustment of the mA and/or kV according to patient size and/or use of iterative reconstruction technique. CONTRAST:  85mL OMNIPAQUE IOHEXOL 300 MG/ML  SOLN COMPARISON:  CT abdomen/pelvis 07/14/2022 FINDINGS: Lower chest: The lung bases are clear. The imaged heart is unremarkable. Hepatobiliary: The liver and gallbladder are unremarkable. There is no biliary ductal dilatation. Pancreas: Unremarkable. Spleen: Unremarkable. Adrenals/Urinary Tract: The adrenals are unremarkable. There is an ill-defined region of hypoenhancement in the right kidney measuring 2.4 cm which involves the overlying cortex, new since the recent study from 07/14/2022. The hypoechoic enhancement is more pronounced on the delayed images. There are additional smaller areas of hypoenhancement more posteriorly and inferiorly in the right kidney on the  delayed images (7-28, 7-29). There is mild surrounding fat stranding but no organized perinephric fluid collection. Findings consistent with pyelonephritis. Bilateral renal cysts, larger on the left measuring up to 3.9 cm, are unchanged requiring no specific imaging follow-up. There are no stones in either kidney or along the course of either ureter. There is symmetric excretion of contrast into the collecting systems on the delayed images. Stomach/Bowel: There is Alezander Dimaano small hiatal hernia. The stomach is otherwise unremarkable. There is no evidence of bowel obstruction. There is no the appendix is normal. There is no abnormal bowel wall thickening or inflammatory change. Vascular/Lymphatic: The abdominal aorta is normal in course and caliber with minimal calcified plaque. The major branch vessels are patent. The main portal and splenic veins are patent. There is no abdominal or pelvic lymphadenopathy. Reproductive: The uterus is surgically absent. There is no adnexal mass. Other: There is no ascites or free air. There is no abscess in the abdomen/pelvis. Musculoskeletal: There is no acute osseous abnormality or suspicious osseous lesion. IMPRESSION: Findings consistent with pyelonephritis on the right. No perinephric fluid collection. Electronically Signed   By: Lesia Hausen M.D.   On: 07/18/2022 11:26   DG Chest 2 View  Result Date: 07/18/2022 CLINICAL DATA:  fever, cough EXAM: CHEST - 2 VIEW COMPARISON:  CXR 06/14/22 FINDINGS: No pleural effusion. No pneumothorax. No focal airspace opacity. Normal cardiac and mediastinal contours. No radiographically apparent displaced rib fractures. Visualized upper abdomen is unremarkable. Vertebral body heights are maintained. IMPRESSION: No focal airspace opacity. Electronically Signed   By: Lorenza Cambridge M.D.   On: 07/18/2022 09:35    Microbiology: Recent Results (from the past 240 hour(s))  SARS Coronavirus 2 by RT PCR (hospital order, performed in [facility] hospital  lab) *cepheid single result test* Anterior Nasal Swab     Status: None   Collection Time: 07/18/22  9:02 AM   Specimen: Anterior Nasal Swab  Result Value Ref Range Status   SARS Coronavirus 2 by RT PCR NEGATIVE NEGATIVE Final    Comment: (NOTE) SARS-CoV-2 target nucleic acids are NOT DETECTED.  The SARS-CoV-2 RNA is generally detectable in upper and lower respiratory specimens during the acute phase of infection. The lowest concentration of SARS-CoV-2 viral copies this assay can detect is 250 copies / mL. Arthea Nobel negative result does not preclude SARS-CoV-2 infection and should not be used as the sole basis for treatment or other patient management decisions.  Bailea Beed negative result may occur with improper specimen collection / handling, submission of specimen other than nasopharyngeal swab, presence of viral mutation(s) within the areas targeted by this assay, and inadequate number of viral copies (<250 copies / mL). Adair Lemar negative result must be combined with clinical observations, patient history, and epidemiological information.  Fact Sheet for Patients:   RoadLapTop.co.za  Fact Sheet for Healthcare Providers: http://kim-miller.com/  This test is not yet approved or  cleared by the  [place] [employer] and has been authorized for detection and/or diagnosis of SARS-CoV-2 by FDA under an TEFL teacher (EUA).  This EUA will remain in effect (meaning this test can be used) for the duration of the COVID-19 declaration under Section 564(b)(1) of the Act, 21 U.S.C. section 360bbb-3(b)(1), unless the authorization is terminated or revoked sooner.  Performed at [facility], [address] [place], [place] [address]   Urine Culture     Status: Abnormal   Collection Time: 07/18/22  9:02 AM   Specimen: Urine, Clean Catch  Result Value Ref Range Status   Specimen Description   Final    URINE, CLEAN CATCH Performed at [facility], [address], [place], [place] [address]    Special Requests   Final    NONE Performed at [facility], [address] [place], [place] [address]    Culture 30,000 COLONIES/mL ESCHERICHIA COLI (Marqui Formby)  Final   Report Status 07/20/2022 FINAL  Final   Organism ID, Bacteria ESCHERICHIA COLI (Abdulhadi Stopa)  Final      Susceptibility   Escherichia coli - MIC*    AMPICILLIN <=2 SENSITIVE Sensitive     CEFAZOLIN <=4 SENSITIVE Sensitive     CEFEPIME <=0.12 SENSITIVE Sensitive     CEFTRIAXONE <=0.25 SENSITIVE Sensitive     CIPROFLOXACIN <=0.25 SENSITIVE Sensitive     GENTAMICIN <=1 SENSITIVE Sensitive     IMIPENEM <=0.25 SENSITIVE Sensitive     NITROFURANTOIN <=16 SENSITIVE Sensitive     TRIMETH/SULFA <=20 SENSITIVE Sensitive     AMPICILLIN/SULBACTAM <=2 SENSITIVE Sensitive     PIP/TAZO <=4 SENSITIVE Sensitive     * 30,000 COLONIES/mL ESCHERICHIA COLI  Blood Culture (routine x 2)     Status: None (Preliminary result)   Collection Time: 07/18/22 12:52 PM   Specimen: BLOOD  Result Value Ref Range Status   Specimen Description   Final    BLOOD BLOOD RIGHT ARM Performed at [facility], [address], [place], [place] [address]    Special Requests   Final    BOTTLES DRAWN AEROBIC AND ANAEROBIC Blood Culture adequate volume Performed at [facility], [address], [place], [place] [address]    Culture   Final    NO GROWTH 3 DAYS Performed at [facility], [address], [place], [place] [address]    Report Status PENDING  Incomplete  Blood Culture (routine x 2)     Status: None (Preliminary result)   Collection Time: 07/18/22 12:52 PM   Specimen: BLOOD RIGHT WRIST  Result Value Ref Range Status   Specimen Description   Final    BLOOD RIGHT WRIST Performed at [facility], [address] [place], [place] [address]    Special Requests   Final    BOTTLES DRAWN AEROBIC AND  ANAEROBIC Blood Culture adequate volume Performed at [facility], [address], [place], [place] [address]    Culture   Final    NO GROWTH 3 DAYS Performed at [facility], [address] [place], [place] [address]    Report Status PENDING  Incomplete     Labs: Basic Metabolic Panel: Recent Labs  Lab 07/18/22 0938 07/19/22 0440  NA 136 139  K 3.7 3.5  CL 102 111  CO2 28 22  GLUCOSE 123* 127*  BUN 10 8  CREATININE 1.14* 0.82  CALCIUM 9.4 7.7*   Liver Function Tests: Recent Labs  Lab 07/18/22 0938  AST  19  ALT 18  ALKPHOS 66  BILITOT 0.8  PROT 7.1  ALBUMIN 3.9   Recent Labs  Lab 07/18/22 0938  LIPASE <10*   No results for input(s): "AMMONIA" in the last 168 hours. CBC: Recent Labs  Lab 07/18/22 0938 07/19/22 0440  WBC 11.8* 8.5  NEUTROABS 9.4*  --   HGB 12.8 10.0*  HCT 39.2 31.5*  MCV 91.8 95.2  PLT 170 153   Cardiac Enzymes: No results for input(s): "CKTOTAL", "CKMB", "CKMBINDEX", "TROPONINI" in the last 168 hours. BNP: BNP (last 3 results) No results for input(s): "BNP" in the last 8760 hours.  ProBNP (last 3 results) No results for input(s): "PROBNP" in the last 8760 hours.  CBG: Recent Labs  Lab 07/21/22 0452  GLUCAP 100*       Signed:  Lacretia Nicks MD.  [facility] 07/21/2022, 3:07 PM

## 2022-07-21 NOTE — TOC Transition Note (Signed)
Transition of Care [facility]) - CM/SW Discharge Note   Patient Details  Name: Maria Logan MRN: [id number] Date of Birth: [birth year]  [facility]) CM/SW Contact:  Larrie Kass, LCSW Phone Number: 07/21/2022, 3:20 PM   Clinical Narrative:    CSW spoke with pt about rec for rolling walker. Dan Humphreys will be delivered to pt's room prior to d/c. Pt reports no need for transportation assistance. No further TOC need , TOC sign off.    Final next level of care: Home/Self Care Barriers to Discharge: No Barriers Identified   Patient Goals and CMS Choice      Discharge Placement                      Patient and family notified of of transfer: 07/21/22  Discharge Plan and Services Additional resources added to the After Visit Summary for                                       Social Determinants of Health (SDOH) Interventions SDOH Screenings   Food Insecurity: No Food Insecurity (07/18/2022)  Housing: Low Risk  (07/18/2022)  Transportation Needs: No Transportation Needs (07/18/2022)  Utilities: Not At Risk (07/18/2022)  Depression (PHQ2-9): Medium Risk (01/17/2019)  Social Connections: Unknown (06/29/2022)   Received from [facility]  Tobacco Use: Low Risk  (07/18/2022)     Readmission Risk Interventions     No data to display

## 2022-07-21 NOTE — Plan of Care (Signed)

## 2022-07-22 LAB — CULTURE, BLOOD (ROUTINE X 2)
Special Requests: ADEQUATE
Special Requests: ADEQUATE

## 2022-07-23 LAB — CULTURE, BLOOD (ROUTINE X 2)
Culture: NO GROWTH
Culture: NO GROWTH

## 2022-07-26 DIAGNOSIS — M5412 Radiculopathy, cervical region: Secondary | ICD-10-CM | POA: Diagnosis not present

## 2022-07-26 DIAGNOSIS — Z79899 Other long term (current) drug therapy: Secondary | ICD-10-CM | POA: Diagnosis not present

## 2022-07-26 DIAGNOSIS — R350 Frequency of micturition: Secondary | ICD-10-CM | POA: Diagnosis not present

## 2022-07-26 DIAGNOSIS — Z79891 Long term (current) use of opiate analgesic: Secondary | ICD-10-CM | POA: Diagnosis not present

## 2022-07-26 DIAGNOSIS — R52 Pain, unspecified: Secondary | ICD-10-CM | POA: Diagnosis not present

## 2022-07-26 DIAGNOSIS — M79642 Pain in left hand: Secondary | ICD-10-CM | POA: Diagnosis not present

## 2022-07-26 DIAGNOSIS — N390 Urinary tract infection, site not specified: Secondary | ICD-10-CM | POA: Diagnosis not present

## 2022-07-26 DIAGNOSIS — M542 Cervicalgia: Secondary | ICD-10-CM | POA: Diagnosis not present

## 2022-07-26 DIAGNOSIS — M79602 Pain in left arm: Secondary | ICD-10-CM | POA: Diagnosis not present

## 2022-07-26 DIAGNOSIS — G894 Chronic pain syndrome: Secondary | ICD-10-CM | POA: Diagnosis not present

## 2022-07-26 DIAGNOSIS — B9620 Unspecified Escherichia coli [E. coli] as the cause of diseases classified elsewhere: Secondary | ICD-10-CM | POA: Diagnosis not present

## 2022-08-03 ENCOUNTER — Ambulatory Visit: Payer: Medicaid Other | Attending: Family Medicine

## 2022-08-03 DIAGNOSIS — M5432 Sciatica, left side: Secondary | ICD-10-CM | POA: Insufficient documentation

## 2022-08-03 DIAGNOSIS — M5416 Radiculopathy, lumbar region: Secondary | ICD-10-CM | POA: Insufficient documentation

## 2022-08-03 NOTE — Therapy (Signed)
OUTPATIENT PHYSICAL THERAPY THORACOLUMBAR EVALUATION   Patient Name: Maria Logan MRN: [id number] DOB:December 24, 1965, 57 y.o., female Today's Date: 08/03/22  END OF SESSION:  PT End of Session - 08/03/22 1506     Visit Number 1    Number of Visits 12    Date for PT Re-Evaluation 09/14/22    Authorization Type [employer]    PT Start Time 1450    PT Stop Time 1525    PT Time Calculation (min) 35 min    Activity Tolerance Patient limited by pain    Behavior During Therapy [facility] for tasks assessed/performed             Past Medical History:  Diagnosis Date   Fibroids    Umbilical hernia    CT 02/25/15   Vaginal delivery 1988, 1990, 2005   Past Surgical History:  Procedure Laterality Date   ABDOMINAL HYSTERECTOMY N/A 12/23/2014   Procedure: HYSTERECTOMY ABDOMINAL;  Surgeon: Adam Phenix, MD;  Location: [facility];  Service: Gynecology;  Laterality: N/A;  Requested 12/23/14 @ 1:00p   BILATERAL SALPINGECTOMY Bilateral 12/23/2014   Procedure: BILATERAL SALPINGECTOMY;  Surgeon: Adam Phenix, MD;  Location: [facility];  Service: Gynecology;  Laterality: Bilateral;   EXAMINATION UNDER ANESTHESIA N/A 06/04/2014   Procedure: EXAM UNDER ANESTHESIA;  Surgeon: Lesly Dukes, MD;  Location: [facility];  Service: Gynecology;  Laterality: N/A;   OPERATIVE ULTRASOUND N/A 06/04/2014   Procedure: OPERATIVE ULTRASOUND;  Surgeon: Lesly Dukes, MD;  Location: [facility];  Service: Gynecology;  Laterality: N/A;   Patient Active Problem List   Diagnosis Date Noted   Pyelonephritis 07/18/2022   Hypertension 02/02/2019   S/P TAH (total abdominal hysterectomy) 12/24/2014    PCP: Fleet Contras, MD  REFERRING PROVIDER: Zigmund Daniel., MD  REFERRING DIAG: Sciatica of left side [M54.32]   Rationale for Evaluation and Treatment: Rehabilitation  THERAPY DIAG:  Radiculopathy, lumbar region  ONSET DATE: 2 months   SUBJECTIVE:                                                                                                                                                                                            SUBJECTIVE STATEMENT: Patient reports to PT with L sided lower back pain that started about 2 months ago. She was also having tingling pain her Lt great toe and radiating pain throughout her LE. She did receive an MRI related to this pain (findings below). She was hospitalized after that on 07/18/22 for Rt kidney infection.   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  PERTINENT HISTORY:  PMHx includes HTN  PAIN:  Are you having pain? Yes: NPRS scale: 10/10 Pain location: L lower back and L LE Pain description: stiffness, stabbing, shooting, numbness/tingling  Aggravating factors: prolonged sitting, bed mobility, getting dressed Relieving factors: medicine, laying down    PRECAUTIONS: None  RED FLAGS: Bowel or bladder incontinence: Yes: related to UTI/kidney infection    WEIGHT BEARING RESTRICTIONS: No  FALLS:  Has patient fallen in last 6 months? No  LIVING ENVIRONMENT: Lives with: lives alone Lives in: House/apartment Stairs: Yes: Internal: 15 steps; on left going up Has following equipment at home: None  OCCUPATION: Cleaning/Housekeeping  PLOF: Independent and Independent with basic ADLs  PATIENT GOALS: Patient would like to return to normal daily activities, esp cleaning tasks without exacerbation of symptoms.   NEXT MD VISIT: not scheduled at time of eval    OBJECTIVE:   DIAGNOSTIC FINDINGS:  07/20/22 MRI Lumbar Spine WO Contrast   T/L spine showing congenitally short pedicales, mild bilateral neural foraminal narrowing at L1-2, L2-3, L3-4, and L4-5.  L foraminal disc protrusion at L3-4 may contact exiting L L3 nerve.  Mild bilateral foraminal narrowing at t9-10, T10-11, and T11-12.  No significant spinal canal stenosis in thoracic or lumbar spine.  PATIENT SURVEYS:  FOTO 10 current, 39  predicted    COGNITION: Overall cognitive status: Within functional limits for tasks assessed    SENSATION: Not tested  MUSCLE LENGTH: Thomas test: not tested   POSTURE: rounded shoulders, increased lumbar lordosis, anterior pelvic tilt, and flexed trunk  noted in seated and standing   PALPATION: Not tested.   LUMBAR ROM:   AROM eval  Flexion 65%  Extension 10%, p!  Right lateral flexion 50% p!  Left lateral flexion 60%  Right rotation 30%  Left rotation 20%, p!   (Blank rows = not tested)   LOWER EXTREMITY MMT:    MMT Right eval Left eval  Hip flexion 4+ 3+  Hip extension    Hip abduction 4+ 3+  Hip adduction    Hip internal rotation    Hip external rotation    Knee flexion    Knee extension 4 4-  Ankle dorsiflexion 5 3+  Ankle plantarflexion    Ankle inversion    Ankle eversion     (Blank rows = not tested)  LUMBAR SPECIAL TESTS:  Not tested   FUNCTIONAL TESTS:  Not tested   GAIT: Distance walked: 50 ft Assistive device utilized: None Level of assistance: Complete Independence Comments: forward flexed posture noted and slow gait speed consistent with pain severity   TODAY'S TREATMENT:                                                                                                                               [facility] Adult PT Treatment:  DATE: 08/03/22  Therapeutic Exercise: Diaphragmatic breathing Seated repeated lumbar flexion/table slide  with 3 sec hold each x 10  Patient education and HEP review as noted below     PATIENT EDUCATION:  Education details: reviewed initial home exercise program; discussion of POC, prognosis and goals for skilled PT   Person educated: Patient Education method: Explanation, Demonstration, and Handouts Education comprehension: verbalized understanding, returned demonstration, and needs further education  HOME EXERCISE PROGRAM: Access Code: [email or web address] URL:  [email or web address] Date: 08/04/2022 Prepared by: Mauri Reading  Exercises - Seated Diaphragmatic Breathing  - 2 x daily - 7 x weekly - 1 sets - 10 reps - Seated Flexion Stretch  - 2 x daily - 7 x weekly - 1 sets - 10 reps - 3 sec hold  ASSESSMENT:  CLINICAL IMPRESSION: Patient is a 57 y.o. female  who was seen today for physical therapy evaluation and treatment for Persistent Low Back Pain with radiating symptoms to Left LE. She is demonstrating decreased LE strength, diminished lumbar AROM, decreased core strength/stabilization. She has related pain and difficulty with bending, lifting, heavy carrying, pushing/pulling, performance of normal household activities occupational duties. She requires skilled PT services at this time to address relevant deficits and improve overall function.     OBJECTIVE IMPAIRMENTS: decreased activity tolerance, decreased endurance, decreased ROM, decreased strength, improper body mechanics, postural dysfunction, and pain.   ACTIVITY LIMITATIONS: carrying, lifting, bending, squatting, sleeping, stairs, dressing, locomotion level, and caring for others  PARTICIPATION LIMITATIONS: meal prep, cleaning, laundry, interpersonal relationship, driving, shopping, community activity, and occupation  PERSONAL FACTORS: Past/current experiences, Profession, Time since onset of injury/illness/exacerbation, and 1 comorbidity: PMHx includes HTN  are also affecting patient's functional outcome.   REHAB POTENTIAL: Fair    CLINICAL DECISION MAKING: Evolving/moderate complexity  EVALUATION COMPLEXITY: Moderate   GOALS: Goals reviewed with patient? Yes  SHORT TERM GOALS: Target date: 08/25/2022  Patient will be independent with initial home program for pain modulation, lumbar mobility and LE strengthening.  Baseline: initiated at eval  Goal status: INITIAL  2.  Patient will demonstrate improved postural awareness for at least 15 minutes while seated  without need for cueing from PT.   Baseline: see objective measures  Goal status: INITIAL   LONG TERM GOALS: Target date: 09/14/2022   Patient will report improved overall functional ability with FOTO score of 39 or greater.  Baseline: 10 Goal status: INITIAL  2.  Patient will demonstrate improved LS AROM by at least 20% full range in all directions.  Baseline: see objective measures  Goal status: INITIAL  3.  Patient will demonstrate at least 4+/5 LE MMT BIL.  Baseline: see objective measures Goal status: INITIAL  4.  Patient will demonstrate ability to push/pull at least 20# with proper body mechanics and with no more than minimal pain severity in order to return to PLOF.  Baseline: unable to perform without severe pain.  Goal status: INITIAL  5.  Patient will demonstrate ability to perform squatting with proper body mechanics for floor-to-counter lifting of at least 10#.  Baseline: increases pain severity  Goal status: INITIAL   PLAN:  PT FREQUENCY: 1-2x/week  PT DURATION: 6 weeks  PLANNED INTERVENTIONS: Therapeutic exercises, Therapeutic activity, Neuromuscular re-education, Patient/Family education, Self Care, Joint mobilization, Aquatic Therapy, Spinal mobilization, Cryotherapy, Moist heat, Taping, Manual therapy, and Re-evaluation.  PLAN FOR NEXT SESSION: continue with pain modulation activities including aerobic activity, repeated lumbar flexion, neutral core mm activation/stabilization activities, LE strengthening, manual therapy as indicated, review HEP and  progress as able    Check all possible CPT codes: 57846 - PT Re-evaluation, 97110- Therapeutic Exercise, [phone]- Neuro Re-education, [phone] - Manual Therapy, 97530 - Therapeutic Activities, [phone] - Self Care, and [phone] - Aquatic therapy    Check all conditions that are expected to impact treatment: {Conditions expected to impact treatment:None of these apply   If treatment provided at initial evaluation, no  treatment charged due to lack of authorization.       Mauri Reading, PT, DPT 08/04/2022, 9:09 AM

## 2022-08-08 DIAGNOSIS — N951 Menopausal and female climacteric states: Secondary | ICD-10-CM | POA: Diagnosis not present

## 2022-08-08 DIAGNOSIS — N10 Acute tubulo-interstitial nephritis: Secondary | ICD-10-CM | POA: Diagnosis not present

## 2022-08-08 DIAGNOSIS — F430 Acute stress reaction: Secondary | ICD-10-CM | POA: Diagnosis not present

## 2022-08-09 ENCOUNTER — Other Ambulatory Visit: Payer: Self-pay | Admitting: Internal Medicine

## 2022-08-09 DIAGNOSIS — N3011 Interstitial cystitis (chronic) with hematuria: Secondary | ICD-10-CM | POA: Diagnosis not present

## 2022-08-09 DIAGNOSIS — Z1239 Encounter for other screening for malignant neoplasm of breast: Secondary | ICD-10-CM | POA: Diagnosis not present

## 2022-08-09 DIAGNOSIS — Z0000 Encounter for general adult medical examination without abnormal findings: Secondary | ICD-10-CM | POA: Diagnosis not present

## 2022-08-09 DIAGNOSIS — E559 Vitamin D deficiency, unspecified: Secondary | ICD-10-CM | POA: Diagnosis not present

## 2022-08-09 DIAGNOSIS — Z131 Encounter for screening for diabetes mellitus: Secondary | ICD-10-CM | POA: Diagnosis not present

## 2022-08-09 DIAGNOSIS — Z13220 Encounter for screening for lipoid disorders: Secondary | ICD-10-CM | POA: Diagnosis not present

## 2022-08-09 DIAGNOSIS — M5442 Lumbago with sciatica, left side: Secondary | ICD-10-CM | POA: Diagnosis not present

## 2022-08-11 ENCOUNTER — Ambulatory Visit: Payer: Medicaid Other | Attending: Family Medicine

## 2022-08-11 DIAGNOSIS — M5416 Radiculopathy, lumbar region: Secondary | ICD-10-CM | POA: Insufficient documentation

## 2022-08-11 NOTE — Therapy (Signed)
OUTPATIENT PHYSICAL THERAPY TREATMENT NOTE   Patient Name: Maria Logan MRN: [id number] DOB:Nov 27, 1965, 57 y.o., female Today's Date: 08/03/22  END OF SESSION:  PT End of Session - 08/11/22 0832     Visit Number 2    Number of Visits 12    Date for PT Re-Evaluation 09/14/22    Authorization Type [employer]    Authorization Time Period 5 visit 08/08/22-10/06/22    Authorization - Visit Number 1    Authorization - Number of Visits 5    PT Start Time 0831    PT Stop Time 0911    PT Time Calculation (min) 40 min    Activity Tolerance Patient limited by pain    Behavior During Therapy [facility] for tasks assessed/performed              Past Medical History:  Diagnosis Date   Fibroids    Umbilical hernia    CT 02/25/15   Vaginal delivery 1988, 1990, 2005   Past Surgical History:  Procedure Laterality Date   ABDOMINAL HYSTERECTOMY N/A 12/23/2014   Procedure: HYSTERECTOMY ABDOMINAL;  Surgeon: Adam Phenix, MD;  Location: [facility];  Service: Gynecology;  Laterality: N/A;  Requested 12/23/14 @ 1:00p   BILATERAL SALPINGECTOMY Bilateral 12/23/2014   Procedure: BILATERAL SALPINGECTOMY;  Surgeon: Adam Phenix, MD;  Location: [facility];  Service: Gynecology;  Laterality: Bilateral;   EXAMINATION UNDER ANESTHESIA N/A 06/04/2014   Procedure: EXAM UNDER ANESTHESIA;  Surgeon: Lesly Dukes, MD;  Location: [facility];  Service: Gynecology;  Laterality: N/A;   OPERATIVE ULTRASOUND N/A 06/04/2014   Procedure: OPERATIVE ULTRASOUND;  Surgeon: Lesly Dukes, MD;  Location: [facility];  Service: Gynecology;  Laterality: N/A;   Patient Active Problem List   Diagnosis Date Noted   Pyelonephritis 07/18/2022   Hypertension 02/02/2019   S/P TAH (total abdominal hysterectomy) 12/24/2014    PCP: Fleet Contras, MD  REFERRING PROVIDER: Zigmund Daniel., MD  REFERRING DIAG: Sciatica of left side [M54.32]   Rationale for Evaluation and Treatment: Rehabilitation  THERAPY DIAG:   Radiculopathy, lumbar region  ONSET DATE: 2 months   SUBJECTIVE:                                                                                                                                                                                           SUBJECTIVE STATEMENT: Patient reports that when her back is feeling ok that she tends to overdo it. She said that she is off of work this week.    PERTINENT HISTORY:  PMHx includes HTN  PAIN:  Are you having pain? Yes: NPRS scale:  7/10 Pain location: L lower back and L LE Pain description: stiffness, stabbing, shooting, numbness/tingling  Aggravating factors: prolonged sitting, bed mobility, getting dressed Relieving factors: medicine, laying down    PRECAUTIONS: None  RED FLAGS: Bowel or bladder incontinence: Yes: related to UTI/kidney infection    WEIGHT BEARING RESTRICTIONS: No  FALLS:  Has patient fallen in last 6 months? No  LIVING ENVIRONMENT: Lives with: lives alone Lives in: House/apartment Stairs: Yes: Internal: 15 steps; on left going up Has following equipment at home: None  OCCUPATION: Cleaning/Housekeeping  PLOF: Independent and Independent with basic ADLs  PATIENT GOALS: Patient would like to return to normal daily activities, esp cleaning tasks without exacerbation of symptoms.   NEXT MD VISIT: not scheduled at time of eval    OBJECTIVE:   DIAGNOSTIC FINDINGS:  07/20/22 MRI Lumbar Spine WO Contrast   T/L spine showing congenitally short pedicales, mild bilateral neural foraminal narrowing at L1-2, L2-3, L3-4, and L4-5.  L foraminal disc protrusion at L3-4 may contact exiting L L3 nerve.  Mild bilateral foraminal narrowing at t9-10, T10-11, and T11-12.  No significant spinal canal stenosis in thoracic or lumbar spine.  PATIENT SURVEYS:  FOTO 10 current, 39 predicted    COGNITION: Overall cognitive status: Within functional limits for tasks assessed    SENSATION: Not tested  MUSCLE  LENGTH: Thomas test: not tested   POSTURE: rounded shoulders, increased lumbar lordosis, anterior pelvic tilt, and flexed trunk  noted in seated and standing   PALPATION: Not tested.   LUMBAR ROM:   AROM eval  Flexion 65%  Extension 10%, p!  Right lateral flexion 50% p!  Left lateral flexion 60%  Right rotation 30%  Left rotation 20%, p!   (Blank rows = not tested)   LOWER EXTREMITY MMT:    MMT Right eval Left eval  Hip flexion 4+ 3+  Hip extension    Hip abduction 4+ 3+  Hip adduction    Hip internal rotation    Hip external rotation    Knee flexion    Knee extension 4 4-  Ankle dorsiflexion 5 3+  Ankle plantarflexion    Ankle inversion    Ankle eversion     (Blank rows = not tested)  LUMBAR SPECIAL TESTS:  Not tested   FUNCTIONAL TESTS:  Not tested   GAIT: Distance walked: 50 ft Assistive device utilized: None Level of assistance: Complete Independence Comments: forward flexed posture noted and slow gait speed consistent with pain severity   TODAY'S TREATMENT:         [facility] Adult PT Treatment:                                                DATE: 08/11/22 Therapeutic Exercise: Standing hip abduction/extension x10 BIL Standing marching 2x30" Heel/toe raises x10 Seated marching x30" LAQ x10 BIL Seated hip adduction 2x10 PPT - heavy cues for form PPT with marching x30" Supine sciatic nerve glide x10 Lt Supine hamstring stretch x30" BIL Bridges 2x10 (small ROM) LTR x10 BIL Sidelying open books x10 BIL Self Care: Activity pacing  Medication management - reaching out to MD regarding medication combination/time of day to take meds to alleviate symptoms of fatigue  [facility] Adult PT Treatment:                                                DATE: 08/03/22  Therapeutic Exercise: Diaphragmatic breathing Seated repeated lumbar flexion/table  slide  with 3 sec hold each x 10  Patient education and HEP review as noted below     PATIENT EDUCATION:  Education details: reviewed initial home exercise program; discussion of POC, prognosis and goals for skilled PT   Person educated: Patient Education method: Explanation, Demonstration, and Handouts Education comprehension: verbalized understanding, returned demonstration, and needs further education  HOME EXERCISE PROGRAM: Access Code: [email or web address] URL: [email or web address] Date: 08/04/2022 Prepared by: Mauri Reading  Exercises - Seated Diaphragmatic Breathing  - 2 x daily - 7 x weekly - 1 sets - 10 reps - Seated Flexion Stretch  - 2 x daily - 7 x weekly - 1 sets - 10 reps - 3 sec hold  ASSESSMENT:  CLINICAL IMPRESSION: Patient presents to PT reporting continued lower back pain that radiates down her LLE. She reports HEP compliance and that she is off of work this week, but that her pain still persists. Session today focused on gentle proximal hip, LE, and core strengthening as well as gentle lumbar mobility. She is limited by pain throughout session, working in a small ROM with slow, guarded movements. Patient continues to benefit from skilled PT services and should be progressed as able to improve functional independence.     OBJECTIVE IMPAIRMENTS: decreased activity tolerance, decreased endurance, decreased ROM, decreased strength, improper body mechanics, postural dysfunction, and pain.   ACTIVITY LIMITATIONS: carrying, lifting, bending, squatting, sleeping, stairs, dressing, locomotion level, and caring for others  PARTICIPATION LIMITATIONS: meal prep, cleaning, laundry, interpersonal relationship, driving, shopping, community activity, and occupation  PERSONAL FACTORS: Past/current experiences, Profession, Time since onset of injury/illness/exacerbation, and 1 comorbidity: PMHx includes HTN  are also affecting patient's functional outcome.   REHAB POTENTIAL:  Fair    CLINICAL DECISION MAKING: Evolving/moderate complexity  EVALUATION COMPLEXITY: Moderate   GOALS: Goals reviewed with patient? Yes  SHORT TERM GOALS: Target date: 08/25/2022  Patient will be independent with initial home program for pain modulation, lumbar mobility and LE strengthening.  Baseline: initiated at eval  Goal status: INITIAL  2.  Patient will demonstrate improved postural awareness for at least 15 minutes while seated without need for cueing from PT.   Baseline: see objective measures  Goal status: INITIAL   LONG TERM GOALS: Target date: 09/14/2022   Patient will report improved overall functional ability with FOTO score of 39 or greater.  Baseline: 10 Goal status: INITIAL  2.  Patient will demonstrate improved LS AROM by at least 20% full range in all directions.  Baseline: see objective measures  Goal status: INITIAL  3.  Patient will demonstrate at least 4+/5 LE MMT BIL.  Baseline: see objective measures Goal status: INITIAL  4.  Patient will demonstrate ability to push/pull at least 20# with proper body mechanics and with no more than minimal pain severity in order to return to PLOF.  Baseline: unable to perform without severe pain.  Goal status: INITIAL  5.  Patient will demonstrate ability to perform squatting with proper body mechanics for floor-to-counter lifting of at least 10#.  Baseline: increases pain severity  Goal status: INITIAL   PLAN:  PT FREQUENCY: 1-2x/week  PT  DURATION: 6 weeks  PLANNED INTERVENTIONS: Therapeutic exercises, Therapeutic activity, Neuromuscular re-education, Patient/Family education, Self Care, Joint mobilization, Aquatic Therapy, Spinal mobilization, Cryotherapy, Moist heat, Taping, Manual therapy, and Re-evaluation.  PLAN FOR NEXT SESSION: continue with pain modulation activities including aerobic activity, repeated lumbar flexion, neutral core mm activation/stabilization activities, LE strengthening, manual  therapy as indicated, review HEP and progress as able    Check all possible CPT codes: 86578 - PT Re-evaluation, 97110- Therapeutic Exercise, [id number]- Neuro Re-education, 97140 - Manual Therapy, 97530 - Therapeutic Activities, [phone] - Self Care, and [phone] - Aquatic therapy    Check all conditions that are expected to impact treatment: {Conditions expected to impact treatment:None of these apply   If treatment provided at initial evaluation, no treatment charged due to lack of authorization.       Berta Minor, PTA 08/11/2022, 9:18 AM

## 2022-08-16 ENCOUNTER — Ambulatory Visit: Payer: Medicaid Other | Admitting: Physical Therapy

## 2022-08-16 ENCOUNTER — Encounter: Payer: Medicaid Other | Admitting: Physical Therapy

## 2022-08-16 ENCOUNTER — Encounter: Payer: Self-pay | Admitting: Physical Therapy

## 2022-08-16 DIAGNOSIS — M5416 Radiculopathy, lumbar region: Secondary | ICD-10-CM | POA: Diagnosis not present

## 2022-08-16 NOTE — Therapy (Signed)
OUTPATIENT PHYSICAL THERAPY TREATMENT NOTE   Patient Name: Maria Logan MRN: [id number] DOB:1965/09/16, 57 y.o., female Today's Date: 08/03/22  END OF SESSION:  PT End of Session - 08/16/22 0838     Visit Number 3    Number of Visits 12    Date for PT Re-Evaluation 09/14/22    Authorization Type [employer]    Authorization Time Period 5 visit 08/08/22-10/06/22    Authorization - Visit Number 2    Authorization - Number of Visits 5    PT Start Time [phone]   pt arrived late   PT Stop Time 0915    PT Time Calculation (min) 38 min    Activity Tolerance Patient limited by pain    Behavior During Therapy [facility] for tasks assessed/performed;Restless              Past Medical History:  Diagnosis Date   Fibroids    Umbilical hernia    CT 02/25/15   Vaginal delivery 1988, 1990, 2005   Past Surgical History:  Procedure Laterality Date   ABDOMINAL HYSTERECTOMY N/A 12/23/2014   Procedure: HYSTERECTOMY ABDOMINAL;  Surgeon: Adam Phenix, MD;  Location: [facility];  Service: Gynecology;  Laterality: N/A;  Requested 12/23/14 @ 1:00p   BILATERAL SALPINGECTOMY Bilateral 12/23/2014   Procedure: BILATERAL SALPINGECTOMY;  Surgeon: Adam Phenix, MD;  Location: [facility];  Service: Gynecology;  Laterality: Bilateral;   EXAMINATION UNDER ANESTHESIA N/A 06/04/2014   Procedure: EXAM UNDER ANESTHESIA;  Surgeon: Lesly Dukes, MD;  Location: [facility];  Service: Gynecology;  Laterality: N/A;   OPERATIVE ULTRASOUND N/A 06/04/2014   Procedure: OPERATIVE ULTRASOUND;  Surgeon: Lesly Dukes, MD;  Location: [facility];  Service: Gynecology;  Laterality: N/A;   Patient Active Problem List   Diagnosis Date Noted   Pyelonephritis 07/18/2022   Hypertension 02/02/2019   S/P TAH (total abdominal hysterectomy) 12/24/2014    PCP: Fleet Contras, MD  REFERRING PROVIDER: Zigmund Daniel., MD  REFERRING DIAG: Sciatica of left side [M54.32]   Rationale for Evaluation and Treatment:  Rehabilitation  THERAPY DIAG:  Radiculopathy, lumbar region  ONSET DATE: 2 months   SUBJECTIVE:                                                                                                                                                                                           SUBJECTIVE STATEMENT: Pt reports that she continues to have L LE radicular pain.  She feels that there has been some improvement.    PERTINENT HISTORY:  PMHx includes HTN  PAIN:  Are you having pain? Yes: NPRS scale: 7/10  Pain location: L lower back and L LE Pain description: stiffness, stabbing, shooting, numbness/tingling  Aggravating factors: prolonged sitting, bed mobility, getting dressed Relieving factors: medicine, laying down    PRECAUTIONS: None  RED FLAGS: Bowel or bladder incontinence: Yes: related to UTI/kidney infection    WEIGHT BEARING RESTRICTIONS: No  FALLS:  Has patient fallen in last 6 months? No  LIVING ENVIRONMENT: Lives with: lives alone Lives in: House/apartment Stairs: Yes: Internal: 15 steps; on left going up Has following equipment at home: None  OCCUPATION: Cleaning/Housekeeping  PLOF: Independent and Independent with basic ADLs  PATIENT GOALS: Patient would like to return to normal daily activities, esp cleaning tasks without exacerbation of symptoms.   NEXT MD VISIT: not scheduled at time of eval    OBJECTIVE:   DIAGNOSTIC FINDINGS:  07/20/22 MRI Lumbar Spine WO Contrast   T/L spine showing congenitally short pedicales, mild bilateral neural foraminal narrowing at L1-2, L2-3, L3-4, and L4-5.  L foraminal disc protrusion at L3-4 may contact exiting L L3 nerve.  Mild bilateral foraminal narrowing at t9-10, T10-11, and T11-12.  No significant spinal canal stenosis in thoracic or lumbar spine.  PATIENT SURVEYS:  FOTO 10 current, 39 predicted    COGNITION: Overall cognitive status: Within functional limits for tasks assessed    SENSATION: Not  tested  MUSCLE LENGTH: Thomas test: not tested   POSTURE: rounded shoulders, increased lumbar lordosis, anterior pelvic tilt, and flexed trunk  noted in seated and standing   PALPATION: Not tested.   LUMBAR ROM:   AROM eval  Flexion 65%  Extension 10%, p!  Right lateral flexion 50% p!  Left lateral flexion 60%  Right rotation 30%  Left rotation 20%, p!   (Blank rows = not tested)   LOWER EXTREMITY MMT:    MMT Right eval Left eval L 8/6  Hip flexion 4+ 3+ 3+  Hip extension     Hip abduction 4+ 3+   Hip adduction     Hip internal rotation     Hip external rotation     Knee flexion     Knee extension 4 4-   Ankle dorsiflexion 5 3+ 4-  Ankle plantarflexion     Ankle inversion     Ankle eversion      (Blank rows = not tested)  LUMBAR SPECIAL TESTS:  Not tested   FUNCTIONAL TESTS:  Not tested   GAIT: Distance walked: 50 ft Assistive device utilized: None Level of assistance: Complete Independence Comments: forward flexed posture noted and slow gait speed consistent with pain severity   TODAY'S TREATMENT:         [facility] Adult PT Treatment:                                                DATE: 08/16/22 Therapeutic Exercise: Seated ball roll out C/L/R - 10x ea LTR - 20x Hip flexion - Blue TB in HL - 2x10 S/L R QL stretch - not tolerated d/t pain PPT - 2x10 Alternating clam - blue TB - 2x10 ea Toe raises at wall - 2x10  [facility] Adult PT Treatment:                                                DATE: 08/03/22  Therapeutic Exercise: Diaphragmatic breathing Seated repeated lumbar flexion/table slide  with 3 sec hold each x 10  Patient education and HEP review as noted below     PATIENT EDUCATION:  Education details: reviewed initial home exercise program; discussion of POC, prognosis and goals for skilled PT   Person educated:  Patient Education method: Explanation, Demonstration, and Handouts Education comprehension: verbalized understanding, returned demonstration, and needs further education  HOME EXERCISE PROGRAM: Access Code: [email or web address] URL: [email or web address] Date: 08/04/2022 Prepared by: Mauri Reading  Exercises - Seated Diaphragmatic Breathing  - 2 x daily - 7 x weekly - 1 sets - 10 reps - Seated Flexion Stretch  - 2 x daily - 7 x weekly - 1 sets - 10 reps - 3 sec hold  ASSESSMENT:  CLINICAL IMPRESSION: Hanifa tolerated session fair with no adverse reaction.  Continued with gentle ROM with core and hip strengthening.  Pt remains limited by L LE pain throughout the session.  She reports no consistent difference in sxs between flexed vs extended positions today.  She does show an improvement in DF strength on testing today.    OBJECTIVE IMPAIRMENTS: decreased activity tolerance, decreased endurance, decreased ROM, decreased strength, improper body mechanics, postural dysfunction, and pain.   ACTIVITY LIMITATIONS: carrying, lifting, bending, squatting, sleeping, stairs, dressing, locomotion level, and caring for others  PARTICIPATION LIMITATIONS: meal prep, cleaning, laundry, interpersonal relationship, driving, shopping, community activity, and occupation  PERSONAL FACTORS: Past/current experiences, Profession, Time since onset of injury/illness/exacerbation, and 1 comorbidity: PMHx includes HTN  are also affecting patient's functional outcome.   REHAB POTENTIAL: Fair    CLINICAL DECISION MAKING: Evolving/moderate complexity  EVALUATION COMPLEXITY: Moderate   GOALS: Goals reviewed with patient? Yes  SHORT TERM GOALS: Target date: 08/25/2022  Patient will be independent with initial home program for pain modulation, lumbar mobility and LE strengthening.  Baseline: initiated at eval  Goal status: INITIAL  2.  Patient will demonstrate improved postural awareness for at least 15  minutes while seated without need for cueing from PT.   Baseline: see objective measures  Goal status: INITIAL   LONG TERM GOALS: Target date: 09/14/2022   Patient will report improved overall functional ability with FOTO score of 39 or greater.  Baseline: 10 Goal status: INITIAL  2.  Patient will demonstrate improved LS AROM by at least 20% full range in all directions.  Baseline: see objective measures  Goal status: INITIAL  3.  Patient will demonstrate at least 4+/5 LE MMT BIL.  Baseline: see objective measures Goal status: INITIAL  4.  Patient will demonstrate ability to push/pull at least 20# with proper body mechanics and with no more than minimal pain severity in order to return to PLOF.  Baseline: unable to perform without severe pain.  Goal status: INITIAL  5.  Patient will demonstrate ability to perform squatting with proper body mechanics for floor-to-counter lifting of at least 10#.  Baseline: increases pain severity  Goal status: INITIAL   PLAN:  PT FREQUENCY: 1-2x/week  PT DURATION: 6 weeks  PLANNED INTERVENTIONS: Therapeutic exercises, Therapeutic activity, Neuromuscular re-education, Patient/Family education, Self Care, Joint mobilization, Aquatic Therapy, Spinal mobilization, Cryotherapy, Moist heat, Taping, Manual therapy, and Re-evaluation.  PLAN FOR NEXT SESSION: continue with pain modulation activities including aerobic activity, repeated lumbar flexion, neutral core mm activation/stabilization activities, LE strengthening, manual therapy as indicated, review HEP and progress as able    Check all possible CPT codes: 16109 - PT Re-evaluation, 97110- Therapeutic Exercise, [phone]- Neuro Re-education, 97140 - Manual Therapy, 97530 - Therapeutic Activities, [phone] - Self Care, and [phone] - Aquatic therapy    Check all conditions that are expected to impact treatment: {Conditions expected to impact treatment:None of these apply   If treatment provided at initial  evaluation, no treatment charged due to lack of authorization.       Fredderick Phenix, PT 08/16/2022, 9:20 AM

## 2022-08-18 ENCOUNTER — Ambulatory Visit: Payer: Medicaid Other

## 2022-08-23 ENCOUNTER — Ambulatory Visit: Payer: Medicaid Other

## 2022-08-23 ENCOUNTER — Encounter: Payer: Medicaid Other | Admitting: Physical Therapy

## 2022-08-23 DIAGNOSIS — M5442 Lumbago with sciatica, left side: Secondary | ICD-10-CM | POA: Diagnosis not present

## 2022-08-23 DIAGNOSIS — M5416 Radiculopathy, lumbar region: Secondary | ICD-10-CM | POA: Diagnosis not present

## 2022-08-23 DIAGNOSIS — N3011 Interstitial cystitis (chronic) with hematuria: Secondary | ICD-10-CM | POA: Diagnosis not present

## 2022-08-23 DIAGNOSIS — G894 Chronic pain syndrome: Secondary | ICD-10-CM | POA: Diagnosis not present

## 2022-08-23 DIAGNOSIS — F430 Acute stress reaction: Secondary | ICD-10-CM | POA: Diagnosis not present

## 2022-08-23 NOTE — Therapy (Signed)
OUTPATIENT PHYSICAL THERAPY TREATMENT NOTE   Patient Name: Maria Logan MRN: [id number] DOB:April 13, 1965, 57 y.o., female Today's Date: 08/03/22  END OF SESSION:  PT End of Session - 08/23/22 0845     Visit Number 4    Number of Visits 12    Date for PT Re-Evaluation 09/14/22    Authorization Type [employer]    Authorization Time Period 5 visit 08/08/22-10/06/22    Authorization - Visit Number 3    Authorization - Number of Visits 5    PT Start Time 0844   Pt arrived late   PT Stop Time 0914    PT Time Calculation (min) 30 min    Activity Tolerance Patient limited by pain    Behavior During Therapy [facility] for tasks assessed/performed;Restless               Past Medical History:  Diagnosis Date   Fibroids    Umbilical hernia    CT 02/25/15   Vaginal delivery 1988, 1990, 2005   Past Surgical History:  Procedure Laterality Date   ABDOMINAL HYSTERECTOMY N/A 12/23/2014   Procedure: HYSTERECTOMY ABDOMINAL;  Surgeon: Adam Phenix, MD;  Location: [facility];  Service: Gynecology;  Laterality: N/A;  Requested 12/23/14 @ 1:00p   BILATERAL SALPINGECTOMY Bilateral 12/23/2014   Procedure: BILATERAL SALPINGECTOMY;  Surgeon: Adam Phenix, MD;  Location: [facility];  Service: Gynecology;  Laterality: Bilateral;   EXAMINATION UNDER ANESTHESIA N/A 06/04/2014   Procedure: EXAM UNDER ANESTHESIA;  Surgeon: Lesly Dukes, MD;  Location: [facility];  Service: Gynecology;  Laterality: N/A;   OPERATIVE ULTRASOUND N/A 06/04/2014   Procedure: OPERATIVE ULTRASOUND;  Surgeon: Lesly Dukes, MD;  Location: [facility];  Service: Gynecology;  Laterality: N/A;   Patient Active Problem List   Diagnosis Date Noted   Pyelonephritis 07/18/2022   Hypertension 02/02/2019   S/P TAH (total abdominal hysterectomy) 12/24/2014    PCP: Fleet Contras, MD  REFERRING PROVIDER: Zigmund Daniel., MD  REFERRING DIAG: Sciatica of left side [M54.32]   Rationale for Evaluation and Treatment:  Rehabilitation  THERAPY DIAG:  Radiculopathy, lumbar region  ONSET DATE: 2 months   SUBJECTIVE:                                                                                                                                                                                           SUBJECTIVE STATEMENT: Patient reports that her pain continues on the L side of her lower back but that it is not going down her LLE as much today.   PERTINENT HISTORY:  PMHx includes HTN  PAIN:  Are you  having pain? Yes: NPRS scale: 6-7/10 Pain location: L lower back and L LE Pain description: stiffness, stabbing, shooting, numbness/tingling  Aggravating factors: prolonged sitting, bed mobility, getting dressed Relieving factors: medicine, laying down    PRECAUTIONS: None  RED FLAGS: Bowel or bladder incontinence: Yes: related to UTI/kidney infection    WEIGHT BEARING RESTRICTIONS: No  FALLS:  Has patient fallen in last 6 months? No  LIVING ENVIRONMENT: Lives with: lives alone Lives in: House/apartment Stairs: Yes: Internal: 15 steps; on left going up Has following equipment at home: None  OCCUPATION: Cleaning/Housekeeping  PLOF: Independent and Independent with basic ADLs  PATIENT GOALS: Patient would like to return to normal daily activities, esp cleaning tasks without exacerbation of symptoms.   NEXT MD VISIT: not scheduled at time of eval    OBJECTIVE:   DIAGNOSTIC FINDINGS:  07/20/22 MRI Lumbar Spine WO Contrast   T/L spine showing congenitally short pedicales, mild bilateral neural foraminal narrowing at L1-2, L2-3, L3-4, and L4-5.  L foraminal disc protrusion at L3-4 may contact exiting L L3 nerve.  Mild bilateral foraminal narrowing at t9-10, T10-11, and T11-12.  No significant spinal canal stenosis in thoracic or lumbar spine.  PATIENT SURVEYS:  FOTO 10 current, 39 predicted    COGNITION: Overall cognitive status: Within functional limits for tasks  assessed    SENSATION: Not tested  MUSCLE LENGTH: Thomas test: not tested   POSTURE: rounded shoulders, increased lumbar lordosis, anterior pelvic tilt, and flexed trunk  noted in seated and standing   PALPATION: Not tested.   LUMBAR ROM:   AROM eval  Flexion 65%  Extension 10%, p!  Right lateral flexion 50% p!  Left lateral flexion 60%  Right rotation 30%  Left rotation 20%, p!   (Blank rows = not tested)   LOWER EXTREMITY MMT:    MMT Right eval Left eval L 8/6  Hip flexion 4+ 3+ 3+  Hip extension     Hip abduction 4+ 3+   Hip adduction     Hip internal rotation     Hip external rotation     Knee flexion     Knee extension 4 4-   Ankle dorsiflexion 5 3+ 4-  Ankle plantarflexion     Ankle inversion     Ankle eversion      (Blank rows = not tested)  LUMBAR SPECIAL TESTS:  Not tested   FUNCTIONAL TESTS:  Not tested   GAIT: Distance walked: 50 ft Assistive device utilized: None Level of assistance: Complete Independence Comments: forward flexed posture noted and slow gait speed consistent with pain severity   TODAY'S TREATMENT:         [facility] Adult PT Treatment:                                                DATE: 08/23/22 Therapeutic Exercise: Seated ball roll out C/L/R - 10x ea LTR - 20x Hip flexion - Blue TB in HL - 2x10 PPT - 2x10 Alternating clamshell 2x10 BIL Toe raises at wall - 2x10 Modalities: MHP during supine exercises   [facility] Adult PT Treatment:  DATE: 08/16/22 Therapeutic Exercise: Seated ball roll out C/L/R - 10x ea LTR - 20x Hip flexion - Blue TB in HL - 2x10 S/L R QL stretch - not tolerated d/t pain PPT - 2x10 Alternating clam - blue TB - 2x10 ea Toe raises at wall - 2x10                                                                                                                        [facility] Adult PT Treatment:                                                DATE:  08/03/22  Therapeutic Exercise: Diaphragmatic breathing Seated repeated lumbar flexion/table slide  with 3 sec hold each x 10  Patient education and HEP review as noted below     PATIENT EDUCATION:  Education details: reviewed initial home exercise program; discussion of POC, prognosis and goals for skilled PT   Person educated: Patient Education method: Explanation, Demonstration, and Handouts Education comprehension: verbalized understanding, returned demonstration, and needs further education  HOME EXERCISE PROGRAM: Access Code: [email or web address] URL: [email or web address] Date: 08/04/2022 Prepared by: Mauri Reading  Exercises - Seated Diaphragmatic Breathing  - 2 x daily - 7 x weekly - 1 sets - 10 reps - Seated Flexion Stretch  - 2 x daily - 7 x weekly - 1 sets - 10 reps - 3 sec hold  ASSESSMENT:  CLINICAL IMPRESSION: Patient presents to PT session 14 minutes late, truncating session, reporting continued lower back pain but lessened radiation down LLE today. She remains limited by pain throughout session, moving with slow guarded movements. Utilized MHP during supine exercises for pain modulation. Patient continues to benefit from skilled PT services and should be progressed as able to improve functional independence.     OBJECTIVE IMPAIRMENTS: decreased activity tolerance, decreased endurance, decreased ROM, decreased strength, improper body mechanics, postural dysfunction, and pain.   ACTIVITY LIMITATIONS: carrying, lifting, bending, squatting, sleeping, stairs, dressing, locomotion level, and caring for others  PARTICIPATION LIMITATIONS: meal prep, cleaning, laundry, interpersonal relationship, driving, shopping, community activity, and occupation  PERSONAL FACTORS: Past/current experiences, Profession, Time since onset of injury/illness/exacerbation, and 1 comorbidity: PMHx includes HTN  are also affecting patient's functional outcome.   REHAB POTENTIAL: Fair     CLINICAL DECISION MAKING: Evolving/moderate complexity  EVALUATION COMPLEXITY: Moderate   GOALS: Goals reviewed with patient? Yes  SHORT TERM GOALS: Target date: 08/25/2022  Patient will be independent with initial home program for pain modulation, lumbar mobility and LE strengthening.  Baseline: initiated at eval  Goal status: INITIAL  2.  Patient will demonstrate improved postural awareness for at least 15 minutes while seated without need for cueing from PT.   Baseline: see objective measures  Goal status: INITIAL   LONG TERM GOALS: Target date: 09/14/2022   Patient will report improved  overall functional ability with FOTO score of 39 or greater.  Baseline: 10 Goal status: INITIAL  2.  Patient will demonstrate improved LS AROM by at least 20% full range in all directions.  Baseline: see objective measures  Goal status: INITIAL  3.  Patient will demonstrate at least 4+/5 LE MMT BIL.  Baseline: see objective measures Goal status: INITIAL  4.  Patient will demonstrate ability to push/pull at least 20# with proper body mechanics and with no more than minimal pain severity in order to return to PLOF.  Baseline: unable to perform without severe pain.  Goal status: INITIAL  5.  Patient will demonstrate ability to perform squatting with proper body mechanics for floor-to-counter lifting of at least 10#.  Baseline: increases pain severity  Goal status: INITIAL   PLAN:  PT FREQUENCY: 1-2x/week  PT DURATION: 6 weeks  PLANNED INTERVENTIONS: Therapeutic exercises, Therapeutic activity, Neuromuscular re-education, Patient/Family education, Self Care, Joint mobilization, Aquatic Therapy, Spinal mobilization, Cryotherapy, Moist heat, Taping, Manual therapy, and Re-evaluation.  PLAN FOR NEXT SESSION: continue with pain modulation activities including aerobic activity, repeated lumbar flexion, neutral core mm activation/stabilization activities, LE strengthening, manual therapy as  indicated, review HEP and progress as able    Check all possible CPT codes: 96045 - PT Re-evaluation, 97110- Therapeutic Exercise, [phone]- Neuro Re-education, 97140 - Manual Therapy, 97530 - Therapeutic Activities, [phone] - Self Care, and [phone] - Aquatic therapy    Check all conditions that are expected to impact treatment: {Conditions expected to impact treatment:None of these apply   If treatment provided at initial evaluation, no treatment charged due to lack of authorization.       Berta Minor, PTA 08/23/2022, 9:22 AM

## 2022-08-25 ENCOUNTER — Ambulatory Visit: Payer: Medicaid Other

## 2022-08-30 ENCOUNTER — Ambulatory Visit: Payer: Medicaid Other | Admitting: Physical Therapy

## 2022-08-30 ENCOUNTER — Ambulatory Visit: Payer: Medicaid Other

## 2022-08-30 ENCOUNTER — Telehealth: Payer: Self-pay

## 2022-08-30 NOTE — Telephone Encounter (Signed)
Spoke with patient regarding missed appointment, she stated she was unable to obtain transportation for today. Reminded of clinic attendance policy and confirmed next appointment.  1st no-show  Maria Logan, [place] 08/30/22 5:54 PM

## 2022-09-01 ENCOUNTER — Ambulatory Visit: Payer: Medicaid Other

## 2022-09-06 ENCOUNTER — Ambulatory Visit: Payer: Medicaid Other

## 2022-09-14 ENCOUNTER — Ambulatory Visit: Payer: Medicaid Other

## 2022-09-14 ENCOUNTER — Telehealth: Payer: Self-pay

## 2022-09-14 ENCOUNTER — Ambulatory Visit: Payer: Medicaid Other | Attending: Family Medicine

## 2022-09-14 NOTE — Telephone Encounter (Signed)
LVM regarding our cancellation policy. Pt will need new referral to resume PT. - MJ

## 2022-09-19 DIAGNOSIS — M79602 Pain in left arm: Secondary | ICD-10-CM | POA: Diagnosis not present

## 2022-09-19 DIAGNOSIS — G894 Chronic pain syndrome: Secondary | ICD-10-CM | POA: Diagnosis not present

## 2022-09-19 DIAGNOSIS — M79642 Pain in left hand: Secondary | ICD-10-CM | POA: Diagnosis not present

## 2022-09-19 DIAGNOSIS — M5412 Radiculopathy, cervical region: Secondary | ICD-10-CM | POA: Diagnosis not present

## 2022-09-19 DIAGNOSIS — Z79891 Long term (current) use of opiate analgesic: Secondary | ICD-10-CM | POA: Diagnosis not present

## 2022-09-19 DIAGNOSIS — M542 Cervicalgia: Secondary | ICD-10-CM | POA: Diagnosis not present

## 2022-10-04 DIAGNOSIS — N3011 Interstitial cystitis (chronic) with hematuria: Secondary | ICD-10-CM | POA: Diagnosis not present

## 2022-10-04 DIAGNOSIS — M179 Osteoarthritis of knee, unspecified: Secondary | ICD-10-CM | POA: Diagnosis not present

## 2022-10-04 DIAGNOSIS — M5442 Lumbago with sciatica, left side: Secondary | ICD-10-CM | POA: Diagnosis not present

## 2022-10-04 DIAGNOSIS — G894 Chronic pain syndrome: Secondary | ICD-10-CM | POA: Diagnosis not present

## 2022-10-17 DIAGNOSIS — M5412 Radiculopathy, cervical region: Secondary | ICD-10-CM | POA: Diagnosis not present

## 2022-10-17 DIAGNOSIS — G894 Chronic pain syndrome: Secondary | ICD-10-CM | POA: Diagnosis not present

## 2022-10-17 DIAGNOSIS — Z79891 Long term (current) use of opiate analgesic: Secondary | ICD-10-CM | POA: Diagnosis not present

## 2022-10-17 DIAGNOSIS — M542 Cervicalgia: Secondary | ICD-10-CM | POA: Diagnosis not present

## 2022-10-17 DIAGNOSIS — M79602 Pain in left arm: Secondary | ICD-10-CM | POA: Diagnosis not present

## 2022-10-17 DIAGNOSIS — M79642 Pain in left hand: Secondary | ICD-10-CM | POA: Diagnosis not present

## 2022-10-21 DIAGNOSIS — R7309 Other abnormal glucose: Secondary | ICD-10-CM | POA: Diagnosis not present

## 2022-10-21 DIAGNOSIS — E611 Iron deficiency: Secondary | ICD-10-CM | POA: Diagnosis not present

## 2022-10-21 DIAGNOSIS — R5383 Other fatigue: Secondary | ICD-10-CM | POA: Diagnosis not present

## 2022-10-21 DIAGNOSIS — D519 Vitamin B12 deficiency anemia, unspecified: Secondary | ICD-10-CM | POA: Diagnosis not present

## 2022-10-21 DIAGNOSIS — I10 Essential (primary) hypertension: Secondary | ICD-10-CM | POA: Diagnosis not present

## 2022-10-21 DIAGNOSIS — E559 Vitamin D deficiency, unspecified: Secondary | ICD-10-CM | POA: Diagnosis not present

## 2022-10-21 DIAGNOSIS — N951 Menopausal and female climacteric states: Secondary | ICD-10-CM | POA: Diagnosis not present

## 2022-11-14 DIAGNOSIS — G894 Chronic pain syndrome: Secondary | ICD-10-CM | POA: Diagnosis not present

## 2022-11-14 DIAGNOSIS — Z79891 Long term (current) use of opiate analgesic: Secondary | ICD-10-CM | POA: Diagnosis not present

## 2022-11-14 DIAGNOSIS — M79642 Pain in left hand: Secondary | ICD-10-CM | POA: Diagnosis not present

## 2022-11-14 DIAGNOSIS — M5412 Radiculopathy, cervical region: Secondary | ICD-10-CM | POA: Diagnosis not present

## 2022-11-14 DIAGNOSIS — R52 Pain, unspecified: Secondary | ICD-10-CM | POA: Diagnosis not present

## 2022-11-14 DIAGNOSIS — M542 Cervicalgia: Secondary | ICD-10-CM | POA: Diagnosis not present

## 2022-12-12 DIAGNOSIS — G894 Chronic pain syndrome: Secondary | ICD-10-CM | POA: Diagnosis not present

## 2022-12-12 DIAGNOSIS — M79642 Pain in left hand: Secondary | ICD-10-CM | POA: Diagnosis not present

## 2022-12-12 DIAGNOSIS — M542 Cervicalgia: Secondary | ICD-10-CM | POA: Diagnosis not present

## 2022-12-12 DIAGNOSIS — M79602 Pain in left arm: Secondary | ICD-10-CM | POA: Diagnosis not present

## 2022-12-12 DIAGNOSIS — R52 Pain, unspecified: Secondary | ICD-10-CM | POA: Diagnosis not present

## 2022-12-12 DIAGNOSIS — M5412 Radiculopathy, cervical region: Secondary | ICD-10-CM | POA: Diagnosis not present

## 2022-12-12 DIAGNOSIS — Z79891 Long term (current) use of opiate analgesic: Secondary | ICD-10-CM | POA: Diagnosis not present

## 2022-12-21 ENCOUNTER — Emergency Department (HOSPITAL_BASED_OUTPATIENT_CLINIC_OR_DEPARTMENT_OTHER)
Admission: EM | Admit: 2022-12-21 | Discharge: 2022-12-21 | Disposition: A | Discharge status: Home / Self Care | Payer: Medicaid Other | Attending: Emergency Medicine | Admitting: Emergency Medicine

## 2022-12-21 ENCOUNTER — Encounter (HOSPITAL_BASED_OUTPATIENT_CLINIC_OR_DEPARTMENT_OTHER): Payer: Self-pay | Admitting: Emergency Medicine

## 2022-12-21 ENCOUNTER — Other Ambulatory Visit: Payer: Self-pay

## 2022-12-21 DIAGNOSIS — N390 Urinary tract infection, site not specified: Secondary | ICD-10-CM | POA: Insufficient documentation

## 2022-12-21 DIAGNOSIS — R319 Hematuria, unspecified: Secondary | ICD-10-CM | POA: Diagnosis not present

## 2022-12-21 DIAGNOSIS — R350 Frequency of micturition: Secondary | ICD-10-CM | POA: Diagnosis present

## 2022-12-21 LAB — URINALYSIS, ROUTINE W REFLEX MICROSCOPIC
Bacteria, UA: NONE SEEN
Bilirubin Urine: NEGATIVE
Glucose, UA: NEGATIVE mg/dL
Ketones, ur: NEGATIVE mg/dL
Nitrite: NEGATIVE
Protein, ur: NEGATIVE mg/dL
Specific Gravity, Urine: 1.019 (ref 1.005–1.030)
WBC, UA: >50 WBC/hpf (ref 0–5)
pH: 6 (ref 5.0–8.0)

## 2022-12-21 MED ORDER — FLUCONAZOLE 150 MG PO TABS: 150.0000 mg | ORAL_TABLET | Freq: Every day | ORAL | 0 refills | Status: AC

## 2022-12-21 MED ORDER — CEFPODOXIME PROXETIL 200 MG PO TABS: 200.0000 mg | ORAL_TABLET | Freq: Two times a day (BID) | ORAL | 0 refills | Status: DC

## 2022-12-21 NOTE — ED Triage Notes (Signed)
Urinary frequency x 1 week. Denies dysuria.

## 2022-12-21 NOTE — ED Provider Notes (Signed)
[place] EMERGENCY DEPARTMENT AT [facility] Provider Note   CSN: [id number] Arrival date & time: 12/21/22  1644     History  Chief Complaint  Patient presents with   Urinary Frequency    Maria Logan is a 57 y.o. female past medical history significant for frequent UTIs and nephritis presents today for urinary frequency x 1 week.  Patient denies hematuria, fever, nausea, vomiting, or dysuria.  Patient does endorse mild right flank pain and suprapubic tenderness.   Urinary Frequency Associated symptoms include abdominal pain.       Home Medications Prior to Admission medications   Medication Sig Start Date End Date Taking? Authorizing Provider  cefpodoxime (VANTIN) 200 MG tablet Take 1 tablet (200 mg total) by mouth 2 (two) times daily. 12/21/22  Yes Dolphus Jenny, PA-C  fluconazole (DIFLUCAN) 150 MG tablet Take 1 tablet (150 mg total) by mouth daily. 12/21/22  Yes Dolphus Jenny, PA-C  APPLE CIDER VINEGAR PO Take 1 capsule by mouth daily as needed (for gut health).    [provider]  B Complex Vitamins (VITAMIN B COMPLEX) TABS Take 1 tablet by mouth daily.    [provider]  benzonatate (TESSALON) 100 MG capsule Take 1-2 capsules (100-200 mg total) by mouth every 8 (eight) hours as needed for cough. 12/17/19   Georgetta Haber, NP  cetirizine (ZYRTEC) 10 MG tablet Take 1 tablet (10 mg total) by mouth daily. Patient not taking: Reported on 08/03/2022 12/17/19   Linus Mako B, NP  gabapentin (NEURONTIN) 100 MG capsule Take 1 capsule (100 mg total) by mouth 3 (three) times daily. 06/02/20   Arthor Captain, PA-C  HYDROcodone-acetaminophen (NORCO/VICODIN) 5-325 MG tablet Take 2 tablets by mouth every 4 (four) hours as needed. 05/27/20   Wynetta Fines, MD  sertraline (ZOLOFT) 50 MG tablet Take 50 mg by mouth daily.    [provider]      Allergies    Patient has no known allergies.    Review of Systems   Review of Systems   Gastrointestinal:  Positive for abdominal pain.  Genitourinary:  Positive for flank pain and frequency.    Physical Exam Updated Vital Signs BP 134/86   Pulse 71   Temp 98.2 F (36.8 C)   Resp 18   Ht 5\' 6"  (1.676 m)   Wt 90.7 kg   LMP 12/09/2014 (Approximate)   SpO2 99%   BMI 32.28 kg/m  Physical Exam Vitals and nursing note reviewed.  Constitutional:      General: She is not in acute distress.    Appearance: She is well-developed.  HENT:     Head: Normocephalic and atraumatic.  Eyes:     Conjunctiva/sclera: Conjunctivae normal.  Cardiovascular:     Rate and Rhythm: Normal rate and regular rhythm.     Heart sounds: No murmur heard. Pulmonary:     Effort: Pulmonary effort is normal. No respiratory distress.     Breath sounds: Normal breath sounds.  Abdominal:     General: Bowel sounds are normal.     Palpations: Abdomen is soft.     Tenderness: There is abdominal tenderness in the suprapubic area. There is right CVA tenderness. There is no left CVA tenderness.  Musculoskeletal:        General: No swelling.     Cervical back: Normal range of motion and neck supple.  Skin:    General: Skin is warm and dry.     Capillary Refill: Capillary  refill takes less than 2 seconds.  Neurological:     General: No focal deficit present.     Mental Status: She is alert.     Motor: No weakness.  Psychiatric:        Mood and Affect: Mood normal.     ED Results / Procedures / Treatments   Labs (all labs ordered are listed, but only abnormal results are displayed) Labs Reviewed  URINALYSIS, ROUTINE W REFLEX MICROSCOPIC - Abnormal; Notable for the following components:      Result Value   Hgb urine dipstick MODERATE (*)    Leukocytes,Ua MODERATE (*)    All other components within normal limits    EKG None  Radiology No results found.  Procedures Procedures    Medications Ordered in ED Medications - No data to display  ED Course/ Medical Decision Making/ A&P                                  Medical Decision Making Amount and/or Complexity of Data Reviewed Labs: ordered.  Risk Prescription drug management.   This patient presents to the ED with chief complaint(s) of urinary frequency with pertinent past medical history of UTIs which further complicates the presenting complaint. The complaint involves an extensive differential diagnosis and also carries with it a high risk of complications and morbidity.    The differential diagnosis includes urinary tract infection, pyelonephritis  Additional history obtained: Records reviewed Primary Care Documents  ED Course and Reassessment:   Independent labs interpretation:  The following labs were independently interpreted:  UA: Moderate hemoglobin, moderate leukocytes, 21-50 RBCs, greater than 50 WBCs  Consultation: - Consulted or discussed management/test interpretation w/ external professional: None  Consideration for admission or further workup: Considered for admission further workup however patient's vital signs, physical exam, and labs have all been reassuring.  You shared decision making with patient for possibility of pyelonephritis with inpatient versus outpatient treatment.  Patient opted for outpatient treatment and given strict return precautions.        Final Clinical Impression(s) / ED Diagnoses Final diagnoses:  Urinary tract infection with hematuria, site unspecified    Rx / DC Orders ED Discharge Orders          Ordered    cefpodoxime (VANTIN) 200 MG tablet  2 times daily        12/21/22 1914    fluconazole (DIFLUCAN) 150 MG tablet  Daily        12/21/22 1928              Dolphus Jenny, PA-C 12/21/22 1929    Rondel Baton, MD 12/23/22 1141

## 2022-12-21 NOTE — ED Notes (Signed)
Pt alert and oriented X 4 at the time of discharge. RR even and unlabored. No acute distress noted. Pt verbalized understanding of discharge instructions as discussed. Pt ambulatory to lobby at time of discharge.

## 2022-12-21 NOTE — Discharge Instructions (Signed)
Today you were seen for urinary tract infection please pick up your antibiotic and take as prescribed.  Please return if you have worsening flank pain, fever, or worsening of symptoms.  Thank you for letting us treat you today. After performing a physical exam and reviewing your labs, I feel you are safe to go home. Please follow up with your PCP in the next several days and provide them with your records from this visit. Return to the Emergency Room if pain becomes severe or symptoms worsen.

## 2022-12-27 DIAGNOSIS — N3011 Interstitial cystitis (chronic) with hematuria: Secondary | ICD-10-CM | POA: Diagnosis not present

## 2022-12-27 DIAGNOSIS — G894 Chronic pain syndrome: Secondary | ICD-10-CM | POA: Diagnosis not present

## 2022-12-27 DIAGNOSIS — N951 Menopausal and female climacteric states: Secondary | ICD-10-CM | POA: Diagnosis not present

## 2023-01-19 DIAGNOSIS — M542 Cervicalgia: Secondary | ICD-10-CM | POA: Diagnosis not present

## 2023-01-19 DIAGNOSIS — R52 Pain, unspecified: Secondary | ICD-10-CM | POA: Diagnosis not present

## 2023-01-19 DIAGNOSIS — Z79891 Long term (current) use of opiate analgesic: Secondary | ICD-10-CM | POA: Diagnosis not present

## 2023-01-19 DIAGNOSIS — M79642 Pain in left hand: Secondary | ICD-10-CM | POA: Diagnosis not present

## 2023-01-19 DIAGNOSIS — M79602 Pain in left arm: Secondary | ICD-10-CM | POA: Diagnosis not present

## 2023-01-19 DIAGNOSIS — M5412 Radiculopathy, cervical region: Secondary | ICD-10-CM | POA: Diagnosis not present

## 2023-01-19 DIAGNOSIS — G894 Chronic pain syndrome: Secondary | ICD-10-CM | POA: Diagnosis not present

## 2023-02-23 DIAGNOSIS — Z79891 Long term (current) use of opiate analgesic: Secondary | ICD-10-CM | POA: Diagnosis not present

## 2023-02-23 DIAGNOSIS — D519 Vitamin B12 deficiency anemia, unspecified: Secondary | ICD-10-CM | POA: Diagnosis not present

## 2023-02-23 DIAGNOSIS — G894 Chronic pain syndrome: Secondary | ICD-10-CM | POA: Diagnosis not present

## 2023-02-23 DIAGNOSIS — R5383 Other fatigue: Secondary | ICD-10-CM | POA: Diagnosis not present

## 2023-02-23 DIAGNOSIS — N951 Menopausal and female climacteric states: Secondary | ICD-10-CM | POA: Diagnosis not present

## 2023-02-23 DIAGNOSIS — M79602 Pain in left arm: Secondary | ICD-10-CM | POA: Diagnosis not present

## 2023-02-23 DIAGNOSIS — M79642 Pain in left hand: Secondary | ICD-10-CM | POA: Diagnosis not present

## 2023-02-23 DIAGNOSIS — M542 Cervicalgia: Secondary | ICD-10-CM | POA: Diagnosis not present

## 2023-02-23 DIAGNOSIS — E559 Vitamin D deficiency, unspecified: Secondary | ICD-10-CM | POA: Diagnosis not present

## 2023-06-08 DIAGNOSIS — E559 Vitamin D deficiency, unspecified: Secondary | ICD-10-CM | POA: Diagnosis not present

## 2023-06-08 DIAGNOSIS — N3011 Interstitial cystitis (chronic) with hematuria: Secondary | ICD-10-CM | POA: Diagnosis not present

## 2023-06-08 DIAGNOSIS — F430 Acute stress reaction: Secondary | ICD-10-CM | POA: Diagnosis not present

## 2023-06-14 DIAGNOSIS — Z13220 Encounter for screening for lipoid disorders: Secondary | ICD-10-CM | POA: Diagnosis not present

## 2023-06-14 DIAGNOSIS — F430 Acute stress reaction: Secondary | ICD-10-CM | POA: Diagnosis not present

## 2023-06-14 DIAGNOSIS — Z1239 Encounter for other screening for malignant neoplasm of breast: Secondary | ICD-10-CM | POA: Diagnosis not present

## 2023-06-14 DIAGNOSIS — E559 Vitamin D deficiency, unspecified: Secondary | ICD-10-CM | POA: Diagnosis not present

## 2023-06-14 DIAGNOSIS — Z0000 Encounter for general adult medical examination without abnormal findings: Secondary | ICD-10-CM | POA: Diagnosis not present

## 2023-06-14 DIAGNOSIS — Z131 Encounter for screening for diabetes mellitus: Secondary | ICD-10-CM | POA: Diagnosis not present

## 2023-06-14 DIAGNOSIS — N3011 Interstitial cystitis (chronic) with hematuria: Secondary | ICD-10-CM | POA: Diagnosis not present

## 2023-06-14 DIAGNOSIS — E669 Obesity, unspecified: Secondary | ICD-10-CM | POA: Diagnosis not present

## 2023-07-26 DIAGNOSIS — F3489 Other specified persistent mood disorders: Secondary | ICD-10-CM | POA: Diagnosis not present

## 2023-09-13 ENCOUNTER — Emergency Department (HOSPITAL_BASED_OUTPATIENT_CLINIC_OR_DEPARTMENT_OTHER)
Admission: EM | Admit: 2023-09-13 | Discharge: 2023-09-13 | Disposition: A | Discharge status: Home / Self Care | Attending: Emergency Medicine | Admitting: Emergency Medicine

## 2023-09-13 ENCOUNTER — Other Ambulatory Visit: Payer: Self-pay

## 2023-09-13 DIAGNOSIS — J029 Acute pharyngitis, unspecified: Secondary | ICD-10-CM | POA: Diagnosis not present

## 2023-09-13 LAB — GROUP A STREP BY PCR: Group A Strep by PCR: NOT DETECTED

## 2023-09-13 MED ORDER — AMOXICILLIN-POT CLAVULANATE 875-125 MG PO TABS: 1.0000 | ORAL_TABLET | Freq: Two times a day (BID) | ORAL | 0 refills | Status: DC

## 2023-09-13 MED ORDER — DEXAMETHASONE 4 MG PO TABS
10.0000 mg | ORAL_TABLET | Freq: Once | ORAL | Status: AC
  Administered 2023-09-13: 10 mg via ORAL
  Filled 2023-09-13: qty 3

## 2023-09-13 NOTE — ED Provider Notes (Signed)
 [place] EMERGENCY DEPARTMENT AT [facility] Provider Note   CSN: [id number] Arrival date & time: 09/13/23  8365     Patient presents with: Sore Throat   Maria Logan is a 58 y.o. female.   58 yo F with a cc of sore throat.  This been going on for about 5 or 6 days.  Right-sided feels like she has some right ear pain as well.  Pain with swallowing.  Coughing.  No known sick contacts.   Sore Throat       Prior to Admission medications   Medication Sig Start Date End Date Taking? Authorizing Provider  amoxicillin -clavulanate (AUGMENTIN ) 875-125 MG tablet Take 1 tablet by mouth every 12 (twelve) hours. 09/13/23  Yes Emil Share, DO  APPLE CIDER VINEGAR PO Take 1 capsule by mouth daily as needed (for gut health).    [provider]  B Complex Vitamins (VITAMIN B COMPLEX) TABS Take 1 tablet by mouth daily.    [provider]  benzonatate  (TESSALON ) 100 MG capsule Take 1-2 capsules (100-200 mg total) by mouth every 8 (eight) hours as needed for cough. 12/17/19   Burky, Natalie B, NP  cefpodoxime  (VANTIN ) 200 MG tablet Take 1 tablet (200 mg total) by mouth 2 (two) times daily. 12/21/22   Keith, Kayla N, PA-C  cetirizine  (ZYRTEC ) 10 MG tablet Take 1 tablet (10 mg total) by mouth daily. Patient not taking: Reported on 08/03/2022 12/17/19   Burky, Natalie B, NP  fluconazole  (DIFLUCAN ) 150 MG tablet Take 1 tablet (150 mg total) by mouth daily. 12/21/22   Keith, Kayla N, PA-C  gabapentin  (NEURONTIN ) 100 MG capsule Take 1 capsule (100 mg total) by mouth 3 (three) times daily. 06/02/20   Harris, Abigail, PA-C  HYDROcodone -acetaminophen  (NORCO/VICODIN) 5-325 MG tablet Take 2 tablets by mouth every 4 (four) hours as needed. 05/27/20   Laurice Maude BROCKS, MD  sertraline  (ZOLOFT ) 50 MG tablet Take 50 mg by mouth daily.    [provider]    Allergies: Patient has no known allergies.    Review of Systems  Updated Vital Signs BP 124/75 (BP Location: Right Arm)    Pulse 67   Temp 97.9 F (36.6 C)   Resp 20   LMP 12/09/2014 (Approximate)   SpO2 95%   Physical Exam Vitals and nursing note reviewed.  Constitutional:      General: She is not in acute distress.    Appearance: She is well-developed. She is not diaphoretic.  HENT:     Head: Normocephalic and atraumatic.     Mouth/Throat:     Comments: Four finger trismus.  No obvious tonsillar swelling or exudates.  Able to tolerate secretions without difficulty able to rotate her head without pain.  Right TM with a serous effusion left TM normal Eyes:     Pupils: Pupils are equal, round, and reactive to light.  Cardiovascular:     Rate and Rhythm: Normal rate and regular rhythm.     Heart sounds: No murmur heard.    No friction rub. No gallop.  Pulmonary:     Effort: Pulmonary effort is normal.     Breath sounds: No wheezing or rales.  Abdominal:     General: There is no distension.     Palpations: Abdomen is soft.     Tenderness: There is no abdominal tenderness.  Musculoskeletal:        General: No tenderness.     Cervical back: Normal range of motion and neck supple.  Skin:    General: Skin is warm and dry.  Neurological:     Mental Status: She is alert and oriented to person, place, and time.  Psychiatric:        Behavior: Behavior normal.     (all labs ordered are listed, but only abnormal results are displayed) Labs Reviewed  GROUP A STREP BY PCR    EKG: None  Radiology: No results found.   Procedures   Medications Ordered in the ED  dexamethasone  (DECADRON ) tablet 10 mg (10 mg Oral Given 09/13/23 1934)                                    Medical Decision Making Risk Prescription drug management.   89 yoF with a chief complaints of sore throat.  Right sided going on for about 5 or 6 days now.  She describes significant difficulty swallowing and also feels like she has trouble opening her mouth.  No obvious significant finding clinically.  She does have some  right sided cervical lymphadenopathy no sublingual swelling tolerating secretions without difficulty right TM with an effusion.  I will start on oral antibiotics.  Dose of steroids here.  PCP follow-up.  8:03 PM:  I have discussed the diagnosis/risks/treatment options with the patient and family.  Evaluation and diagnostic testing in the emergency department does not suggest an emergent condition requiring admission or immediate intervention beyond what has been performed at this time.  They will follow up with PCP. We also discussed returning to the ED immediately if new or worsening sx occur. We discussed the sx which are most concerning (e.g., sudden worsening pain, fever, inability to tolerate by mouth) that necessitate immediate return. Medications administered to the patient during their visit and any new prescriptions provided to the patient are listed below.  Medications given during this visit Medications  dexamethasone  (DECADRON ) tablet 10 mg (10 mg Oral Given 09/13/23 1934)     The patient appears reasonably screen and/or stabilized for discharge and I doubt any other medical condition or other [facility] requiring further screening, evaluation, or treatment in the ED at this time prior to discharge.       Final diagnoses:  Pharyngitis, unspecified etiology    ED Discharge Orders          Ordered    amoxicillin -clavulanate (AUGMENTIN ) 875-125 MG tablet  Every 12 hours        09/13/23 1926               Emil Share, DO 09/13/23 2003

## 2023-09-13 NOTE — ED Notes (Signed)
 Pt d/c instructions, medications, and follow-up care reviewed with pt. Pt verbalized understanding and had no further questions at time of d/c. Pt CA&Ox4, ambulatory, and in NAD at time of d/c

## 2023-09-13 NOTE — ED Triage Notes (Signed)
 Patient states throat is painful when swallowing. States feels like there is a knot in her throat.

## 2023-09-13 NOTE — Discharge Instructions (Signed)
 Take tylenol  2 pills 4 times a day and motrin  4 pills 3 times a day.  Drink plenty of fluids.  Return for worsening shortness of breath, headache, confusion. Follow up with your family doctor.    Please return for persistent fever difficulty turning your neck inability to swallow.

## 2023-09-23 ENCOUNTER — Emergency Department (HOSPITAL_BASED_OUTPATIENT_CLINIC_OR_DEPARTMENT_OTHER)

## 2023-09-23 ENCOUNTER — Encounter (HOSPITAL_BASED_OUTPATIENT_CLINIC_OR_DEPARTMENT_OTHER): Payer: Self-pay | Admitting: Emergency Medicine

## 2023-09-23 ENCOUNTER — Emergency Department (HOSPITAL_BASED_OUTPATIENT_CLINIC_OR_DEPARTMENT_OTHER)
Admission: EM | Admit: 2023-09-23 | Discharge: 2023-09-23 | Disposition: A | Discharge status: Home / Self Care | Attending: Emergency Medicine | Admitting: Emergency Medicine

## 2023-09-23 ENCOUNTER — Other Ambulatory Visit: Payer: Self-pay

## 2023-09-23 DIAGNOSIS — R197 Diarrhea, unspecified: Secondary | ICD-10-CM | POA: Diagnosis not present

## 2023-09-23 DIAGNOSIS — H9201 Otalgia, right ear: Secondary | ICD-10-CM | POA: Diagnosis not present

## 2023-09-23 DIAGNOSIS — R5383 Other fatigue: Secondary | ICD-10-CM | POA: Insufficient documentation

## 2023-09-23 DIAGNOSIS — J029 Acute pharyngitis, unspecified: Secondary | ICD-10-CM | POA: Insufficient documentation

## 2023-09-23 DIAGNOSIS — M542 Cervicalgia: Secondary | ICD-10-CM | POA: Insufficient documentation

## 2023-09-23 DIAGNOSIS — M50321 Other cervical disc degeneration at C4-C5 level: Secondary | ICD-10-CM | POA: Diagnosis not present

## 2023-09-23 DIAGNOSIS — M799 Soft tissue disorder, unspecified: Secondary | ICD-10-CM | POA: Diagnosis not present

## 2023-09-23 LAB — URINALYSIS, ROUTINE W REFLEX MICROSCOPIC
Bilirubin Urine: NEGATIVE
Glucose, UA: NEGATIVE mg/dL
Ketones, ur: NEGATIVE mg/dL
Leukocytes,Ua: NEGATIVE
Nitrite: NEGATIVE
Protein, ur: NEGATIVE mg/dL
Specific Gravity, Urine: 1.009 (ref 1.005–1.030)
pH: 7.5 (ref 5.0–8.0)

## 2023-09-23 LAB — COMPREHENSIVE METABOLIC PANEL WITH GFR
ALT: 18 U/L (ref 0–44)
AST: 27 U/L (ref 15–41)
Albumin: 3.8 g/dL (ref 3.5–5.0)
Alkaline Phosphatase: 69 U/L (ref 38–126)
Anion gap: 9 (ref 5–15)
BUN: 12 mg/dL (ref 6–20)
CO2: 25 mmol/L (ref 22–32)
Calcium: 9.2 mg/dL (ref 8.9–10.3)
Chloride: 106 mmol/L (ref 98–111)
Creatinine, Ser: 0.99 mg/dL (ref 0.44–1.00)
GFR, Estimated: >60 mL/min (ref >60)
Glucose, Bld: 87 mg/dL (ref 70–99)
Potassium: 4.3 mmol/L (ref 3.5–5.1)
Sodium: 140 mmol/L (ref 135–145)
Total Bilirubin: 0.4 mg/dL (ref 0.0–1.2)
Total Protein: 7 g/dL (ref 6.5–8.1)

## 2023-09-23 LAB — CBC WITH DIFFERENTIAL/PLATELET
Abs Immature Granulocytes: 0.01 K/uL (ref 0.00–0.07)
Basophils Absolute: 0 K/uL (ref 0.0–0.1)
Basophils Relative: 1 %
Eosinophils Absolute: 0.2 K/uL (ref 0.0–0.5)
Eosinophils Relative: 4 %
HCT: 39.1 % (ref 36.0–46.0)
Hemoglobin: 12.5 g/dL (ref 12.0–15.0)
Immature Granulocytes: 0 %
Lymphocytes Relative: 28 %
Lymphs Abs: 1.3 K/uL (ref 0.7–4.0)
MCH: 29.3 pg (ref 26.0–34.0)
MCHC: 32 g/dL (ref 30.0–36.0)
MCV: 91.8 fL (ref 80.0–100.0)
Monocytes Absolute: 0.5 K/uL (ref 0.1–1.0)
Monocytes Relative: 11 %
Neutro Abs: 2.8 K/uL (ref 1.7–7.7)
Neutrophils Relative %: 56 %
Platelets: 216 K/uL (ref 150–400)
RBC: 4.26 MIL/uL (ref 3.87–5.11)
RDW: 12.3 % (ref 11.5–15.5)
WBC: 4.8 K/uL (ref 4.0–10.5)
nRBC: 0 % (ref 0.0–0.2)

## 2023-09-23 LAB — RESP PANEL BY RT-PCR (RSV, FLU A&B, COVID)  RVPGX2
Influenza A by PCR: NEGATIVE
Influenza B by PCR: NEGATIVE
Resp Syncytial Virus by PCR: NEGATIVE
SARS Coronavirus 2 by RT PCR: NEGATIVE

## 2023-09-23 LAB — MONONUCLEOSIS SCREEN: Mono Screen: NEGATIVE

## 2023-09-23 MED ORDER — IOHEXOL 300 MG/ML  SOLN
75.0000 mL | Freq: Once | INTRAMUSCULAR | Status: AC | PRN
  Administered 2023-09-23: 75 mL via INTRAVENOUS

## 2023-09-23 MED ORDER — PENICILLIN V POTASSIUM 500 MG PO TABS: 500.0000 mg | ORAL_TABLET | Freq: Two times a day (BID) | ORAL | 0 refills | Status: AC

## 2023-09-23 NOTE — Discharge Instructions (Addendum)
 You have been seen and discharged from the emergency department.  Your blood work and urine were normal.  The CT scan showed an area of infection of the throat (pharyngitis) with a reactive lymph node.  Take new antibiotic as directed.  Use over-the-counter medications as needed.  Schedule follow-up with an ear nose and throat doctor for reevaluation.  Follow-up with your primary provider for further evaluation and further care. Take home medications as prescribed. If you have any worsening symptoms or further concerns for your health please return to an emergency department for further evaluation.

## 2023-09-23 NOTE — ED Triage Notes (Signed)
  Patient comes in with sore throat and R ear pain that has been going on for a couple days.  Patient states she was seen on 9/3 and diagnosed with strep.  Patient states she was given amoxicillin  and just finished it.  Patient states symptoms have gotten worse and she is having diarrhea multiple times per day.  Pain 7/10, sore/pressure.

## 2023-09-23 NOTE — ED Notes (Signed)
 Patient transported to CT

## 2023-09-23 NOTE — ED Provider Notes (Signed)
 [place] EMERGENCY DEPARTMENT AT [facility] Provider Note   CSN: [id number] Arrival date & time: 09/23/23  9356     Patient presents with: Sore Throat and Otalgia   Maria Logan is a 58 y.o. female.   HPI   58 year old female presents emergency department with right sided neck pain and ear discomfort.  This has been ongoing for about 2 weeks.  Was initially seen on September 3, thought to have strep throat and placed on Augmentin .  No significant change after completing that regimen.  She comes in with continued discomfort underneath the right mandible and side of the neck.  She has pain with full mouth opening, difficulty with chewing, painful swallowing.  This is altered the type of consistency of food that she is eating.  Denies any fever at this time.  Did have some mild diarrhea and hot flashes while taking Augmentin  but this resolved.  Also complaining of extreme fatigue, increased sleeping.  Denies any cough, vomiting, GU complaints.  Prior to Admission medications   Medication Sig Start Date End Date Taking? Authorizing Provider  amoxicillin -clavulanate (AUGMENTIN ) 875-125 MG tablet Take 1 tablet by mouth every 12 (twelve) hours. 09/13/23   Emil Share, DO  APPLE CIDER VINEGAR PO Take 1 capsule by mouth daily as needed (for gut health).    [provider]  B Complex Vitamins (VITAMIN B COMPLEX) TABS Take 1 tablet by mouth daily.    [provider]  benzonatate  (TESSALON ) 100 MG capsule Take 1-2 capsules (100-200 mg total) by mouth every 8 (eight) hours as needed for cough. 12/17/19   Burky, Natalie B, NP  cefpodoxime  (VANTIN ) 200 MG tablet Take 1 tablet (200 mg total) by mouth 2 (two) times daily. 12/21/22   Keith, Kayla N, PA-C  cetirizine  (ZYRTEC ) 10 MG tablet Take 1 tablet (10 mg total) by mouth daily. Patient not taking: Reported on 08/03/2022 12/17/19   Burky, Natalie B, NP  fluconazole  (DIFLUCAN ) 150 MG tablet Take 1 tablet (150 mg total) by mouth  daily. 12/21/22   Keith, Kayla N, PA-C  gabapentin  (NEURONTIN ) 100 MG capsule Take 1 capsule (100 mg total) by mouth 3 (three) times daily. 06/02/20   Harris, Abigail, PA-C  HYDROcodone -acetaminophen  (NORCO/VICODIN) 5-325 MG tablet Take 2 tablets by mouth every 4 (four) hours as needed. 05/27/20   Laurice Maude BROCKS, MD  sertraline  (ZOLOFT ) 50 MG tablet Take 50 mg by mouth daily.    [provider]    Allergies: Patient has no known allergies.    Review of Systems  Constitutional:  Positive for fatigue. Negative for fever.  HENT:  Positive for sore throat and trouble swallowing. Negative for dental problem, drooling, facial swelling and voice change.   Respiratory:  Negative for shortness of breath.   Cardiovascular:  Negative for chest pain.  Gastrointestinal:  Positive for diarrhea. Negative for abdominal pain and vomiting.  Skin:  Negative for rash.  Neurological:  Negative for headaches.    Updated Vital Signs BP 123/83 (BP Location: Right Arm)   Pulse 63   Temp 98.8 F (37.1 C) (Oral)   Resp 18   Ht 5' 6 (1.676 m)   Wt 90.7 kg   LMP 12/09/2014 (Approximate)   SpO2 100%   BMI 32.28 kg/m   Physical Exam Vitals and nursing note reviewed.  Constitutional:      General: She is not in acute distress.    Appearance: Normal appearance. She is not ill-appearing.  HENT:  Head: Normocephalic.     Right Ear: Tympanic membrane and ear canal normal. No drainage.     Left Ear: Tympanic membrane and ear canal normal. No drainage.     Mouth/Throat:     Mouth: Mucous membranes are moist. No oral lesions.     Pharynx: Uvula midline. No pharyngeal swelling, oropharyngeal exudate, posterior oropharyngeal erythema or uvula swelling.     Tonsils: No tonsillar exudate or tonsillar abscesses.  Neck:     Comments: Submandibular lymphadenopathy on the right extending to anterior neck.  No impressive focal swelling, or fluctuance Cardiovascular:     Rate and Rhythm: Normal rate.   Pulmonary:     Effort: Pulmonary effort is normal. No respiratory distress.  Abdominal:     Palpations: Abdomen is soft.     Tenderness: There is no abdominal tenderness.  Musculoskeletal:     Cervical back: Normal range of motion and neck supple.  Skin:    General: Skin is warm.  Neurological:     Mental Status: She is alert and oriented to person, place, and time. Mental status is at baseline.  Psychiatric:        Mood and Affect: Mood normal.     (all labs ordered are listed, but only abnormal results are displayed) Labs Reviewed  RESP PANEL BY RT-PCR (RSV, FLU A&B, COVID)  [id number]  CBC WITH DIFFERENTIAL/PLATELET  COMPREHENSIVE METABOLIC PANEL WITH GFR  MONONUCLEOSIS SCREEN  URINALYSIS, ROUTINE W REFLEX MICROSCOPIC    EKG: None  Radiology: No results found.   Procedures   Medications Ordered in the ED - No data to display                                  Medical Decision Making Amount and/or Complexity of Data Reviewed Labs: ordered. Radiology: ordered.  Risk Prescription drug management.   58 year old female presents emergency department with right sided sore throat.  This has been ongoing for the past 2 weeks.  Was originally treated for possible strep throat with Augmentin  without significant improvement.  Review of strep test a couple weeks ago was negative.  She now presents with ongoing right sided throat/neck pain with mild palpable lymphadenopathy.  Continues to have pain with full opening of her mouth but the oropharynx is relatively unremarkable.  However this is affecting the consistency of food that she can eat.  Respiratory panel was negative, given the duration of symptoms will expand workup.  Blood work is normal, Monospot is negative.  CT of the neck shows focal area of pharyngitis but no other acute/emergent findings.  This could still possibly be viral, but with the duration of symptoms we will treat with another round of antibiotics and refer  to ENT for more in-depth evaluation.  Patient at this time appears safe and stable for discharge and close outpatient follow up. Discharge plan and strict return to ED precautions discussed, patient verbalizes understanding and agreement.     Final diagnoses:  None    ED Discharge Orders     None          Bari Roxie HERO, DO 09/23/23 1125

## 2023-11-14 ENCOUNTER — Emergency Department (HOSPITAL_BASED_OUTPATIENT_CLINIC_OR_DEPARTMENT_OTHER)
Admission: EM | Admit: 2023-11-14 | Discharge: 2023-11-14 | Disposition: A | Discharge status: Home / Self Care | Source: Ambulatory Visit | Attending: Emergency Medicine | Admitting: Emergency Medicine

## 2023-11-14 ENCOUNTER — Emergency Department (HOSPITAL_BASED_OUTPATIENT_CLINIC_OR_DEPARTMENT_OTHER)

## 2023-11-14 ENCOUNTER — Other Ambulatory Visit: Payer: Self-pay

## 2023-11-14 DIAGNOSIS — N390 Urinary tract infection, site not specified: Secondary | ICD-10-CM | POA: Diagnosis not present

## 2023-11-14 DIAGNOSIS — N3289 Other specified disorders of bladder: Secondary | ICD-10-CM | POA: Diagnosis not present

## 2023-11-14 DIAGNOSIS — R109 Unspecified abdominal pain: Secondary | ICD-10-CM | POA: Diagnosis not present

## 2023-11-14 DIAGNOSIS — R350 Frequency of micturition: Secondary | ICD-10-CM | POA: Diagnosis present

## 2023-11-14 LAB — CBC
HCT: 37 % (ref 36.0–46.0)
Hemoglobin: 12.1 g/dL (ref 12.0–15.0)
MCH: 30.3 pg (ref 26.0–34.0)
MCHC: 32.7 g/dL (ref 30.0–36.0)
MCV: 92.5 fL (ref 80.0–100.0)
Platelets: 215 K/uL (ref 150–400)
RBC: 4 MIL/uL (ref 3.87–5.11)
RDW: 12 % (ref 11.5–15.5)
WBC: 6.2 K/uL (ref 4.0–10.5)
nRBC: 0 % (ref 0.0–0.2)

## 2023-11-14 LAB — URINALYSIS, ROUTINE W REFLEX MICROSCOPIC
Bilirubin Urine: NEGATIVE
Glucose, UA: NEGATIVE mg/dL
Ketones, ur: NEGATIVE mg/dL
Nitrite: NEGATIVE
Protein, ur: NEGATIVE mg/dL
Specific Gravity, Urine: 1.009 (ref 1.005–1.030)
pH: 6 (ref 5.0–8.0)

## 2023-11-14 LAB — COMPREHENSIVE METABOLIC PANEL WITH GFR
ALT: 21 U/L (ref 0–44)
AST: 27 U/L (ref 15–41)
Albumin: 4.1 g/dL (ref 3.5–5.0)
Alkaline Phosphatase: 76 U/L (ref 38–126)
Anion gap: 8 (ref 5–15)
BUN: 16 mg/dL (ref 6–20)
CO2: 25 mmol/L (ref 22–32)
Calcium: 9.4 mg/dL (ref 8.9–10.3)
Chloride: 104 mmol/L (ref 98–111)
Creatinine, Ser: 0.88 mg/dL (ref 0.44–1.00)
GFR, Estimated: >60 mL/min (ref >60)
Glucose, Bld: 92 mg/dL (ref 70–99)
Potassium: 4.2 mmol/L (ref 3.5–5.1)
Sodium: 136 mmol/L (ref 135–145)
Total Bilirubin: 0.3 mg/dL (ref 0.0–1.2)
Total Protein: 7.4 g/dL (ref 6.5–8.1)

## 2023-11-14 LAB — LIPASE, BLOOD: Lipase: 42 U/L (ref 11–51)

## 2023-11-14 MED ORDER — CEPHALEXIN 500 MG PO CAPS: 500.0000 mg | ORAL_CAPSULE | Freq: Two times a day (BID) | ORAL | 0 refills | Status: DC

## 2023-11-14 MED ORDER — KETOROLAC TROMETHAMINE 15 MG/ML IJ SOLN
15.0000 mg | Freq: Once | INTRAMUSCULAR | Status: AC
  Administered 2023-11-14: 15 mg via INTRAMUSCULAR
  Filled 2023-11-14: qty 1

## 2023-11-14 MED ORDER — CEPHALEXIN 250 MG PO CAPS
500.0000 mg | ORAL_CAPSULE | Freq: Once | ORAL | Status: AC
  Administered 2023-11-14: 500 mg via ORAL
  Filled 2023-11-14: qty 2

## 2023-11-14 NOTE — Discharge Instructions (Addendum)
 Today you were seen for a urinary tract infection.  Please pick up your antibiotic and take as prescribed.  You may also alternate Tylenol  and Motrin  as needed for pain.  Thank you for letting us  treat you today. After reviewing your labs and imaging, I feel you are safe to go home. Please follow up with your PCP in the next several days and provide them with your records from this visit. Return to the Emergency Room if pain becomes severe or symptoms worsen.

## 2023-11-14 NOTE — ED Provider Notes (Signed)
 [place] EMERGENCY DEPARTMENT AT [facility] Provider Note   CSN: [id number] Arrival date & time: 11/14/23  1543     Patient presents with: Flank Pain (R)   Maria Logan is a 58 y.o. female presents today for urinary frequency and right sided flank pain/burning x 1 week.  Patient also endorses cloudy urine, fatigue, and chills.  Patient denies nausea, vomiting, fever, chest pain, shortness of breath, or any other complaints at this time.    Flank Pain       Prior to Admission medications   Medication Sig Start Date End Date Taking? Authorizing Provider  cephALEXin  (KEFLEX ) 500 MG capsule Take 1 capsule (500 mg total) by mouth 2 (two) times daily. 11/14/23  Yes Francis Ileana SAILOR, PA-C  APPLE CIDER VINEGAR PO Take 1 capsule by mouth daily as needed (for gut health).    [provider]  B Complex Vitamins (VITAMIN B COMPLEX) TABS Take 1 tablet by mouth daily.    [provider]  benzonatate  (TESSALON ) 100 MG capsule Take 1-2 capsules (100-200 mg total) by mouth every 8 (eight) hours as needed for cough. 12/17/19   Burky, Natalie B, NP  cetirizine  (ZYRTEC ) 10 MG tablet Take 1 tablet (10 mg total) by mouth daily. Patient not taking: Reported on 08/03/2022 12/17/19   Burky, Natalie B, NP  fluconazole  (DIFLUCAN ) 150 MG tablet Take 1 tablet (150 mg total) by mouth daily. 12/21/22   Alasha Mcguinness N, PA-C  gabapentin  (NEURONTIN ) 100 MG capsule Take 1 capsule (100 mg total) by mouth 3 (three) times daily. 06/02/20   Harris, Abigail, PA-C  HYDROcodone -acetaminophen  (NORCO/VICODIN) 5-325 MG tablet Take 2 tablets by mouth every 4 (four) hours as needed. 05/27/20   Laurice Maude BROCKS, MD  sertraline  (ZOLOFT ) 50 MG tablet Take 50 mg by mouth daily.    [provider]    Allergies: Patient has no known allergies.    Review of Systems  Constitutional:  Positive for chills and fatigue.  Genitourinary:  Positive for flank pain and frequency.    Updated Vital  Signs BP 124/75 (BP Location: Right Arm)   Pulse 67   Temp (!) 97.4 F (36.3 C)   Resp 15   Ht 5' 6 (1.676 m)   Wt 93 kg   LMP 12/09/2014 (Approximate)   SpO2 100%   BMI 33.09 kg/m   Physical Exam Vitals and nursing note reviewed.  Constitutional:      General: She is not in acute distress.    Appearance: She is well-developed. She is not toxic-appearing.  HENT:     Head: Normocephalic and atraumatic.     Right Ear: External ear normal.     Left Ear: External ear normal.     Nose: Nose normal.     Mouth/Throat:     Mouth: Mucous membranes are moist.     Pharynx: Oropharynx is clear.  Eyes:     Extraocular Movements: Extraocular movements intact.     Conjunctiva/sclera: Conjunctivae normal.  Cardiovascular:     Rate and Rhythm: Normal rate and regular rhythm.     Pulses: Normal pulses.     Heart sounds: Normal heart sounds. No murmur heard. Pulmonary:     Effort: Pulmonary effort is normal. No respiratory distress.     Breath sounds: Normal breath sounds.  Abdominal:     Palpations: Abdomen is soft.     Tenderness: There is no abdominal tenderness. There is right CVA tenderness. There is no left CVA tenderness.  Musculoskeletal:        General: No swelling.     Cervical back: Normal range of motion and neck supple.  Skin:    General: Skin is warm and dry.     Capillary Refill: Capillary refill takes less than 2 seconds.  Neurological:     General: No focal deficit present.     Mental Status: She is alert and oriented to person, place, and time.  Psychiatric:        Mood and Affect: Mood normal.     (all labs ordered are listed, but only abnormal results are displayed) Labs Reviewed  URINALYSIS, ROUTINE W REFLEX MICROSCOPIC - Abnormal; Notable for the following components:      Result Value   Color, Urine COLORLESS (*)    Hgb urine dipstick MODERATE (*)    Leukocytes,Ua TRACE (*)    Bacteria, UA RARE (*)    All other components within normal limits  URINE  CULTURE  LIPASE, BLOOD  COMPREHENSIVE METABOLIC PANEL WITH GFR  CBC    EKG: None  Radiology: CT Renal Stone Study Result Date: 11/14/2023 EXAM: CT ABDOMEN AND PELVIS WITHOUT CONTRAST 11/14/2023 09:17:19 PM TECHNIQUE: CT of the abdomen and pelvis was performed without the administration of intravenous contrast. Multiplanar reformatted images are provided for review. Automated exposure control, iterative reconstruction, and/or weight-based adjustment of the mA/kV was utilized to reduce the radiation dose to as low as reasonably achievable. COMPARISON: None available. CLINICAL HISTORY: Abdominal/flank pain, stone suspected. FINDINGS: LOWER CHEST: No acute abnormality. LIVER: The liver is unremarkable. GALLBLADDER AND BILE DUCTS: Gallbladder is unremarkable. No biliary ductal dilatation. SPLEEN: No acute abnormality. PANCREAS: No acute abnormality. ADRENAL GLANDS: No acute abnormality. KIDNEYS, URETERS AND BLADDER: Left lower pole cyst measures 4 cm and appears benign. Per consensus, no follow-up is needed for simple Bosniak type 1 and 2 renal cysts, unless the patient has a malignancy history or risk factors. No stones in the kidneys or ureters. No hydronephrosis. No perinephric or periureteral stranding. Urinary bladder is unremarkable. GI AND BOWEL: Stomach demonstrates no acute abnormality. Moderate stool burden throughout the colon. There is no bowel obstruction. PERITONEUM AND RETROPERITONEUM: No ascites. No free air. VASCULATURE: Aorta is normal in caliber. Scattered aortic atherosclerosis. LYMPH NODES: No lymphadenopathy. REPRODUCTIVE ORGANS: Prior hysterectomy. BONES AND SOFT TISSUES: No acute osseous abnormality. No focal soft tissue abnormality. IMPRESSION: 1. No acute findings in the abdomen or pelvis. Electronically signed by: Franky Crease MD 11/14/2023 09:20 PM EST RP Workstation: [id number]     Procedures   Medications Ordered in the ED  cephALEXin  (KEFLEX ) capsule 500 mg (has no  administration in time range)  ketorolac  (TORADOL ) 15 MG/ML injection 15 mg (15 mg Intramuscular Given 11/14/23 2108)                                    Medical Decision Making Amount and/or Complexity of Data Reviewed Labs: ordered.   This patient presents to the ED for concern of right-sided flank pain and urinary frequency differential diagnosis includes pyelonephritis, kidney stone, UTI, septic stone   Lab Tests:  I Ordered, and personally interpreted labs.  The pertinent results include: CMP unremarkable, lipase 42, CBC unremarkable, UA with moderate hemoglobin, trace leukocytes, rare bacteria, 11-20 WBCs Urine culture pending   Imaging: CT renal stone study which showed  Medicines ordered and prescription drug management:  I ordered medication including toradol  and keflex   I have reviewed the patients home medicines and have made adjustments as needed   Problem List / ED Course:  Consider for admission or further workup however patient's vital signs, physical exam, labs, and imaging are reassuring.  Patient symptoms most consistent with acute UTI.  Patient given first dose of Keflex  while in ED.  Patient given outpatient course of Keflex .  Patient given return precautions.  I feel patient is safe for discharge at this time.     Final diagnoses:  Urinary tract infection with hematuria, site unspecified    ED Discharge Orders          Ordered    cephALEXin  (KEFLEX ) 500 MG capsule  2 times daily        11/14/23 2123               Francis Ileana SAILOR, PA-C 11/14/23 2123    Dean Clarity, MD 11/14/23 2201

## 2023-11-14 NOTE — ED Triage Notes (Signed)
 Pt reports R sided flank pain/'burning', ongoing x1 week. Endorses urinary frequency, cloudy urine, chills, fatigue. Significant hx UTIs. Feels like a kidney infection, or like I have to poop and can't. Last BM this morning.

## 2023-11-17 LAB — URINE CULTURE: Culture: >=100000 — AB

## 2023-11-18 ENCOUNTER — Telehealth (HOSPITAL_BASED_OUTPATIENT_CLINIC_OR_DEPARTMENT_OTHER): Payer: Self-pay | Admitting: Emergency Medicine

## 2023-11-18 NOTE — Telephone Encounter (Signed)
 Post ED Visit - Positive Culture Follow-up  Culture report reviewed by antimicrobial stewardship pharmacist: [facility] []  Rankin Dee, Pharm.D. []  Venetia Gully, Pharm.D., BCPS AQ-ID []  Garrel Crews, Pharm.D., BCPS []  Almarie Lunger, Pharm.D., BCPS []  [place], [address].D., BCPS, AAHIVP []  Rosaline Bihari, Pharm.D., BCPS, AAHIVP []  Vernell Meier, PharmD, BCPS []  Latanya Hint, PharmD, BCPS []  Donald Medley, PharmD, BCPS [x]  Dorn Buttner, PharmD []  Dorothyann Alert, PharmD, BCPS []  Morene Babe, PharmD  [facility] []  Rosaline Edison, PharmD []  Romona Bliss, PharmD []  Dolphus Roller, PharmD []  Veva Seip, Rph []  Vernell Daunt) Leonce, PharmD []  Eva Allis, PharmD []  Rosaline Millet, PharmD []  Iantha Batch, PharmD []  Arvin Gauss, PharmD []  Wanda Hasting, PharmD []  Ronal Rav, PharmD []  Rocky Slade, PharmD []  Bard Jeans, PharmD   Positive urine culture Treated with Cephalexin , organism sensitive to the same and no further patient follow-up is required at this time.  Maria Logan 11/18/2023, 5:44 PM

## 2023-12-25 ENCOUNTER — Other Ambulatory Visit: Payer: Self-pay

## 2023-12-25 ENCOUNTER — Emergency Department (HOSPITAL_BASED_OUTPATIENT_CLINIC_OR_DEPARTMENT_OTHER)
Admission: EM | Admit: 2023-12-25 | Discharge: 2023-12-25 | Disposition: A | Discharge status: Home / Self Care | Payer: Self-pay | Attending: Emergency Medicine | Admitting: Emergency Medicine

## 2023-12-25 ENCOUNTER — Encounter (HOSPITAL_BASED_OUTPATIENT_CLINIC_OR_DEPARTMENT_OTHER): Payer: Self-pay | Admitting: Emergency Medicine

## 2023-12-25 DIAGNOSIS — N12 Tubulo-interstitial nephritis, not specified as acute or chronic: Secondary | ICD-10-CM

## 2023-12-25 LAB — URINALYSIS, W/ REFLEX TO CULTURE (INFECTION SUSPECTED)
Bilirubin Urine: NEGATIVE
Glucose, UA: NEGATIVE mg/dL
Ketones, ur: NEGATIVE mg/dL
Nitrite: NEGATIVE
Protein, ur: NEGATIVE mg/dL
Specific Gravity, Urine: 1.024 (ref 1.005–1.030)
pH: 5.5 (ref 5.0–8.0)

## 2023-12-25 MED ORDER — CEPHALEXIN 500 MG PO CAPS: 500.0000 mg | ORAL_CAPSULE | Freq: Three times a day (TID) | ORAL | 0 refills | Status: AC

## 2023-12-25 MED ORDER — KETOROLAC TROMETHAMINE 15 MG/ML IJ SOLN
15.0000 mg | Freq: Once | INTRAMUSCULAR | Status: AC
  Administered 2023-12-25: 19:00:00 15 mg via INTRAMUSCULAR
  Filled 2023-12-25: qty 1

## 2023-12-25 MED ORDER — CEPHALEXIN 250 MG PO CAPS
1000.0000 mg | ORAL_CAPSULE | Freq: Once | ORAL | Status: AC
  Administered 2023-12-25: 19:00:00 1000 mg via ORAL
  Filled 2023-12-25: qty 4

## 2023-12-25 NOTE — ED Notes (Signed)
 Reviewed AVS/discharge instruction with patient. Time allotted for and all questions answered. Patient is agreeable for d/c and escorted to ed exit by staff.

## 2023-12-25 NOTE — ED Triage Notes (Signed)
 Lower abdo pain, into back Malodorous urine X 1 weeks No relief with OTC meds

## 2023-12-25 NOTE — ED Provider Notes (Signed)
 [place] EMERGENCY DEPARTMENT AT [facility] Provider Note   CSN: [id number] Arrival date & time: 12/25/23  1551     Patient presents with: Abdominal Pain   Maria Logan is a 58 y.o. female.    Abdominal Pain  Patient is a 58 year old female with a past medical history significant for fibroids, umbilical hernia, hypertension   Patient presents emergency room today with complaints of 1 week of urinary frequency urgency and some lower abdominal pain that radiates into her back.  She states that this feels just like several other UTIs she has had recently.  Most recently had a UTI 11/4 had a CT renal stone study which was negative.  She feels frustrated that she has had several UTIs.    Prior to Admission medications  Medication Sig Start Date End Date Taking? Authorizing Provider  cephALEXin  (KEFLEX ) 500 MG capsule Take 1 capsule (500 mg total) by mouth 3 (three) times daily for 10 days. 12/25/23 01/04/24 Yes Margan Elias S, PA  APPLE CIDER VINEGAR PO Take 1 capsule by mouth daily as needed (for gut health).    [provider]  B Complex Vitamins (VITAMIN B COMPLEX) TABS Take 1 tablet by mouth daily.    [provider]  benzonatate  (TESSALON ) 100 MG capsule Take 1-2 capsules (100-200 mg total) by mouth every 8 (eight) hours as needed for cough. 12/17/19   Burky, Natalie B, NP  cetirizine  (ZYRTEC ) 10 MG tablet Take 1 tablet (10 mg total) by mouth daily. Patient not taking: Reported on 08/03/2022 12/17/19   Burky, Natalie B, NP  fluconazole  (DIFLUCAN ) 150 MG tablet Take 1 tablet (150 mg total) by mouth daily. 12/21/22   Keith, Kayla N, PA-C  gabapentin  (NEURONTIN ) 100 MG capsule Take 1 capsule (100 mg total) by mouth 3 (three) times daily. 06/02/20   Harris, Abigail, PA-C  HYDROcodone -acetaminophen  (NORCO/VICODIN) 5-325 MG tablet Take 2 tablets by mouth every 4 (four) hours as needed. 05/27/20   Laurice Maude BROCKS, MD  sertraline  (ZOLOFT ) 50 MG tablet Take  50 mg by mouth daily.    [provider]    Allergies: Patient has no known allergies.    Review of Systems  Gastrointestinal:  Positive for abdominal pain.    Updated Vital Signs BP 117/85 (BP Location: Right Arm)   Pulse 78   Temp 98 F (36.7 C)   Resp 16   LMP 12/09/2014   SpO2 100%   Physical Exam Vitals and nursing note reviewed.  Constitutional:      General: She is not in acute distress. HENT:     Head: Normocephalic and atraumatic.     Nose: Nose normal.  Eyes:     General: No scleral icterus. Cardiovascular:     Rate and Rhythm: Normal rate and regular rhythm.     Pulses: Normal pulses.     Heart sounds: Normal heart sounds.  Pulmonary:     Effort: Pulmonary effort is normal. No respiratory distress.     Breath sounds: No wheezing.  Abdominal:     Palpations: Abdomen is soft.     Tenderness: There is no abdominal tenderness.  Musculoskeletal:     Cervical back: Normal range of motion.     Right lower leg: No edema.     Left lower leg: No edema.  Skin:    General: Skin is warm and dry.     Capillary Refill: Capillary refill takes less than 2 seconds.  Neurological:     Mental Status:  She is alert. Mental status is at baseline.  Psychiatric:        Mood and Affect: Mood normal.        Behavior: Behavior normal.     (all labs ordered are listed, but only abnormal results are displayed) Labs Reviewed  URINALYSIS, W/ REFLEX TO CULTURE (INFECTION SUSPECTED) - Abnormal; Notable for the following components:      Result Value   Hgb urine dipstick LARGE (*)    Leukocytes,Ua MODERATE (*)    Bacteria, UA RARE (*)    All other components within normal limits  URINE CULTURE    EKG: None  Radiology: No results found.   Procedures   Medications Ordered in the ED  ketorolac  (TORADOL ) 15 MG/ML injection 15 mg (has no administration in time range)  cephALEXin  (KEFLEX ) capsule 1,000 mg (has no administration in time range)                                     Medical Decision Making Amount and/or Complexity of Data Reviewed Labs: ordered.  Risk Prescription drug management.    This patient presents to the ED for concern of abd pain, this involves a number of treatment options, and is a complaint that carries with it a moderate risk of complications and morbidity. A differential diagnosis was considered for the patient's symptoms which is discussed below:   The causes of generalized abdominal pain include but are not limited to AAA, mesenteric ischemia, appendicitis, diverticulitis, DKA, gastritis, gastroenteritis, AMI, nephrolithiasis, pancreatitis, peritonitis, adrenal insufficiency,lead poisoning, iron toxicity, intestinal ischemia, constipation, UTI,SBO/LBO, splenic rupture, biliary disease, IBD, IBS, PUD, or hepatitis. Ectopic pregnancy, ovarian torsion, PID.    Co morbidities: Discussed in HPI   Brief History:   Patient is a 58 year old female with a past medical history significant for fibroids, umbilical hernia, hypertension   Patient presents emergency room today with complaints of 1 week of urinary frequency urgency and some lower abdominal pain that radiates into her back.  She states that this feels just like several other UTIs she has had recently.  Most recently had a UTI 11/4 had a CT renal stone study which was negative.  She feels frustrated that she has had several UTIs.    EMR reviewed including pt PMHx, past surgical history and past visits to ER.   See HPI for more details   Lab Tests:  Urinalysis with hemoglobin moderate leukocytes rare bacteria and WBCs greater than 21 concerning for UTI obtain urine culture.  Offered additional labs which patient declines   Imaging Studies:  Offered to obtain CT abdomen pelvis without contrast which patient declines    Cardiac Monitoring:  NA NA   Medicines ordered:  I ordered medication including Keflex , Toradol  IM for antibiosis and  pain Reevaluation of the patient after these medicines showed that the patient improved I have reviewed the patients home medicines and have made adjustments as needed   Critical Interventions:     Consults/Attending Physician      Reevaluation:  After the interventions noted above I re-evaluated patient and found that they have :improved   Social Determinants of Health:      Problem List / ED Course:  Patient with pyelonephritis clinically I offered additional imaging, labs, saline lock and hydration and IV antibiosis she states she preferred to be treated.  We discussed her continued episodes of UTI.  I recommended she follow-up with  urology.   Dispostion:  After consideration of the diagnostic results and the patients response to treatment, I feel that the patent would benefit from outpatient follow-up.   Final diagnoses:  Pyelonephritis    ED Discharge Orders          Ordered    cephALEXin  (KEFLEX ) 500 MG capsule  3 times daily        12/25/23 1916               Neldon Hamp RAMAN, [place] 12/25/23 2232    Yolande Lamar BROCKS, MD 12/26/23 [phone]

## 2023-12-25 NOTE — Discharge Instructions (Addendum)
 Please follow up with urology for your recurrent UTIs.  Take keflex  four times daily for 10 days (sorry I said three times daily bfeore)  Please use Tylenol  or ibuprofen  for pain.  You may use 600 mg ibuprofen  every 6 hours or 1000 mg of Tylenol  every 6 hours.  You may choose to alternate between the 2.  This would be most effective.  Not to exceed 4 g of Tylenol  within 24 hours.  Not to exceed 3200 mg ibuprofen  24 hours.

## 2023-12-26 LAB — URINE CULTURE: Culture: NO GROWTH
